# Patient Record
Sex: Female | Born: 1995 | Race: White | Hispanic: No | Marital: Married | Smoking: Never smoker
Health system: Southern US, Community
[De-identification: ages and names within clinical notes are randomized; demographics above are authoritative.]

## PROBLEM LIST (undated history)

## (undated) DIAGNOSIS — F419 Anxiety disorder, unspecified: Secondary | ICD-10-CM

## (undated) DIAGNOSIS — N926 Irregular menstruation, unspecified: Secondary | ICD-10-CM

## (undated) DIAGNOSIS — Z8742 Personal history of other diseases of the female genital tract: Secondary | ICD-10-CM

## (undated) DIAGNOSIS — E079 Disorder of thyroid, unspecified: Secondary | ICD-10-CM

## (undated) DIAGNOSIS — F32A Depression, unspecified: Secondary | ICD-10-CM

## (undated) DIAGNOSIS — T7840XA Allergy, unspecified, initial encounter: Secondary | ICD-10-CM

## (undated) DIAGNOSIS — F329 Major depressive disorder, single episode, unspecified: Secondary | ICD-10-CM

## (undated) HISTORY — DX: Anxiety disorder, unspecified: F41.9

## (undated) HISTORY — DX: Major depressive disorder, single episode, unspecified: F32.9

## (undated) HISTORY — DX: Depression, unspecified: F32.A

## (undated) HISTORY — DX: Disorder of thyroid, unspecified: E07.9

## (undated) HISTORY — DX: Allergy, unspecified, initial encounter: T78.40XA

## (undated) HISTORY — DX: Irregular menstruation, unspecified: N92.6

## (undated) HISTORY — PX: TOTAL THYROIDECTOMY: SHX2547

## (undated) HISTORY — DX: Personal history of other diseases of the female genital tract: Z87.42

---
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
## 2014-12-07 LAB — CBC AND DIFFERENTIAL
HCT: 38 % (ref 36–46)
HEMOGLOBIN: 13.1 g/dL (ref 12.0–16.0)
Neutrophils Absolute: 4 /uL
Platelets: 389 10*3/uL (ref 150–399)
WBC: 7 10^3/mL

## 2014-12-07 LAB — BASIC METABOLIC PANEL
BUN: 13 mg/dL (ref 4–21)
Creatinine: 0.7 mg/dL (ref 0.5–1.1)
Glucose: 85 mg/dL
Potassium: 4.3 mmol/L (ref 3.4–5.3)
Sodium: 140 mmol/L (ref 137–147)

## 2014-12-22 LAB — TSH: TSH: 0 u[IU]/mL — AB (ref 0.41–5.90)

## 2014-12-29 ENCOUNTER — Other Ambulatory Visit: Payer: Self-pay | Admitting: Obstetrics and Gynecology

## 2014-12-29 DIAGNOSIS — E0590 Thyrotoxicosis, unspecified without thyrotoxic crisis or storm: Secondary | ICD-10-CM

## 2014-12-30 ENCOUNTER — Ambulatory Visit: Payer: Self-pay

## 2014-12-30 ENCOUNTER — Ambulatory Visit
Admission: RE | Admit: 2014-12-30 | Discharge: 2014-12-30 | Disposition: A | Discharge status: Home / Self Care | Payer: 59 | Source: Ambulatory Visit | Attending: Obstetrics and Gynecology | Admitting: Obstetrics and Gynecology

## 2014-12-30 DIAGNOSIS — E0590 Thyrotoxicosis, unspecified without thyrotoxic crisis or storm: Secondary | ICD-10-CM | POA: Insufficient documentation

## 2015-01-10 ENCOUNTER — Other Ambulatory Visit: Payer: Self-pay | Admitting: Endocrinology

## 2015-01-10 ENCOUNTER — Encounter: Payer: Self-pay | Admitting: Endocrinology

## 2015-01-10 ENCOUNTER — Ambulatory Visit (INDEPENDENT_AMBULATORY_CARE_PROVIDER_SITE_OTHER): Payer: 59 | Admitting: Endocrinology

## 2015-01-10 VITALS — BP 124/68 | HR 96 | Resp 14 | Ht 68.5 in | Wt 164.8 lb

## 2015-01-10 DIAGNOSIS — N926 Irregular menstruation, unspecified: Secondary | ICD-10-CM | POA: Diagnosis not present

## 2015-01-10 DIAGNOSIS — R946 Abnormal results of thyroid function studies: Secondary | ICD-10-CM | POA: Diagnosis not present

## 2015-01-10 DIAGNOSIS — R7989 Other specified abnormal findings of blood chemistry: Secondary | ICD-10-CM

## 2015-01-10 DIAGNOSIS — Z8639 Personal history of other endocrine, nutritional and metabolic disease: Secondary | ICD-10-CM | POA: Insufficient documentation

## 2015-01-10 LAB — HEPATIC FUNCTION PANEL
ALBUMIN: 4.4 g/dL (ref 3.5–5.2)
ALT: 38 U/L — ABNORMAL HIGH (ref 0–35)
AST: 24 U/L (ref 0–37)
Alkaline Phosphatase: 70 U/L (ref 47–119)
BILIRUBIN TOTAL: 0.6 mg/dL (ref 0.2–1.2)
Bilirubin, Direct: 0.1 mg/dL (ref 0.0–0.3)
TOTAL PROTEIN: 7 g/dL (ref 6.0–8.3)

## 2015-01-10 LAB — T3, FREE: T3 FREE: 8.8 pg/mL — AB (ref 2.3–4.2)

## 2015-01-10 LAB — T4, FREE: Free T4: 2.35 ng/dL — ABNORMAL HIGH (ref 0.60–1.60)

## 2015-01-10 NOTE — Patient Instructions (Signed)
Monitor for symptoms of hyperthyroidism- unintentional weight loss, diarrhea, feeling hot all the time, palpitations. If they occur more frequently, then visit doctor in [place].  Labs today. May consider starting Beta blocker base on labs and do further workup of hyperthyroidism after the trip in July. Please come back for a follow-up appointment in 6 weeks

## 2015-01-10 NOTE — Progress Notes (Signed)
Pre visit review using our clinic review tool, if applicable. No additional management support is needed unless otherwise documented below in the visit note. 

## 2015-01-10 NOTE — Assessment & Plan Note (Signed)
May or may not be related to recent abnormal thyroid levels, given that her periods have always been irregular. Continue BCPs for now.

## 2015-01-10 NOTE — Assessment & Plan Note (Signed)
Discussed with the patient regarding thyroid hormone physiology, symptoms and signs of hyperthyroidism. Discussed possible etiologies for low TSH could indicate early subclinical hyperthyroidism due to Graves disease versus transient changes due to thyroiditis. We also discussed effect of birth control pills on thyroid hormones.  She appears mildly hyperthyroid on exam today.  Update thyroid panel today with thyroid AB levels. Recent thyroid US did not show any nodules.  Patient is visiting [place] for a mission trip for the next 6 weeks. If her thyroid levels are stable as compared to last check, then will consider starting low dose beta blocker given the logistics and lack of time with getting the workup completed with a nuclear diagnostic scan. The workup can then be completed after the trip.   We discussed about possible modalities of treatment for the above conditions including antithyroid medication ( most of the times methimazole), radioactive iodine ablation or surgery.

## 2015-01-10 NOTE — Progress Notes (Signed)
Reason for visit: Low TSH/hyperthyroidism HPI  Debra [name] is a 19 y.o.-year-old female, referred by her PCP,  Ines Bloomer, Melody A, CNM, for evaluation for hyperthyroidism/low TSH. Here with mom.   The patient denies any prior personal history of thyroid problems. She does have family history of thyroid disorder, goiter s/p surgery in pat [place]. she denies any personal XRT exposure to her neck area. Patient denies any recent hospitalization, illness or major change in his medical history. No recent medication changes. Denies tenderness in neck area.  Denies any recent ingestion of seaweed/kelp or recent exposure to iv contrast dye. No recent steroid use or use of herbal supplements.    She started having fatigue, mood swings recently, lately in last 1 year, which were initially attributed to depression but when symptoms persisted this Spring, then labs checked and TSH was found to be suppressed x 2. Initial set of labs were done prior to birth control pills.Labs were repeated in early May, I don't have those results. She has always had irreg periods , and was recently started on BCPs 5 weeks ago to regulate them. LMP March 2016. Other symptoms include palps with anxiety, for which she uses occasional xanax   I reviewed pt's thyroid tests: Lab Results  Component Value Date   TSH 0.00* 12/22/2014    12/08/14: TSH 0.006, TT4 11.5, T3U 33%,FTI 3.8 TgAB<1 Remainder panel was normal for E2, Progesterone, PRL, testosterone  Thyroid US, [facility], 12/30/14 CLINICAL DATA: Hyperthyroidism  EXAM: THYROID ULTRASOUND  TECHNIQUE: Ultrasound examination of the thyroid gland and adjacent soft tissues was performed.  COMPARISON: None.  FINDINGS: Right thyroid lobe  Measurements: 4.0 x 1.3 x 1.3 cm. No nodules visualized. Mildly heterogeneous tissue.  Left thyroid lobe  Measurements: 4.3 x 1.0 x 1.3 cm. No nodules visualized. Mildly heterogeneous tissue.  Isthmus  Thickness: 3 mm.  No nodules visualized.  Lymphadenopathy  None visualized.  IMPRESSION: No evidence of thyroid nodule. Mildly heterogeneous glandular tissue. Normal size.   Other pertinent labs are as follows: No results found for: AST No results found for: ALT  Review of systems:  [ x ] complains of   [  ] denies  [  ] weight loss [  ]  tremors [ x ] palpitations -occ [  ] diarrhea [  ] increased anxiety [  ] muscle weakness  [  ] heat intolerance [ x ] fatigue  [  ] proptosis [  ] problems with eye closure or color vision. [  ] noticing any enlargement in size of thyroid [  ] lumps in neck [  ] dysphagia  [  ] change in voice [  ]  SOB when lying down  FH of paternal great-GM- goiter and then surgery I reviewed her chart and she also has a history of depression. I have reviewed the patient's past medical history, family and social history, surgical history, medications and allergies.  Past Medical History  Diagnosis Date  . Anxiety   . Depression   . Thyroid disease   . History of irregular menstrual cycles    Past Surgical History  Procedure Laterality Date  . Tonsillectomy and adenoidectomy  2012   Family History  Problem Relation Age of Onset  . Thyroid disease Other    History   Social History  . Marital Status: Single    Spouse Name: N/A  . Number of Children: N/A  . Years of Education: N/A   Occupational History  . Not on  file.   Social History Main Topics  . Smoking status: Never Smoker   . Smokeless tobacco: Never Used  . Alcohol Use: Yes  . Drug Use: No  . Sexual Activity: Not Currently   Other Topics Concern  . Not on file   Social History Narrative   No current outpatient prescriptions on file prior to visit.   No current facility-administered medications on file prior to visit.   Allergies  Allergen Reactions  . Sulfa Antibiotics Rash      ROS: Review of Systems:  complains of  [  ] denies General:   [  ] Recent weight change [ x ]  Fatigue  [  ] Loss of appetite Eyes: [  ]  Vision Difficulty [  ]  Eye pain ENT: [  ]  Hearing difficulty [  ]  Difficulty Swallowing CVS: [  ] Chest pain [ x ]  Palpitations/Irregular Heart beat [  ]  Shortness of breath lying flat [  ] Swelling of legs Resp: [  ] Frequent Cough [  x] Shortness of Breath  [  ]  Wheezing GI: [  ] Heartburn  [  ] Nausea or Vomiting  [  ] Diarrhea [  ] Constipation  [  ] Abdominal Pain GU: [  ]  Polyuria  [  ]  nocturia Bones/joints:  [  ]  Muscle aches  [  ] Joint Pain  [  ] Bone pain Skin/Hair/Nails: [  ]  Rash  [  ] New stretch marks [  ]  Itching [  ] Hair loss [  ]  Excessive hair growth Reproduction: [  ] Low sexual desire , [ x ]  Women: Menstrual cycle problems [  ]  Women: Breast Discharge [  ] Men: Difficulty with erections [  ]  Men: Enlarged Breasts CNS: [ x ] Frequent Headaches [  ] Blurry vision [  ] Tremors [  ] Seizures [  ] Loss of consciousness [  ] Localized weakness Endocrine: [  ]  Excess thirst [  ]  Feeling excessively hot [  ]  Feeling excessively cold Heme: [  ]  Easy bruising [  ]  Enlarged glands or lumps in neck Allergy: [  ]  Food allergies [  ] Environmental allergies  PE: BP 124/68 mmHg  Pulse 96  Resp 14  Ht 5' 8.5" (1.74 m)  Wt 164 lb 12 oz (74.73 kg)  BMI 24.68 kg/m2  SpO2 97% Wt Readings from Last 3 Encounters:  01/10/15 164 lb 12 oz (74.73 kg) (90 %*, Z = 1.26)   * Growth percentiles are based on CDC 2-20 Years data.    HEENT: [place]/AT, EOMI, no icterus, no proptosis, no chemosis, no mild lid lag, no retraction, eyes close completely Neck: thyroid gland - smooth, non-tender, no erythema, no tracheal deviation; negative Pemberton's sign; no lymphadenopathy; + bilateral thyroid bruits Lungs: good air entry, clear bilaterally Heart: S1&S2 normal, regular rate & rhythm; no murmurs, rubs or gallops Abd: soft, NT, ND, no HSM, +BS Ext: fine tremor in hands bilaterally, no edema, 2+ DP/PT pulses, good muscle mass Neuro:  normal gait, 2+ reflexes bilaterally, normal 5/5 strength, no proximal myopathy  Derm: no pretibial myxoedema/skin dryness   ASSESSMENT: 1. Low TSH/Hyperthyroidism   Problem List Items Addressed This Visit      Other   Low TSH level - Primary    Discussed with the patient regarding thyroid hormone physiology,  symptoms and signs of hyperthyroidism. Discussed possible etiologies for low TSH could indicate early subclinical hyperthyroidism due to Graves disease versus transient changes due to thyroiditis. We also discussed effect of birth control pills on thyroid hormones.  She appears mildly hyperthyroid on exam today.  Update thyroid panel today with thyroid AB levels. Recent thyroid US did not show any nodules.  Patient is visiting [place] for a mission trip for the next 6 weeks. If her thyroid levels are stable as compared to last check, then will consider starting low dose beta blocker given the logistics and lack of time with getting the workup completed with a nuclear diagnostic scan. The workup can then be completed after the trip.   We discussed about possible modalities of treatment for the above conditions including antithyroid medication ( most of the times methimazole), radioactive iodine ablation or surgery.        Relevant Orders   T3, free   T4, free   Thyroid Profile II   Hepatic function panel   Thyroid stimulating immunoglobulin   Thyroid peroxidase antibody   Irregular menses    May or may not be related to recent abnormal thyroid levels, given that her periods have always been irregular. Continue BCPs for now.       Relevant Orders   T3, free   T4, free   Thyroid Profile II   Hepatic function panel   Thyroid stimulating immunoglobulin   Thyroid peroxidase antibody     -RTC 6 weeks     Kamdin Follett [facility] 01/10/2015  1:58 PM

## 2015-01-11 ENCOUNTER — Telehealth: Payer: Self-pay

## 2015-01-11 ENCOUNTER — Other Ambulatory Visit (INDEPENDENT_AMBULATORY_CARE_PROVIDER_SITE_OTHER): Payer: 59

## 2015-01-11 ENCOUNTER — Telehealth: Payer: Self-pay | Admitting: Obstetrics and Gynecology

## 2015-01-11 DIAGNOSIS — N926 Irregular menstruation, unspecified: Secondary | ICD-10-CM | POA: Diagnosis not present

## 2015-01-11 DIAGNOSIS — R946 Abnormal results of thyroid function studies: Secondary | ICD-10-CM | POA: Diagnosis not present

## 2015-01-11 LAB — THYROID PROFILE II
FREE THYROXINE INDEX: 5.3 — AB (ref 1.2–4.9)
T3 TOTAL: 440 ng/dL — AB (ref 71–180)
T3 UPTAKE RATIO: 30 % (ref 24–39)
T4, Total: 17.6 ug/dL — ABNORMAL HIGH (ref 4.5–12.0)
TSH: 0.005 u[IU]/mL — AB (ref 0.450–4.500)

## 2015-01-11 LAB — THYROID PEROXIDASE ANTIBODY: THYROID PEROXIDASE ANTIBODY: 3 [IU]/mL (ref <9)

## 2015-01-11 LAB — HCG, QUANTITATIVE, PREGNANCY: QUANTITATIVE HCG: 0.21 m[IU]/mL

## 2015-01-11 NOTE — Telephone Encounter (Signed)
Could you add on a serum HCG test ( qualitative) to her labs from 2 days ago. If they cant be added on to the sample that we have here- then please add on [facility]. Thanks  Also the patient hasn't called us back regarding confirmation for this test to be done for tomorrow. Please could you reach her this morning and confirm that she will come.   Ask her to avoid all kinds of salt and salty foods including Congohinese for today and tomorrow.

## 2015-01-11 NOTE — Telephone Encounter (Signed)
Notified patient of appointment time and place.

## 2015-01-11 NOTE — Telephone Encounter (Signed)
Patient's mother called back and is now interested in going through with the diagnostic testing tomorrow morning.  If possible, she would like to speak with you about some concerns around the testing anytime after 3:30 today. [phone].

## 2015-01-11 NOTE — Telephone Encounter (Signed)
Neg preg test

## 2015-01-11 NOTE — Telephone Encounter (Signed)
Verbal consent obtained from [place] regarding permission to talk to her mom regarding this. LMOM for mom to call back.

## 2015-01-11 NOTE — Telephone Encounter (Signed)
[facility] nuclear med called and stated the patient would need a serum pregnancy test done before the order of her thyroid uptake could be completed.   Callback - [phone] (Addie)

## 2015-01-11 NOTE — Telephone Encounter (Signed)
Patient's mother is concerned about her daughter's results from yesterday.  Mother has a few questions regarding, 1. You mentioned a beta blocker yesterday as a possible treatment method, is that only after the nuclear scan or is that something you would start regardless? 2. Is the nuclear scan needed to be done now this week or is this something that can wait until she returns in 6 weeks?  Mom feels like they haven't given her new birth control pill time to assist yet and is nervous about adding more medications to her plans while she is out of the country.  Wants some clarity on if they wait, will it make a big difference or is it more a choice?  She wants your opinion.  Thanks Please advise.

## 2015-01-11 NOTE — Telephone Encounter (Signed)
Called Patient's mother back, see telephone note for details.

## 2015-01-11 NOTE — Telephone Encounter (Signed)
Spoke to mom at length. Verbal consent had been obtained by me earlier from the patient. 1. Pt has hyperthyroidism on labs. There is worsening of thyroid levels as compared to test done April 2016.  2. Usually diagnostic testing is without complications, cant predict allergies, but usually no major side effects 3. Options would be to workup this further now or start treatment and then consider diagnostic scan after trip 4. Wouldn't recommend to wait and watch without any treatment as thyroid levels have worsened, and I cant predict her trajectory over next 6 weeks without treatment. Plus dont know too much about her health insurance and access to care while in [place].  5. Discussed protocol for diagnostic scan. Following the results, will likely start her on methimazole and BB 6. We will inform patient about details of where she needs to go.  Denisa spoke to patient and she is agreeable to proceed with testing 7. Okay for grandparent to be with patient while testing 8. Precertification done by me through peer to peer review. coapy costs and such will be determined by her plan. 9. Usually nuc scan is a one time testing modality.

## 2015-01-11 NOTE — Telephone Encounter (Signed)
Awaiting confirmation from pt/mom regarding testing- see other notes

## 2015-01-11 NOTE — Telephone Encounter (Signed)
Pt mother called to ask questions about pt care for blood work.

## 2015-01-11 NOTE — Telephone Encounter (Signed)
Spoke with [facility] and test can be added on.  We will need to fax add on sheet with Dx code.

## 2015-01-12 ENCOUNTER — Encounter
Admission: RE | Admit: 2015-01-12 | Discharge: 2015-01-12 | Disposition: A | Discharge status: Home / Self Care | Payer: 59 | Source: Ambulatory Visit | Attending: Endocrinology | Admitting: Endocrinology

## 2015-01-12 ENCOUNTER — Inpatient Hospital Stay: Admission: RE | Admit: 2015-01-12 | Payer: 59 | Source: Ambulatory Visit

## 2015-01-12 ENCOUNTER — Encounter: Admission: RE | Admit: 2015-01-12 | Payer: 59 | Source: Ambulatory Visit

## 2015-01-12 DIAGNOSIS — R7989 Other specified abnormal findings of blood chemistry: Secondary | ICD-10-CM

## 2015-01-12 DIAGNOSIS — R946 Abnormal results of thyroid function studies: Secondary | ICD-10-CM | POA: Insufficient documentation

## 2015-01-12 MED ORDER — SODIUM IODIDE I-123 3.7 MBQ PO CAPS
148.3000 | ORAL_CAPSULE | Freq: Once | ORAL | Status: DC
  Filled 2015-01-12: qty 1

## 2015-01-13 ENCOUNTER — Encounter
Admission: RE | Admit: 2015-01-13 | Discharge: 2015-01-13 | Disposition: A | Discharge status: Home / Self Care | Payer: 59 | Source: Ambulatory Visit | Attending: Endocrinology | Admitting: Endocrinology

## 2015-01-13 ENCOUNTER — Other Ambulatory Visit: Payer: 59

## 2015-01-13 ENCOUNTER — Other Ambulatory Visit: Payer: Self-pay | Admitting: Endocrinology

## 2015-01-13 ENCOUNTER — Telehealth: Payer: Self-pay | Admitting: Obstetrics and Gynecology

## 2015-01-13 DIAGNOSIS — R946 Abnormal results of thyroid function studies: Secondary | ICD-10-CM | POA: Diagnosis present

## 2015-01-13 MED ORDER — METHIMAZOLE 10 MG PO TABS: 10.0000 mg | ORAL_TABLET | Freq: Every day | ORAL | Status: DC

## 2015-01-13 NOTE — Telephone Encounter (Signed)
Patient called and would like results if available on her nuclear scan yesterday.

## 2015-01-13 NOTE — Telephone Encounter (Signed)
Called patient to notify her that results were not available yet per Dr. Welford RochePhadke. Patient verbalized understanding. Explained to patient as soon as we have the results she will be contacted by phone even if its after hours. Dr. Welford RochePhadke is aware that patient will be leaving the country next week and need the results as soon as possible.

## 2015-01-13 NOTE — Telephone Encounter (Signed)
No official result yet.will let her know when they are out. thanks

## 2015-01-13 NOTE — Telephone Encounter (Signed)
Pt wanted to get test result. Please advise pt/msn

## 2015-01-15 LAB — THYROID STIMULATING IMMUNOGLOBULIN: TSI: 588 % baseline — ABNORMAL HIGH (ref <140)

## 2015-01-17 ENCOUNTER — Other Ambulatory Visit: Payer: 59

## 2015-02-22 ENCOUNTER — Ambulatory Visit: Payer: 59 | Admitting: Endocrinology

## 2015-02-22 ENCOUNTER — Telehealth: Payer: Self-pay | Admitting: Obstetrics and Gynecology

## 2015-02-22 NOTE — Telephone Encounter (Signed)
Pt called and she went to the endocrinologist and she was DX with graves disease and hypothyroid, she went to the practice were her endocrinologist was and he dr had left so she isn't sure what she needs to do. So she needs to know if we can set her up with another endocrinologist Dr, and she also wanted to talk to you about some other things.

## 2015-02-23 ENCOUNTER — Telehealth: Payer: Self-pay | Admitting: Obstetrics and Gynecology

## 2015-02-23 NOTE — Telephone Encounter (Signed)
I called [facility] and got patient scheduled with Dr Elvera LennoxGherghe 04/11/15 at 1:15. That is there first available. Patient wants to know what she should do until then. While she was on vacation she was having severe panic attacks. Please Advise.

## 2015-02-23 NOTE — Telephone Encounter (Signed)
She can call and ask to see replacing endocrinologist at [facility] since she is established there.  I would recommend staying there.

## 2015-02-23 NOTE — Telephone Encounter (Signed)
Patient called stating she is having problems with her endocrinologist.  She was seen by Dr Welford RochePhadke before she went on vacation and returned after to see the doctor and she had left [place]. Patient stated they did not set her up with another physician there. She would be ok staying with [facility] or going to [facility], just whomever can get her in first.

## 2015-02-27 ENCOUNTER — Telehealth: Payer: Self-pay | Admitting: Internal Medicine

## 2015-02-27 NOTE — Telephone Encounter (Signed)
Notified pt of appt, states she has been having increased anxiety attacks, pls advise

## 2015-02-27 NOTE — Telephone Encounter (Signed)
Called and spoke with pt's mother. Pt saw Dr Welford RochePhadke in May. Pt left for a missions trip to [place] and has just returned. Pt's sx caused her to have to see a physician while in [place]. Pt has racing heart, is jittery, shaky hands and real hot at times. Mother feels that the pt's medication needs to be adjusted. She wanted her to see Dr Elvera LennoxGherghe as soon as possible to establish with her and to have labs done. Scheduled pt to be seen on Wed, 7/13 at 10:00 am. Be advised.

## 2015-02-27 NOTE — Telephone Encounter (Signed)
Pt mother called concerns about pt and some issues pt is having and also no medicatoin please call mother back asap at [phone]

## 2015-02-28 NOTE — Telephone Encounter (Signed)
Please have her make an appt to come in to discuss med for anxiety

## 2015-03-01 ENCOUNTER — Encounter: Payer: Self-pay | Admitting: Internal Medicine

## 2015-03-01 ENCOUNTER — Ambulatory Visit (INDEPENDENT_AMBULATORY_CARE_PROVIDER_SITE_OTHER): Payer: 59 | Admitting: Internal Medicine

## 2015-03-01 VITALS — BP 108/62 | HR 100 | Temp 98.3°F | Resp 12 | Wt 171.6 lb

## 2015-03-01 DIAGNOSIS — R7989 Other specified abnormal findings of blood chemistry: Secondary | ICD-10-CM

## 2015-03-01 DIAGNOSIS — R946 Abnormal results of thyroid function studies: Secondary | ICD-10-CM | POA: Diagnosis not present

## 2015-03-01 LAB — TSH: TSH: 0.19 u[IU]/mL — AB (ref 0.40–5.00)

## 2015-03-01 LAB — T4, FREE: Free T4: 1.75 ng/dL — ABNORMAL HIGH (ref 0.60–1.60)

## 2015-03-01 LAB — T3, FREE: T3, Free: 6.7 pg/mL — ABNORMAL HIGH (ref 2.3–4.2)

## 2015-03-01 MED ORDER — ATENOLOL 25 MG PO TABS: 25.0000 mg | ORAL_TABLET | Freq: Two times a day (BID) | ORAL | Status: DC

## 2015-03-01 NOTE — Progress Notes (Signed)
Patient ID: Debra [name], female   DOB: September 01, 1995, 19 y.o.   MRN: [id number]    HPI  Debra [name] is a 19 y.o.-year-old female, returning to [facility] (previously seen by Dr Welford RochePhadke), for follow up for Graves ds.  She started to have mood swings and anxiety episodes in 2014. More recently having heat intolerance. She has been having tremors for the last 3 years. She started to have heart racing, palpitations, fatigue, feeling hot in the last few months.  She started MMI 10 mg in am on 01/14/2015.   In the last month, she was in [place] >> had 3 panic attacks there.   I reviewed pt's thyroid tests: 01/10/2015: [facility] Lab Results  Component Value Date   TSH 0.005* 01/10/2015   TSH 0.00* 12/22/2014   FREET4 2.35* 01/10/2015    12/08/14: TSH 0.006, TT4 11.5, T3U 33%,FTI 3.8 TgAB<1 Remainder panel was normal for E2, Progesterone, PRL, testosterone  Thyroid US, [facility] (12/30/14) - normal, except heterogeneity: Right thyroid lobe Measurements: 4.0 x 1.3 x 1.3 cm. No nodules visualized. Mildly heterogeneous tissue.  Left thyroid lobe Measurements: 4.3 x 1.0 x 1.3 cm. No nodules visualized. Mildly heterogeneous tissue.  Isthmus Thickness: 3 mm. No nodules visualized.  Lymphadenopathy: None visualized.  IMPRESSION: No evidence of thyroid nodule. Mildly heterogeneous glandular tissue. Normal size.  Thyroid Uptake and scan (01/13/2015): The thyroid scan demonstrates fairly symmetric and homogeneous uptake in the thyroid gland. No hot or cold nodules are demonstrated.  4 hour I 131 uptake = 51.2% (normal 5-20%)  24 hour I 131 uptake = 55.8% (normal 10-30%)  IMPRESSION: Elevated 4 and 24 hr iodine 123 uptake consistent with hyperthyroidism/Graves disease.  Pt denies feeling nodules in neck, hoarseness, dysphagia/odynophagia, SOB with lying down; she c/o: - + fatigue - + excessive sweating/heat intolerance - + tremors - + anxiety - + palpitations - no  hyperdefecation - no weight loss - no hair loss  Pt does have a FH of thyroid ds. - hyper- or hypo-thyroidism in [place]. No FH of thyroid cancer. No h/o radiation tx to head or neck.  No seaweed or kelp, no recent contrast studies. No steroid use. No herbal supplements. No Biotin use.  I reviewed her chart and she also has a history of irregular menses.  ROS: Constitutional: see HPI Eyes: no blurry vision, no xerophthalmia ENT: no sore throat, no nodules palpated in throat, no dysphagia/odynophagia, no hoarseness Cardiovascular: + CP when anxious/+ SOB when anxious/+ palpitations/+ leg swelling Respiratory: no cough/+ SOB Gastrointestinal: no N/V/D/C Musculoskeletal: no muscle/joint aches Skin: no rashes Neurological: + tremors/no numbness/tingling/dizziness Psychiatric: no depression/+++anxiety + irreg menses Past Medical History  Diagnosis Date  . Anxiety   . Depression   . Thyroid disease   . History of irregular menstrual cycles    Past Surgical History  Procedure Laterality Date  . Tonsillectomy and adenoidectomy  2012   History   Social History  . Marital Status: Single    Spouse Name: N/A  . Number of Children: 0   Occupational History  . student   Social History Main Topics  . Smoking status: Never Smoker   . Smokeless tobacco: Never Used  . Alcohol Use: Yes  . Drug Use: No   Current Outpatient Prescriptions on File Prior to Visit  Medication Sig Dispense Refill  . methimazole (TAPAZOLE) 10 MG tablet Take 1 tablet (10 mg total) by mouth daily. 60 tablet 1  . Norethindrone-Ethinyl Estradiol-Fe Biphas (LO LOESTRIN FE) 1  MG-10 MCG / 10 MCG tablet Take 1 tablet by mouth daily.     No current facility-administered medications on file prior to visit.   Allergies  Allergen Reactions  . Sulfa Antibiotics Rash   Family History  Problem Relation Age of Onset  . Thyroid disease Other     PE: BP 108/62 mmHg  Pulse 100  Temp(Src) 98.3 F (36.8 C) (Oral)   Resp 12  Wt 171 lb 9.6 oz (77.837 kg)  SpO2 95% Body mass index is 25.71 kg/(m^2). Wt Readings from Last 3 Encounters:  03/01/15 171 lb 9.6 oz (77.837 kg) (92 %*, Z = 1.42)  01/10/15 164 lb 12 oz (74.73 kg) (90 %*, Z = 1.26)   * Growth percentiles are based on CDC 2-20 Years data.   Constitutional: normal weight, in NAD Eyes: PERRLA, EOMI, no exophthalmos, no lid lag, no stare ENT: moist mucous membranes, no thyromegaly, no thyroid bruits, no cervical lymphadenopathy Cardiovascular: tachycardia, RR, No MRG, + mild periankle edema, nonpitting Respiratory: CTA B Gastrointestinal: abdomen soft, NT, ND, BS+ Musculoskeletal: no deformities, strength intact in all 4 Skin: moist, warm, no rashes Neurological: + tremor with outstretched hands, DTR normal in all 4  ASSESSMENT: 1. Graves ds  PLAN:  1. Patient with a recently dx'ed Graves ds., with thyrotoxic sxs: fatigue, heat intolerance, tremors, palpitations, anxiety.  - I reviewed all her imaging and blood tests with pt and her mother >> explained what Graves ds means (autoimmune ds) - I suggested that we check the TSH, fT3 and fT4 today - we discussed about possible modalities of treatment for the above conditions, to include methimazole use, radioactive iodine ablation or (last resort) surgery. Will continue MMI for now >> switch to 5 mg bid. - we need to add a beta blocker (Atenolol 25 mg bid) today since she is tachycardic, anxious, or tremulous - I advised her to join my chart to communicate easier - RTC in 3 months, but likely sooner for repeat labs  - time spent with the patient: 40 min, of which >50% was spent in obtaining information about her symptoms, reviewing her previous labs, evaluations, and treatments, counseling her about her condition (please see the discussed topics above), and developing a plan to further investigate and treat it; she and her mom had a number of questions which I addressed.  Office Visit on 03/01/2015   Component Date Value Ref Range Status  . TSH 03/01/2015 0.19* 0.40 - 5.00 uIU/mL Final  . Free T4 03/01/2015 1.75* 0.60 - 1.60 ng/dL Final  . T3, Free 16/05/9603 6.7* 2.3 - 4.2 pg/mL Final   Thyroid tests much improved on the current dose of methimazole. We added a beta blocker, and for now, will continue 5 mg methimazole twice a day and recheck her thyroid test in a month. At that point, we may need to increase the dose. I would not recommend radioactive iodine for now since she appears to respond well to methimazole, even on this low dose.

## 2015-03-01 NOTE — Patient Instructions (Signed)
Please stop at the lab.  Please continue Methimazole but change to 5 mg 2x a day, with meals.   Start Atenolol 25 mg 2x a day.  Hyperthyroidism The thyroid is a large gland located in the lower front part of your neck. The thyroid helps control metabolism. Metabolism is how your body uses food. It controls metabolism with the hormone thyroxine. When the thyroid is overactive, it produces too much hormone. When this happens, these following problems may occur:   Nervousness  Heat intolerance  Weight loss (in spite of increase food intake)  Diarrhea  Change in hair or skin texture  Palpitations (heart skipping or having extra beats)  Tachycardia (rapid heart rate)  Loss of menstruation (amenorrhea)  Shaking of the hands CAUSES  Grave's Disease (the immune system attacks the thyroid gland). This is the most common cause.  Inflammation of the thyroid gland.  Tumor (usually benign) in the thyroid gland or elsewhere.  Excessive use of thyroid medications (both prescription and 'natural').  Excessive ingestion of Iodine. DIAGNOSIS  To prove hyperthyroidism, your caregiver may do blood tests and ultrasound tests. Sometimes the signs are hidden. It may be necessary for your caregiver to watch this illness with blood tests, either before or after diagnosis and treatment. TREATMENT Short-term treatment There are several treatments to control symptoms. Drugs called beta blockers may give some relief. Drugs that decrease hormone production will provide temporary relief in many people. These measures will usually not give permanent relief. Definitive therapy There are treatments available which can be discussed between you and your caregiver which will permanently treat the problem. These treatments range from surgery (removal of the thyroid), to the use of radioactive iodine (destroys the thyroid by radiation), to the use of antithyroid drugs (interfere with hormone synthesis). The  first two treatments are permanent and usually successful. They most often require hormone replacement therapy for life. This is because it is impossible to remove or destroy the exact amount of thyroid required to make a person euthyroid (normal). HOME CARE INSTRUCTIONS  See your caregiver if the problems you are being treated for get worse. Examples of this would be the problems listed above. SEEK MEDICAL CARE IF: Your general condition worsens. MAKE SURE YOU:   Understand these instructions.  Will watch your condition.  Will get help right away if you are not doing well or get worse. Document Released: 08/05/2005 Document Revised: 10/28/2011 Document Reviewed: 12/17/2006 [facility] Patient Information 2015 [place]. This information is not intended to replace advice given to you by your health care provider. Make sure you discuss any questions you have with your health care provider.

## 2015-03-02 ENCOUNTER — Other Ambulatory Visit: Payer: Self-pay | Admitting: Internal Medicine

## 2015-03-02 DIAGNOSIS — E0500 Thyrotoxicosis with diffuse goiter without thyrotoxic crisis or storm: Secondary | ICD-10-CM

## 2015-03-02 MED ORDER — METHIMAZOLE 10 MG PO TABS: 5.0000 mg | ORAL_TABLET | Freq: Two times a day (BID) | ORAL | Status: DC

## 2015-03-09 ENCOUNTER — Encounter: Payer: Self-pay | Admitting: *Deleted

## 2015-03-14 ENCOUNTER — Ambulatory Visit: Payer: Self-pay | Admitting: Obstetrics and Gynecology

## 2015-03-24 ENCOUNTER — Ambulatory Visit (INDEPENDENT_AMBULATORY_CARE_PROVIDER_SITE_OTHER): Payer: 59 | Admitting: Obstetrics and Gynecology

## 2015-03-24 ENCOUNTER — Encounter: Payer: Self-pay | Admitting: Obstetrics and Gynecology

## 2015-03-24 VITALS — BP 120/82 | HR 88 | Ht 69.0 in | Wt 168.3 lb

## 2015-03-24 DIAGNOSIS — F418 Other specified anxiety disorders: Secondary | ICD-10-CM | POA: Diagnosis not present

## 2015-03-24 DIAGNOSIS — F064 Anxiety disorder due to known physiological condition: Secondary | ICD-10-CM

## 2015-03-24 DIAGNOSIS — N926 Irregular menstruation, unspecified: Secondary | ICD-10-CM

## 2015-03-24 MED ORDER — NORETHIN-ETH ESTRAD-FE BIPHAS 1 MG-10 MCG / 10 MCG PO TABS: 1.0000 | ORAL_TABLET | Freq: Every day | ORAL | Status: DC

## 2015-03-24 NOTE — Progress Notes (Signed)
Subjective:     Patient ID: Debra [name], female   DOB: 1996/06/15, 19 y.o.   MRN: [id number]  HPI Diagnosed with hyperthyroidism earlier this year, is managed by endocrinologist; has been taking OCP for cycle control x 3 months. Here for follow-up on medication.  Review of Systems Reports no menses since starting Lo Lo Estrin and no unwanted side-effects. Happy with pill.   Does report weight fluctuations of 10-15 lbs with no changes in exercise or eating since thyroid disorder was diagnosed.  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   Also reports worsening anxiety over the last few months, now occuring daily, feels like insides are jittery and like she can't relax. Was started on atenolol 2 weeks ago to help with it but not feeling any different. States she has always been anxious when stressed, but now it is all the time. Sleeping not affected. Is able to preform ADLs and be in social situations as normal.  Is leaving for college in 1 week and concerned as to how she when adjust with the anxiety so present and the thyroiditis undergoing treatment.  Did discuss with endocrinologist.    Objective:   Physical Exam A&O x4 Well groomed female in no distress Able to articulate well with out suicidal ideation. Good recall of past and current events.     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  Debra [name], CNM

## 2015-03-24 NOTE — Patient Instructions (Signed)
Generalized Anxiety Disorder Generalized anxiety disorder (GAD) is a mental disorder. It interferes with life functions, including relationships, work, and school. GAD is different from normal anxiety, which everyone experiences at some point in their lives in response to specific life events and activities. Normal anxiety actually helps us prepare for and get through these life events and activities. Normal anxiety goes away after the event or activity is over.  GAD causes anxiety that is not necessarily related to specific events or activities. It also causes excess anxiety in proportion to specific events or activities. The anxiety associated with GAD is also difficult to control. GAD can vary from mild to severe. People with severe GAD can have intense waves of anxiety with physical symptoms (panic attacks).  SYMPTOMS The anxiety and worry associated with GAD are difficult to control. This anxiety and worry are related to many life events and activities and also occur more days than not for 6 months or longer. People with GAD also have three or more of the following symptoms (one or more in children):  Restlessness.   Fatigue.  Difficulty concentrating.   Irritability.  Muscle tension.  Difficulty sleeping or unsatisfying sleep. DIAGNOSIS GAD is diagnosed through an assessment by your health care provider. Your health care provider will ask you questions aboutyour mood,physical symptoms, and events in your life. Your health care provider may ask you about your medical history and use of alcohol or drugs, including prescription medicines. Your health care provider may also do a physical exam and blood tests. Certain medical conditions and the use of certain substances can cause symptoms similar to those associated with GAD. Your health care provider may refer you to a mental health specialist for further evaluation. TREATMENT The following therapies are usually used to treat GAD:    Medication. Antidepressant medication usually is prescribed for long-term daily control. Antianxiety medicines may be added in severe cases, especially when panic attacks occur.   Talk therapy (psychotherapy). Certain types of talk therapy can be helpful in treating GAD by providing support, education, and guidance. A form of talk therapy called cognitive behavioral therapy can teach you healthy ways to think about and react to daily life events and activities.  Stress managementtechniques. These include yoga, meditation, and exercise and can be very helpful when they are practiced regularly. A mental health specialist can help determine which treatment is best for you. Some people see improvement with one therapy. However, other people require a combination of therapies. Document Released: 11/30/2012 Document Revised: 12/20/2013 Document Reviewed: 11/30/2012 Exit[employer] Patient Information 2015 [employer]. This information is not intended to replace advice given to you by your health care provider. Make sure you discuss any questions you have with your health care provider.  

## 2015-04-11 ENCOUNTER — Ambulatory Visit: Payer: 59 | Admitting: Internal Medicine

## 2015-04-26 ENCOUNTER — Telehealth: Payer: Self-pay | Admitting: Internal Medicine

## 2015-04-26 NOTE — Telephone Encounter (Signed)
Please leave message below.

## 2015-04-26 NOTE — Telephone Encounter (Signed)
Pt called wanting to talk to dr Elvera Lennox, says she having issues with her diagnosis and would like to talk to her [phone]

## 2015-04-27 NOTE — Telephone Encounter (Signed)
She has a diagnosis of Graves' disease, which is an autoimmune disorder which stimulate the thyroid to produce excessive levels of thyroid hormones. Can you please find out what her questions are and I will do my best to answer them.

## 2015-04-27 NOTE — Telephone Encounter (Signed)
Returned pt's call. Pt stated that her sx have been escalating. She has been exhausted and her anxiety gotten worse. Advised pt that she needs to have her thyroid labs done. Pt to go to [place] lab tomorrow and do labs. Be advised.

## 2015-04-28 ENCOUNTER — Other Ambulatory Visit (INDEPENDENT_AMBULATORY_CARE_PROVIDER_SITE_OTHER): Payer: 59

## 2015-04-28 ENCOUNTER — Other Ambulatory Visit: Payer: Self-pay | Admitting: Internal Medicine

## 2015-04-28 DIAGNOSIS — E0500 Thyrotoxicosis with diffuse goiter without thyrotoxic crisis or storm: Secondary | ICD-10-CM

## 2015-04-28 LAB — T3, FREE: T3, Free: 8.3 pg/mL — ABNORMAL HIGH (ref 2.3–4.2)

## 2015-04-28 LAB — TSH: TSH: 0.14 u[IU]/mL — AB (ref 0.40–5.00)

## 2015-04-28 LAB — T4, FREE: Free T4: 2.34 ng/dL — ABNORMAL HIGH (ref 0.60–1.60)

## 2015-04-28 MED ORDER — METHIMAZOLE 10 MG PO TABS: 10.0000 mg | ORAL_TABLET | Freq: Two times a day (BID) | ORAL | Status: DC

## 2015-05-16 ENCOUNTER — Telehealth: Payer: Self-pay | Admitting: Internal Medicine

## 2015-05-16 NOTE — Telephone Encounter (Signed)
Please read message below.  

## 2015-05-16 NOTE — Telephone Encounter (Signed)
Mom is calling and would like some follow up on what might be best for her daughter to help with her anxiety  Anxiety meds were discussed with Aleece before she was even diagnosed with Graves disease, if this could be what is needed what is the mildest form of anxiety meds she could take

## 2015-05-16 NOTE — Telephone Encounter (Signed)
Please check with PCP, I am not usually managing anxiety and I am not completely familiar with the meds.

## 2015-05-16 NOTE — Telephone Encounter (Signed)
Called pt's mother and advised her per Dr Charlean Sanfilippo message below.

## 2015-05-16 NOTE — Telephone Encounter (Signed)
I think from the thyroid point of view, she should feel better, especially after increasing the methimazole at last visit. Please make sure that she is continuing her beta blocker twice a day. She may benefit from anxiety medication, not long-term, but at least until we get her thyroid tests under control, since there are not a lot of other things that we can do for her thyroid at this time. I would check with her PCP for anti-anxiety medicines. Please also advised her to come for another thyroid check around the middle of October, but it is too soon to recheck now.

## 2015-05-16 NOTE — Telephone Encounter (Signed)
Called pt and advised her per Dr Charlean Sanfilippo message below. Pt stated she is feeling better. She stated she can't sleep at night, she gets short of breath and her heart is racing at times. She does have some anti-anxiety meds she can take. Pt will call back and advise what the medication is. Be advised.

## 2015-05-16 NOTE — Telephone Encounter (Signed)
Pt is experiencing bad anxiety, she feels like she cant catch her breath, she feels like her heart is racing. Please advise

## 2015-05-31 ENCOUNTER — Other Ambulatory Visit (INDEPENDENT_AMBULATORY_CARE_PROVIDER_SITE_OTHER): Payer: 59

## 2015-05-31 DIAGNOSIS — E0500 Thyrotoxicosis with diffuse goiter without thyrotoxic crisis or storm: Secondary | ICD-10-CM | POA: Diagnosis not present

## 2015-05-31 LAB — TSH: TSH: 0.2 u[IU]/mL — ABNORMAL LOW (ref 0.35–5.50)

## 2015-05-31 LAB — T4, FREE: Free T4: 2.47 ng/dL — ABNORMAL HIGH (ref 0.60–1.60)

## 2015-05-31 LAB — T3, FREE: T3, Free: 10 pg/mL — ABNORMAL HIGH (ref 2.3–4.2)

## 2015-06-01 ENCOUNTER — Ambulatory Visit: Payer: 59 | Admitting: Internal Medicine

## 2015-06-01 ENCOUNTER — Encounter: Payer: Self-pay | Admitting: Internal Medicine

## 2015-06-01 ENCOUNTER — Ambulatory Visit (INDEPENDENT_AMBULATORY_CARE_PROVIDER_SITE_OTHER): Payer: 59 | Admitting: Internal Medicine

## 2015-06-01 VITALS — BP 118/60 | HR 99 | Temp 98.4°F | Resp 12 | Wt 169.4 lb

## 2015-06-01 DIAGNOSIS — E0500 Thyrotoxicosis with diffuse goiter without thyrotoxic crisis or storm: Secondary | ICD-10-CM

## 2015-06-01 MED ORDER — ATENOLOL 25 MG PO TABS: 25.0000 mg | ORAL_TABLET | Freq: Two times a day (BID) | ORAL | Status: DC

## 2015-06-01 NOTE — Patient Instructions (Signed)
Please increase Methimazole to 20 mg 2x a day. Come back for labs in 4 weeks. Please come back for a follow-up appointment in 3 months.   Radioiodine (I-131) Therapy for Hyperthyroidism Radioiodine (I-131) therapy for hyperthyroidism is a procedure used to treat an overactive thyroid (hyperthyroidism). The patient swallows I-131, which is a radioactive form of iodine. The I-131 destroys thyroid cells. The thyroid is a gland in the neck. It makes thyroid hormones, which control how cells throughout the body use energy. Hyperthyroidism is usually caused by Grave's disease or by growths within the thyroid (nodules). Symptoms may include:  Nervousness.  Irritability.  Problems with sleep.  Tiredness.  Fast heart rate.  Shaky hands.  Sweating.  Heat sensitivity.  Unintended weight loss.  Brittle hair.  Enlarged thyroid gland.  Menstrual changes.  Frequent stools. LET YOUR CAREGIVER KNOW ABOUT:   All allergies.  All medications that you are taking, including over-the-counter and prescription drugs, dietary supplements, vitamins, or herbal preparations.  Any previous complications from this or other procedures.  Smoking history.  Possibility of pregnancy.  History of bleeding problems.  Any other health problems. RISKS AND COMPLICATIONS  Risks of the procedure include:  Slight pain in the area of the thyroid gland. BEFORE THE PROCEDURE  If you are a woman, you may be asked to have pregnancy testing before the procedure.  If you have been taking thyroid medicines, you will usually be asked to stop them three days before your procedure.  You will usually be asked to stop eating and drinking at midnight the day of your procedure.  You will need to plan for someone to drive you home after the procedure. PROCEDURE  You will be asked to swallow the radioactive iodine in either pill or liquid form. It can take 1-3 months for the treatment to work. The treatment is  most effective at about 3-6 months after the dose of iodine has been given. In most people, a single dose of radioactive iodine resolves their hyperthyroidism, but a few people will require a second dose.  AFTER THE PROCEDURE  Because you will be giving off tiny amounts of radiation for several days, there are some special precautions you will be asked to follow for about 2-4 days after the procedure:  Avoid being around babies or pregnant women.  Do not use public bathrooms.  Flush twice after using the toilet.  Take a bath or shower every day.  Practice good hand washing.  Drink fluids normally.  Use disposable utensils, or clean your utensils separately from those of others.  Sleep alone.  Do not engage in intimate contact.  Wash your sheets, towels, and clothes each day, by themselves.  Do not make food for other people. Other precautions include:  Stopping breastfeeding.  Do not attempt to become pregnant for at least a year after you have had the procedure.  When traveling, bring a letter of explanation from your caregiver for three months. Radioactivity may trip detectors in airports or other places. Because the point of the procedure is to destroy your thyroid gland, you will need to take thyroid hormone by mouth for the rest of your life. HOME CARE INSTRUCTIONS   Ask your caregiver when you should resume or start thyroid medications.  Take all medications exactly as directed.  Follow any prescribed diet.  Follow instructions regarding both rest and physical activity. SEEK IMMEDIATE MEDICAL CARE IF:  You have a dry mouth.  You have a sore throat.  You have  neck pain.  You have a tight sensation in the throat.  You have nausea and vomiting.  You are fatigued.  You have flushing.  You have bowel changes (either diarrhea or constipation).   This information is not intended to replace advice given to you by your health care provider. Make sure you  discuss any questions you have with your health care provider.   Document Released: 12/22/2008 Document Revised: 10/28/2011 Document Reviewed: 11/30/2014 Elsevier Interactive Patient Education [employer]

## 2015-06-01 NOTE — Progress Notes (Signed)
Patient ID: Debra [name], female   DOB: 1996-01-12, 19 y.o.   MRN: [id number]    HPI  Debra [name] is a 19 y.o.-year-old female, returning for follow up for Graves ds. Last visit 3 months ago.  I reviewed and addended history: She started to have mood swings and anxiety episodes in 2014. More recently having heat intolerance. She has been having tremors for the last 3 years. She started to have heart racing, palpitations, fatigue, feeling hot in the last few months.  She started MMI 10 mg in am in 12/2014,  And we decreased the dose to 5 mg twice a day, as her TFTs were improving  In 02/2015. However, in 04/2015, her TFTs were still abnormal >> increased MMI to 10 mg 2x a day.  We also started atenolol 25 mg twice a day in 02/2015.  She had anxiety attacks in the past, and continues to have anxiety.  I reviewed pt's thyroid tests - last set from yesterday:  Lab Results  Component Value Date   TSH 0.20* 05/31/2015   TSH 0.14* 04/28/2015   TSH 0.19* 03/01/2015   TSH 0.005* 01/10/2015   TSH 0.00* 12/22/2014   FREET4 2.47* 05/31/2015   FREET4 2.34* 04/28/2015   FREET4 1.75* 03/01/2015   FREET4 2.35* 01/10/2015  01/10/2015: TSI 588 12/08/14: TSH 0.006, TT4 11.5, T3U 33%,FTI 3.8 TgAB<1 Remainder panel was normal for E2, Progesterone, PRL, testosterone  Thyroid US, [facility] (12/30/14) - normal, except heterogeneity: Right thyroid lobe Measurements: 4.0 x 1.3 x 1.3 cm. No nodules visualized. Mildly heterogeneous tissue.  Left thyroid lobe Measurements: 4.3 x 1.0 x 1.3 cm. No nodules visualized. Mildly heterogeneous tissue.  Isthmus Thickness: 3 mm. No nodules visualized.  Lymphadenopathy: None visualized.  IMPRESSION: No evidence of thyroid nodule. Mildly heterogeneous glandular tissue. Normal size.  Thyroid Uptake and scan (01/13/2015): The thyroid scan demonstrates fairly symmetric and homogeneous uptake in the thyroid gland. No hot or cold nodules  are demonstrated.  4 hour I 131 uptake = 51.2% (normal 5-20%)  24 hour I 131 uptake = 55.8% (normal 10-30%)  IMPRESSION: Elevated 4 and 24 hr iodine 123 uptake consistent with hyperthyroidism/Graves disease.  Pt denies feeling nodules in neck, hoarseness, dysphagia/odynophagia, SOB with lying down; she c/o: - + fatigue - + excessive sweating/heat intolerance - + tremors - + anxiety - + palpitations - no hyperdefecation - no weight loss - no hair loss - + irregular menses  Pt does have a FH of thyroid ds. - hyper- or hypo-thyroidism in GGM. No FH of thyroid cancer. No h/o radiation tx to head or neck.  No seaweed or kelp, no recent contrast studies. No steroid use. No herbal supplements. No Biotin use.  I reviewed her chart and she also has a history of irregular menses.  ROS: Constitutional: see HPI Eyes: no blurry vision, no xerophthalmia ENT: no sore throat, no nodules palpated in throat, no dysphagia/odynophagia, no hoarseness Cardiovascular: no CP/+ SOB when anxious/+ palpitations/+ leg swelling Respiratory: no cough/+ SOB/ +  wheezing Gastrointestinal: no N/V/D/C Musculoskeletal: no muscle/joint aches Skin: no rashes, +  itching Neurological: + tremors/no numbness/tingling/dizziness, +  headaches Psychiatric: no depression/+++anxiety + irreg menses  Past Medical History  Diagnosis Date  . Anxiety   . Depression   . Thyroid disease   . History of irregular menstrual cycles   . Abnormal TSH   . Irregular menses    Past Surgical History  Procedure Laterality Date  . Tonsillectomy and adenoidectomy  2012  History   Social History  . Marital Status: Single    Spouse Name: N/A  . Number of Children: 0   Occupational History  . student   Social History Main Topics  . Smoking status: Never Smoker   . Smokeless tobacco: Never Used  . Alcohol Use: Yes  . Drug Use: No   Current Outpatient Prescriptions on File Prior to Visit  Medication Sig Dispense  Refill  . atenolol (TENORMIN) 25 MG tablet Take 1 tablet (25 mg total) by mouth 2 (two) times daily. 60 tablet 2  . methimazole (TAPAZOLE) 10 MG tablet Take 1 tablet (10 mg total) by mouth 2 (two) times daily. 60 tablet 1  . Norethindrone-Ethinyl Estradiol-Fe Biphas (LO LOESTRIN FE) 1 MG-10 MCG / 10 MCG tablet Take 1 tablet by mouth daily. 1 Package 11   No current facility-administered medications on file prior to visit.   Allergies  Allergen Reactions  . Sulfa Antibiotics Rash   Family History  Problem Relation Age of Onset  . Thyroid disease Other     PE: BP 118/60 mmHg  Pulse 99  Temp(Src) 98.4 F (36.9 C) (Oral)  Resp 12  Wt 169 lb 6.4 oz (76.839 kg)  SpO2 97% Body mass index is 25 kg/(m^2). Wt Readings from Last 3 Encounters:  06/01/15 169 lb 6.4 oz (76.839 kg)  03/24/15 168 lb 4.8 oz (76.34 kg) (91 %*, Z = 1.34)  03/01/15 171 lb 9.6 oz (77.837 kg) (92 %*, Z = 1.42)   * Growth percentiles are based on CDC 2-20 Years data.   Constitutional: normal weight, in NAD Eyes: PERRLA, EOMI, no exophthalmos, no lid lag, no stare ENT: moist mucous membranes, no thyromegaly, no thyroid bruits, no cervical lymphadenopathy Cardiovascular: tachycardia, RR, No MRG, + mild periankle edema, nonpitting Respiratory: CTA B Gastrointestinal: abdomen soft, NT, ND, BS+ Musculoskeletal: no deformities, strength intact in all 4 Skin: moist, warm, no rashes Neurological: + tremor with outstretched hands, DTR normal in all 4  ASSESSMENT: 1. Graves ds  PLAN:  1. Patient with a recent dx of Graves ds., with thyrotoxic sxs: fatigue, heat intolerance, tremors, palpitations, anxiety.  She is on methimazole  10 mg twice a day for the last month, however, the thyroid tests obtained yesterday are worse. We discussed about different possibilities of treatment, to include methimazole use, radioactive iodine ablation or (last resort) surgery.  We discussed about  Each of them , with risks and benefits,  and she would like to avoid radioactive iodine treatment or surgery, if possible. Since she is on a pretty good dose of methimazole right now, I suggested that we increase further to 20 mg twice a day, but  If this is not lowering her thyroid tests significantly at next check, I strongly suggest radioactive iodine treatment. I gave patient information to read about this. - I suggested that we check the TSH, fT3 and fT4  In a month. -  I refilled her beta blocker (Atenolol 25 mg bid).  She is tachycardic today, but ran out of atenolol and did not take it today. - RTC in 3 months, but likely sooner for repeat labs

## 2015-07-05 ENCOUNTER — Encounter: Payer: Self-pay | Admitting: Internal Medicine

## 2015-07-08 ENCOUNTER — Other Ambulatory Visit: Payer: Self-pay | Admitting: Endocrinology

## 2015-07-18 ENCOUNTER — Other Ambulatory Visit (INDEPENDENT_AMBULATORY_CARE_PROVIDER_SITE_OTHER): Payer: 59

## 2015-07-18 DIAGNOSIS — E0500 Thyrotoxicosis with diffuse goiter without thyrotoxic crisis or storm: Secondary | ICD-10-CM

## 2015-07-18 LAB — T3, FREE: T3 FREE: 8.7 pg/mL — AB (ref 2.3–4.2)

## 2015-07-18 LAB — TSH: TSH: 0.08 u[IU]/mL — ABNORMAL LOW (ref 0.35–5.50)

## 2015-07-18 LAB — T4, FREE: FREE T4: 2.8 ng/dL — AB (ref 0.60–1.60)

## 2015-07-19 ENCOUNTER — Other Ambulatory Visit: Payer: Self-pay | Admitting: Internal Medicine

## 2015-07-19 DIAGNOSIS — E0500 Thyrotoxicosis with diffuse goiter without thyrotoxic crisis or storm: Secondary | ICD-10-CM

## 2015-07-20 ENCOUNTER — Other Ambulatory Visit: Payer: Self-pay | Admitting: *Deleted

## 2015-07-20 ENCOUNTER — Encounter: Payer: Self-pay | Admitting: Internal Medicine

## 2015-07-20 MED ORDER — METHIMAZOLE 10 MG PO TABS: ORAL_TABLET | ORAL | Status: DC

## 2015-07-20 NOTE — Telephone Encounter (Signed)
Pt requested refill via MyChart. Dosage per Dr Elvera LennoxGherghe from MyChart message to pt.

## 2015-08-22 ENCOUNTER — Telehealth: Payer: Self-pay | Admitting: Internal Medicine

## 2015-08-22 NOTE — Telephone Encounter (Signed)
Dr Elvera LennoxGherghe have called Debra [name] several times to try and schedule. Spoke with Debra [name] on 08/03/15 and Debra [name] said that her mom would call because she hasn't  decided if she wanted her to have it done. Please advise.

## 2015-08-23 NOTE — Telephone Encounter (Signed)
OK, will give them time to think, then. Thank you!

## 2015-08-28 ENCOUNTER — Telehealth: Payer: Self-pay | Admitting: Internal Medicine

## 2015-08-28 NOTE — Telephone Encounter (Signed)
Pt and mom calling to see if the appt on Friday is necessary if she has not had the RAI and still is not sure she wants to

## 2015-08-29 NOTE — Telephone Encounter (Signed)
Called pt and lvm advising her per Dr Gherghe's message below.  

## 2015-08-29 NOTE — Telephone Encounter (Signed)
Yes, I would like to see her and check her TFTs. She is on a high dose of MMI >> will need to check on her.

## 2015-08-29 NOTE — Telephone Encounter (Signed)
Please read message below and advise.  

## 2015-09-01 ENCOUNTER — Encounter: Payer: Self-pay | Admitting: Internal Medicine

## 2015-09-01 ENCOUNTER — Ambulatory Visit (INDEPENDENT_AMBULATORY_CARE_PROVIDER_SITE_OTHER): Payer: 59 | Admitting: Internal Medicine

## 2015-09-01 VITALS — BP 106/58 | HR 78 | Temp 98.2°F | Resp 12 | Wt 165.2 lb

## 2015-09-01 DIAGNOSIS — E0500 Thyrotoxicosis with diffuse goiter without thyrotoxic crisis or storm: Secondary | ICD-10-CM | POA: Diagnosis not present

## 2015-09-01 LAB — T3, FREE: T3, Free: 6.6 pg/mL — ABNORMAL HIGH (ref 2.3–4.2)

## 2015-09-01 LAB — T4, FREE: Free T4: 1.6 ng/dL (ref 0.60–1.60)

## 2015-09-01 LAB — TSH: TSH: 0.25 u[IU]/mL — AB (ref 0.35–5.50)

## 2015-09-01 NOTE — Progress Notes (Signed)
Patient ID: Debra [name], female   DOB: May 05, 1996, 20 y.o.   MRN: [id number]    HPI  Debra [name] is a 20 y.o.-year-old female, returning for follow up for Graves ds. Last visit 3 months ago.  I reviewed and addended history: She started to have mood swings and anxiety episodes in 2014.She then developed heat intolerance. She has been having tremors for the last 3 years. She also started to have heart racing, palpitations, fatigue, feeling hot.  She started MMI 10 mg in am in 12/2014>> we decreased the dose to 5 mg 2x a day, as her TFTs were improving in 02/2015, however, in 04/2015, her TFTs were still abnormal >> increased MMI to 10 mg 2x a day and then to 20-10-20 mg >> feels better on this dose.   We also started atenolol 25 mg 2x a day in 02/2015 >> she continues this today.  She had anxiety attacks in the past, and continues to have anxiety, but much better.   I reviewed pt's thyroid tests: Lab Results  Component Value Date   TSH 0.08* 07/18/2015   TSH 0.20* 05/31/2015   TSH 0.14* 04/28/2015   TSH 0.19* 03/01/2015   TSH 0.005* 01/10/2015   TSH 0.00* 12/22/2014   FREET4 2.80* 07/18/2015   FREET4 2.47* 05/31/2015   FREET4 2.34* 04/28/2015   FREET4 1.75* 03/01/2015   FREET4 2.35* 01/10/2015  01/10/2015: TSI 588 12/08/14: TSH 0.006, TT4 11.5, T3U 33%,FTI 3.8 TgAB<1 Remainder panel was normal for E2, Progesterone, PRL, testosterone  Thyroid US, [facility] (12/30/14) - normal, except heterogeneity: Right thyroid lobe Measurements: 4.0 x 1.3 x 1.3 cm. No nodules visualized. Mildly heterogeneous tissue.  Left thyroid lobe Measurements: 4.3 x 1.0 x 1.3 cm. No nodules visualized. Mildly heterogeneous tissue.  Isthmus Thickness: 3 mm. No nodules visualized.  Lymphadenopathy: None visualized.  IMPRESSION: No evidence of thyroid nodule. Mildly heterogeneous glandular tissue. Normal size.  Thyroid Uptake and scan (01/13/2015): The thyroid scan demonstrates fairly  symmetric and homogeneous uptake in the thyroid gland. No hot or cold nodules are demonstrated.  4 hour I 131 uptake = 51.2% (normal 5-20%)  24 hour I 131 uptake = 55.8% (normal 10-30%)  IMPRESSION: Elevated 4 and 24 hr iodine 123 uptake consistent with hyperthyroidism/Graves disease.  Pt denies feeling nodules in neck, hoarseness, dysphagia/odynophagia, SOB with lying down; she c/o: - improved fatigue - + excessive sweating/heat intolerance - + tremors - improved anxiety - + palpitations - + SOB - no hyperdefecation - no weight loss - no hair loss - + more regular menses  Pt does have a FH of thyroid ds. - hyper- or hypo-thyroidism in GGM. No FH of thyroid cancer. No h/o radiation tx to head or neck.  No seaweed or kelp, no recent contrast studies. No steroid use. No herbal supplements. No Biotin use.  ROS: Constitutional: see HPI Eyes: no blurry vision, no xerophthalmia ENT: no sore throat, no nodules palpated in throat, no dysphagia/odynophagia, no hoarseness Cardiovascular: no CP/+ SOB when anxious/+ palpitations/no leg swelling Respiratory: no cough/+ SOB/ no  wheezing Gastrointestinal: no N/V/D/C Musculoskeletal: no muscle/joint aches Skin: no rashes, Neurological: + tremors/no numbness/tingling/dizziness  I reviewed pt's medications, allergies, PMH, social hx, family hx, and changes were documented in the history of present illness. Otherwise, unchanged from my initial visit note.  Past Medical History  Diagnosis Date  . Anxiety   . Depression   . Thyroid disease   . History of irregular menstrual cycles   . Abnormal  TSH   . Irregular menses    Past Surgical History  Procedure Laterality Date  . Tonsillectomy and adenoidectomy  2012   History   Social History  . Marital Status: Single    Spouse Name: N/A  . Number of Children: 0   Occupational History  . student   Social History Main Topics  . Smoking status: Never Smoker   . Smokeless  tobacco: Never Used  . Alcohol Use: Yes  . Drug Use: No   Current Outpatient Prescriptions on File Prior to Visit  Medication Sig Dispense Refill  . atenolol (TENORMIN) 25 MG tablet Take 1 tablet (25 mg total) by mouth 2 (two) times daily. 60 tablet 2  . methimazole (TAPAZOLE) 10 MG tablet Take 20 mg in the am, 10 mg with lunch and 20 mg with dinner. 150 tablet 0  . Norethindrone-Ethinyl Estradiol-Fe Biphas (LO LOESTRIN FE) 1 MG-10 MCG / 10 MCG tablet Take 1 tablet by mouth daily. (Patient not taking: Reported on 09/01/2015) 1 Package 11   No current facility-administered medications on file prior to visit.   Allergies  Allergen Reactions  . Sulfa Antibiotics Rash   Family History  Problem Relation Age of Onset  . Thyroid disease Other     PE: BP 106/58 mmHg  Pulse 78  Temp(Src) 98.2 F (36.8 C) (Oral)  Resp 12  Wt 165 lb 3.2 oz (74.934 kg)  SpO2 98% Body mass index is 24.38 kg/(m^2). Wt Readings from Last 3 Encounters:  09/01/15 165 lb 3.2 oz (74.934 kg)  06/01/15 169 lb 6.4 oz (76.839 kg)  03/24/15 168 lb 4.8 oz (76.34 kg) (91 %*, Z = 1.34)   * Growth percentiles are based on CDC 2-20 Years data.   Constitutional: normal weight, in NAD Eyes: PERRLA, EOMI, no exophthalmos, no lid lag, no stare ENT: moist mucous membranes, no thyromegaly, no cervical lymphadenopathy Cardiovascular: RRR, No MRG, no leg swelling Respiratory: CTA B Gastrointestinal: abdomen soft, NT, ND, BS+ Musculoskeletal: no deformities, strength intact in all 4 Skin: moist, warm, no rashes Neurological: + tremor with outstretched hands, DTR normal in all 4  ASSESSMENT: 1. Graves ds  PLAN:  1. Patient with Graves ds., with thyrotoxic sxs: fatigue, heat intolerance, tremors, palpitations, anxiety, not responding very well on methimazole, but now feeling better on a high dose of the medication (20-10-20 mg with each meal). At last visit, she was on  10 mg 2x a day for the previous month, however, the  thyroid tests were worse. We increased the dose then and discussed about different possibilities of treatment, to include methimazole use, radioactive iodine ablation or (last resort) surgery. I suggested radioactive iodine treatment, which she refused. We again discussed about all the options today with her mother in her sister, who accompanied her to the appointment today. I explained about the advantages and possible side effects of radioactive iodine treatment. Since she is feeling better on the higher dose of methimazole, I agreed to check the thyroid tests first and then decide whether we can do without RAI treatment. She did miss several doses in the last week.  - will check the TSH, fT3 and fT4 today -  Continue beta blocker (Atenolol 25 mg bid).  She is not tachycardic today - RTC in 3 months, but likely sooner for repeat labs  Component     Latest Ref Rng 09/01/2015  TSH     0.35 - 5.50 uIU/mL 0.25 (L)  T3, Free     2.3 -  4.2 pg/mL 6.6 (H)  Free T4     0.60 - 1.60 ng/dL 9.60  TFTs MUCH better! Will continue current MMI dose and recheck TFTs in 4 weeks.

## 2015-09-01 NOTE — Patient Instructions (Signed)
Please stop at the lab.  Please come back for a follow-up appointment in 3 months  

## 2015-09-11 ENCOUNTER — Other Ambulatory Visit: Payer: Self-pay | Admitting: Internal Medicine

## 2015-10-25 ENCOUNTER — Other Ambulatory Visit (INDEPENDENT_AMBULATORY_CARE_PROVIDER_SITE_OTHER): Payer: 59

## 2015-10-25 DIAGNOSIS — E0500 Thyrotoxicosis with diffuse goiter without thyrotoxic crisis or storm: Secondary | ICD-10-CM | POA: Diagnosis not present

## 2015-10-25 LAB — TSH: TSH: 0.12 u[IU]/mL — ABNORMAL LOW (ref 0.35–5.50)

## 2015-10-25 LAB — T4, FREE: Free T4: 1.39 ng/dL (ref 0.60–1.60)

## 2015-10-25 LAB — T3, FREE: T3 FREE: 4.8 pg/mL — AB (ref 2.3–4.2)

## 2015-10-26 ENCOUNTER — Telehealth: Payer: Self-pay | Admitting: *Deleted

## 2015-10-26 NOTE — Telephone Encounter (Signed)
Pt called for lab results. Please advise at your convenience.

## 2015-11-02 ENCOUNTER — Telehealth: Payer: Self-pay | Admitting: Obstetrics and Gynecology

## 2015-11-02 NOTE — Telephone Encounter (Signed)
PT CALLED AND SHE HASN'T BEEN SEEN IN A WHILE AND WANTED A CALL BACK FROM YOU SHE DIDN'T LEAVE THE QUESTIONS WITH ME, BUT SHE IS ON SPRING BREAK THIS WEEK AND WANTED A CALL BACK TODAY OR TOMORROW SHE SAID.

## 2015-11-03 NOTE — Telephone Encounter (Signed)
Spoke with pt she desires appt to discuss weight loss medication

## 2015-11-20 ENCOUNTER — Other Ambulatory Visit: Payer: Self-pay

## 2015-11-20 ENCOUNTER — Telehealth: Payer: Self-pay | Admitting: Internal Medicine

## 2015-11-20 MED ORDER — METHIMAZOLE 10 MG PO TABS: ORAL_TABLET | ORAL | Status: DC

## 2015-11-20 NOTE — Telephone Encounter (Signed)
Pt advised of instructions and voiced understanding.  

## 2015-11-20 NOTE — Telephone Encounter (Signed)
i reviewed chart: TFT are not abnormal enough to explain these sxs. You should have this checked at PCP or urgent care.

## 2015-11-20 NOTE — Telephone Encounter (Signed)
Pt called again to speak to someone, is very concerned with symptoms she is having.

## 2015-11-20 NOTE — Telephone Encounter (Signed)
I contacted the pt. Pt stated yesterday morning she began having chest pains and tightness. Pt stated the pain started on the right and then went to the left and now the pain is equal on both sides. Pt also stated her resting heart beat was really fast (did not have an exact number). Pt wanted to know if this could be related to her thyroid and if so what should be done to help. Pt stated she was uncomfortable last night and the pain was still present today. Pt was advise Dr. Elvera LennoxGherghe is currently out of the office. Please advise during her absence, Thanks!

## 2015-11-20 NOTE — Telephone Encounter (Signed)
Pt has questions about her condition and about meds

## 2015-12-05 ENCOUNTER — Ambulatory Visit (INDEPENDENT_AMBULATORY_CARE_PROVIDER_SITE_OTHER): Payer: 59 | Admitting: Obstetrics and Gynecology

## 2015-12-05 ENCOUNTER — Encounter: Payer: Self-pay | Admitting: Obstetrics and Gynecology

## 2015-12-05 VITALS — BP 117/82 | HR 84 | Ht 68.5 in | Wt 178.3 lb

## 2015-12-05 DIAGNOSIS — E663 Overweight: Secondary | ICD-10-CM

## 2015-12-05 MED ORDER — CYANOCOBALAMIN 1000 MCG/ML IJ SOLN: 1000.0000 ug | Freq: Once | INTRAMUSCULAR | Status: DC

## 2015-12-05 MED ORDER — PHENTERMINE HCL 37.5 MG PO TABS: 37.5000 mg | ORAL_TABLET | Freq: Every day | ORAL | Status: DC

## 2015-12-05 NOTE — Progress Notes (Signed)
Subjective:  Debra [name] is a 20 y.o. No obstetric history on file. at Unknown being seen today for weight loss management- initial visit.  Patient reports General ROS: negative and reports previous weight loss attempts:  Onset was gradual over last few months ago.  Onset followed:  starting medication for Graves Disease.     The following portions of the patient's history were reviewed and updated as appropriate: allergies, current medications, past family history, past medical history, past social history, past surgical history and problem list.   Objective:   Filed Vitals:   12/05/15 1408  BP: 117/82  Pulse: 84  Height: 5' 8.5" (1.74 m)  Weight: 178 lb 4.8 oz (80.876 kg)    General:  Alert, oriented and cooperative. Patient is in no acute distress.  :   :   :   :   :   :   PE: Well groomed female in no current distress,   Mental Status: Normal mood and affect. Normal behavior. Normal judgment and thought content.   Current BMI: Body mass index is 26.71 kg/(m^2).   Assessment and Plan:  Obesity  1. Overweight (BMI 25.0-29.9)   Plan: low carb, High protein diet RX for adipex 37.5 mg daily and B12 1000mcg.ml monthly, to start now with first injection given at today's visit. Reviewed side-effects common to both medications and expected outcomes. Increase daily water intake to at least 8 bottle a day, every day.  Goal is to reduse weight by 10% by end of three months, and will re-evaluate then.  RTC in 4 weeks for Nurse visit to check weight & BP, and get next B12 injections.    Please refer to After Visit Summary for other counseling recommendations.    Debra [name], CNM   Debra [name], CNM      Consider the Low Glycemic Index Diet and 6 smaller meals daily .  This boosts your metabolism and regulates your sugars:   Use the protein bar by [employer]kins because they have lots of fiber in them  Find the low carb flatbreads, tortillas and pita  breads for sandwiches:  Joseph's makes a pita bread and a flat bread , available at [facility] [employer] and [employer]; Toufayah makes a low carb flatbread available at [employer] and HT that is 9 net carbs and 100 cal Mission makes a low carb whole wheat tortilla available at [employer],and most grocery stores with 6 net carbs and 210 cal  [place] yogurt can still have a lot of carbs .  Dannon Light N fit has 80 cal and 8 carbs

## 2015-12-07 ENCOUNTER — Other Ambulatory Visit: Payer: Self-pay | Admitting: *Deleted

## 2015-12-07 MED ORDER — ATENOLOL 25 MG PO TABS: 25.0000 mg | ORAL_TABLET | Freq: Two times a day (BID) | ORAL | Status: DC

## 2015-12-08 ENCOUNTER — Ambulatory Visit: Payer: 59 | Admitting: Internal Medicine

## 2015-12-15 ENCOUNTER — Encounter: Payer: Self-pay | Admitting: Obstetrics and Gynecology

## 2015-12-19 ENCOUNTER — Ambulatory Visit (INDEPENDENT_AMBULATORY_CARE_PROVIDER_SITE_OTHER): Payer: 59 | Admitting: Internal Medicine

## 2015-12-19 ENCOUNTER — Other Ambulatory Visit (INDEPENDENT_AMBULATORY_CARE_PROVIDER_SITE_OTHER): Payer: 59

## 2015-12-19 ENCOUNTER — Encounter: Payer: Self-pay | Admitting: Internal Medicine

## 2015-12-19 VITALS — BP 104/68 | HR 71 | Temp 97.9°F | Resp 12 | Wt 172.2 lb

## 2015-12-19 DIAGNOSIS — E0500 Thyrotoxicosis with diffuse goiter without thyrotoxic crisis or storm: Secondary | ICD-10-CM

## 2015-12-19 LAB — TSH: TSH: 0.05 u[IU]/mL — ABNORMAL LOW (ref 0.35–5.50)

## 2015-12-19 LAB — T4, FREE: Free T4: 2.03 ng/dL — ABNORMAL HIGH (ref 0.60–1.60)

## 2015-12-19 LAB — T3, FREE: T3 FREE: 6.4 pg/mL — AB (ref 2.3–4.2)

## 2015-12-19 MED ORDER — METHIMAZOLE 10 MG PO TABS: ORAL_TABLET | ORAL | Status: DC

## 2015-12-19 NOTE — Progress Notes (Signed)
Patient ID: Debra [name], female   DOB: 07-17-1996, 20 y.o.   MRN: [id number]    HPI  Debra [name] is a 20 y.o.-year-old female, returning for follow up for Graves ds. Last visit 3 months ago.  I reviewed and addended history: She started to have mood swings and anxiety episodes in 2014.She then developed heat intolerance. She has been having tremors for the last 3 years. She also started to have heart racing, palpitations, fatigue, feeling hot.  She started MMI 10 mg in am in 12/2014 >> we decreased the dose to 5 mg 2x a day, as her TFTs were improving in 02/2015, however, in 04/2015, her TFTs were still abnormal >> increased MMI to 10 mg 2x a day and then to 20-10-20 mg >> continues this dose today.  We also started atenolol 25 mg 2x a day in 02/2015 >> she continues on this today.  She had anxiety attacks in the past, and continues to have anxiety, but much better.   I reviewed pt's thyroid tests: Lab Results  Component Value Date   TSH 0.12* 10/25/2015   TSH 0.25* 09/01/2015   TSH 0.08* 07/18/2015   TSH 0.20* 05/31/2015   TSH 0.14* 04/28/2015   TSH 0.19* 03/01/2015   TSH 0.005* 01/10/2015   TSH 0.00* 12/22/2014   FREET4 1.39 10/25/2015   FREET4 1.60 09/01/2015   FREET4 2.80* 07/18/2015   FREET4 2.47* 05/31/2015   FREET4 2.34* 04/28/2015   FREET4 1.75* 03/01/2015   FREET4 2.35* 01/10/2015  01/10/2015: TSI 588 12/08/14: TSH 0.006, TT4 11.5, T3U 33%,FTI 3.8 TgAB<1 Remainder panel was normal for E2, Progesterone, PRL, testosterone  Thyroid US, [facility] (12/30/14) - normal, except heterogeneity: Right thyroid lobe Measurements: 4.0 x 1.3 x 1.3 cm. No nodules visualized. Mildly heterogeneous tissue.  Left thyroid lobe Measurements: 4.3 x 1.0 x 1.3 cm. No nodules visualized. Mildly heterogeneous tissue.  Isthmus Thickness: 3 mm. No nodules visualized.  Lymphadenopathy: None visualized.  IMPRESSION: No evidence of thyroid nodule. Mildly heterogeneous glandular  tissue. Normal size.  Thyroid Uptake and scan (01/13/2015): The thyroid scan demonstrates fairly symmetric and homogeneous uptake in the thyroid gland. No hot or cold nodules are demonstrated.  4 hour I 131 uptake = 51.2% (normal 5-20%)  24 hour I 131 uptake = 55.8% (normal 10-30%)  IMPRESSION: Elevated 4 and 24 hr iodine 123 uptake consistent with hyperthyroidism/Graves disease.  Pt denies feeling nodules in neck, hoarseness, dysphagia/odynophagia, SOB with lying down; she feels much better, but still c/o: - + excessive sweating/heat intolerance - + tremors - improved fatigue - improved anxiety - nopalpitations - no SOB - no hyperdefecation - no weight loss - no hair loss - + regular menses  She started more intensive exercise.   Pt does have a FH of thyroid ds. - hyper- or hypo-thyroidism in GGM. No FH of thyroid cancer. No h/o radiation tx to head or neck.  No seaweed or kelp, no recent contrast studies. No steroid use. No herbal supplements. No Biotin use.  ROS: Constitutional: see HPI Eyes: no blurry vision, no xerophthalmia ENT: no sore throat, no nodules palpated in throat, no dysphagia/odynophagia, no hoarseness Cardiovascular: no CP/SOB/palpitations/no leg swelling Respiratory: no cough/SOB/ no  wheezing Gastrointestinal: no N/V/D/C Musculoskeletal: no muscle/joint aches Skin: no rashes, Neurological: + tremors/no numbness/tingling/dizziness  I reviewed pt's medications, allergies, PMH, social hx, family hx, and changes were documented in the history of present illness. Otherwise, unchanged from my initial visit note. Started Phentermine. Now also on B12  monthly. Stopped LoLoEstrin.  Past Medical History  Diagnosis Date  . Anxiety   . Depression   . Thyroid disease   . History of irregular menstrual cycles   . Abnormal TSH   . Irregular menses    Past Surgical History  Procedure Laterality Date  . Tonsillectomy and adenoidectomy  2012   History    Social History  . Marital Status: Single    Spouse Name: N/A  . Number of Children: 0   Occupational History  . student   Social History Main Topics  . Smoking status: Never Smoker   . Smokeless tobacco: Never Used  . Alcohol Use: Yes  . Drug Use: No   Current Outpatient Prescriptions on File Prior to Visit  Medication Sig Dispense Refill  . atenolol (TENORMIN) 25 MG tablet Take 1 tablet (25 mg total) by mouth 2 (two) times daily. 180 tablet 0  . cyanocobalamin (,VITAMIN B-12,) 1000 MCG/ML injection Inject 1 mL (1,000 mcg total) into the muscle once. 10 mL 1  . methimazole (TAPAZOLE) 10 MG tablet TAKE 20 MG IN THE AM, 10 MG WITH LUNCH AND 20 MG WITH DINNER. 150 tablet 1  . Norethindrone-Ethinyl Estradiol-Fe Biphas (LO LOESTRIN FE) 1 MG-10 MCG / 10 MCG tablet Take 1 tablet by mouth daily. (Patient not taking: Reported on 09/01/2015) 1 Package 11  . phentermine (ADIPEX-P) 37.5 MG tablet Take 1 tablet (37.5 mg total) by mouth daily before breakfast. 30 tablet 2   No current facility-administered medications on file prior to visit.   Allergies  Allergen Reactions  . Sulfa Antibiotics Rash   Family History  Problem Relation Age of Onset  . Thyroid disease Other     PE: BP 104/68 mmHg  Pulse 71  Temp(Src) 97.9 F (36.6 C) (Oral)  Resp 12  Wt 172 lb 3.2 oz (78.109 kg)  SpO2 96%  LMP 11/18/2015 Body mass index is 25.8 kg/(m^2). Wt Readings from Last 3 Encounters:  12/19/15 172 lb 3.2 oz (78.109 kg)  12/05/15 178 lb 4.8 oz (80.876 kg)  09/01/15 165 lb 3.2 oz (74.934 kg)   Constitutional: normal weight, in NAD Eyes: PERRLA, EOMI, no exophthalmos, no lid lag, no stare ENT: moist mucous membranes, no thyromegaly, no cervical lymphadenopathy Cardiovascular: RRR, No MRG, no leg swelling Respiratory: CTA B Gastrointestinal: abdomen soft, NT, ND, BS+ Musculoskeletal: no deformities, strength intact in all 4 Skin: moist, warm, no rashes Neurological: + tremor with  outstretched hands, DTR normal in all 4  ASSESSMENT: 1. Graves ds  PLAN:  1. Patient with Graves ds., with thyrotoxic sxs: fatigue, heat intolerance, tremors, palpitations, anxiety, not responding very well on methimazole, but improving on a high dose of the medication (20-10-20 mg with each meal).   - We did discusse about different possibilities of treatment, to include methimazole use, radioactive iodine ablation or (last resort) surgery. I suggested radioactive iodine treatment, which she refused. We again discussed about all the options today with her mother in her sister, who accompanied her to the appointment today. I explained about the advantages and possible side effects of radioactive iodine treatment. Since she is feeling better on the higher dose of methimazole, I agreed to check the thyroid tests first and then decide whether we can do without RAI treatment.  - will check the TSH, fT3 and fT4 today -  Continue beta blocker (Atenolol 25 mg bid).  She is not tachycardic today. She started phentermine since last visit, but did not notice increased palpitations with  it. - RTC in 6 months, but likely sooner for repeat labs  Component     Latest Ref Rng 12/19/2015  TSH     0.35 - 5.50 uIU/mL 0.05 (L)  Triiodothyronine,Free,Serum     2.3 - 4.2 pg/mL 6.4 (H)  T4,Free(Direct)     0.60 - 1.60 ng/dL 7.822.03 (H)  TFTs are worse... We'll increase methimazole to 20 mg 3 times a day and recheck in 5 weeks.

## 2015-12-19 NOTE — Patient Instructions (Signed)
Please continue Methimazole 20 mg in am, 10 mg at lunch, 20 mg with dinner.  [facility].  Please come back for a follow-up appointment in 6 months.

## 2016-01-02 ENCOUNTER — Ambulatory Visit (INDEPENDENT_AMBULATORY_CARE_PROVIDER_SITE_OTHER): Payer: 59 | Admitting: Obstetrics and Gynecology

## 2016-01-02 ENCOUNTER — Ambulatory Visit: Payer: 59

## 2016-01-02 VITALS — BP 107/66 | HR 74 | Wt 165.4 lb

## 2016-01-02 DIAGNOSIS — E663 Overweight: Secondary | ICD-10-CM

## 2016-01-02 MED ORDER — CYANOCOBALAMIN 1000 MCG/ML IJ SOLN
1000.0000 ug | Freq: Once | INTRAMUSCULAR | Status: AC
  Administered 2016-01-02: 1000 ug via INTRAMUSCULAR

## 2016-01-02 NOTE — Progress Notes (Signed)
Patient ID: Margaree MackintoshLindsay R Gilliam, female   DOB: 1996-07-25, 20 y.o.   MRN: [id number] Pt presents for weight, B/P, B-12 injection. No side effects of medication-Phentermine, or B-12.  Weight loss of  13  lbs. Encouraged eating healthy and exercise.

## 2016-01-03 ENCOUNTER — Ambulatory Visit: Payer: 59

## 2016-01-30 ENCOUNTER — Ambulatory Visit (INDEPENDENT_AMBULATORY_CARE_PROVIDER_SITE_OTHER): Payer: 59 | Admitting: Obstetrics and Gynecology

## 2016-01-30 VITALS — BP 96/71 | HR 73 | Wt 162.0 lb

## 2016-01-30 DIAGNOSIS — E663 Overweight: Secondary | ICD-10-CM

## 2016-01-30 MED ORDER — CYANOCOBALAMIN 1000 MCG/ML IJ SOLN
1000.0000 ug | Freq: Once | INTRAMUSCULAR | Status: AC
  Administered 2016-01-30: 1000 ug via INTRAMUSCULAR

## 2016-01-30 NOTE — Progress Notes (Signed)
Patient ID: Debra [name], female   DOB: Mar 18, 1996, 20 y.o.   MRN: [id number] 01/30/2016  Pt presents for weight, B/P, B-12 injection. No side effects of medication-Phentermine, or B-12.  Weight loss of  3 lbs. Encouraged eating healthy and exercise.

## 2016-03-06 ENCOUNTER — Ambulatory Visit (INDEPENDENT_AMBULATORY_CARE_PROVIDER_SITE_OTHER): Payer: 59 | Admitting: Obstetrics and Gynecology

## 2016-03-06 ENCOUNTER — Encounter: Payer: Self-pay | Admitting: Obstetrics and Gynecology

## 2016-03-06 VITALS — BP 98/48 | HR 62 | Wt 166.3 lb

## 2016-03-06 DIAGNOSIS — Z79899 Other long term (current) drug therapy: Secondary | ICD-10-CM

## 2016-03-06 MED ORDER — PHENTERMINE HCL 37.5 MG PO TABS: 37.5000 mg | ORAL_TABLET | Freq: Every day | ORAL | Status: DC

## 2016-03-06 MED ORDER — CYANOCOBALAMIN 1000 MCG/ML IJ SOLN: 1000.0000 ug | Freq: Once | INTRAMUSCULAR | Status: DC

## 2016-03-06 NOTE — Progress Notes (Signed)
SUBJECTIVE:  20 y.o. here for follow-up weight loss visit, previously seen 4 weeks ago. Denies any concerns and feels like medication has worked well until last month. Is exercising in spin class daily, but only taking in around 800-900 calories a day. Desires to continue meds to lose about 10 more #s.  OBJECTIVE:  BP 98/48 mmHg  Pulse 62  Wt 166 lb 4.8 oz (75.433 kg)  Body mass index is 24.92 kg/(m^2). Patient appears well. ASSESSMENT:  Weight gain- responding well to weight loss plan PLAN:  To continue with current medications. B12 103200mcg/ml injection given. To increase protein intake before exercise and increase frequent small snacks. RTC in 4 weeks as planned  Melody EarltonShambley, CNM

## 2016-04-01 ENCOUNTER — Other Ambulatory Visit: Payer: Self-pay | Admitting: Internal Medicine

## 2016-04-16 ENCOUNTER — Ambulatory Visit (INDEPENDENT_AMBULATORY_CARE_PROVIDER_SITE_OTHER): Payer: 59 | Admitting: Obstetrics and Gynecology

## 2016-04-16 VITALS — BP 128/77 | HR 93 | Wt 169.2 lb

## 2016-04-16 DIAGNOSIS — E663 Overweight: Secondary | ICD-10-CM | POA: Diagnosis not present

## 2016-04-16 MED ORDER — CYANOCOBALAMIN 1000 MCG/ML IJ SOLN
1000.0000 ug | Freq: Once | INTRAMUSCULAR | Status: AC
  Administered 2016-04-16: 1000 ug via INTRAMUSCULAR

## 2016-04-16 NOTE — Progress Notes (Signed)
Patient ID: Debra [name], female   DOB: 12-02-1995, 20 y.o.   MRN: [id number] Pt presents for weight, B/P, B-12 injection. No side effects of medication-Phentermine, or B-12.  Weight gain of _3_ lbs. Encouraged eating healthy and exercise. Pt states that the right side of throat sore, achy and she wondered if this could be her thyroid and may check with her endocrinologist. Also has numbness/sharp pain below front right knee to top of foot. No change in exercise. Is having difficulty making time for the gym and meal/snack planning. She also c/o bruising more noticeable than before. Pt would like to know your thought about the numbness.

## 2016-05-14 ENCOUNTER — Ambulatory Visit (INDEPENDENT_AMBULATORY_CARE_PROVIDER_SITE_OTHER): Payer: 59 | Admitting: Obstetrics and Gynecology

## 2016-05-14 VITALS — BP 117/78 | HR 94 | Ht 68.5 in | Wt 166.9 lb

## 2016-05-14 DIAGNOSIS — E663 Overweight: Secondary | ICD-10-CM | POA: Diagnosis not present

## 2016-05-14 MED ORDER — CYANOCOBALAMIN 1000 MCG/ML IJ SOLN
1000.0000 ug | Freq: Once | INTRAMUSCULAR | Status: AC
  Administered 2016-05-14: 1000 ug via INTRAMUSCULAR

## 2016-05-27 ENCOUNTER — Telehealth: Payer: Self-pay | Admitting: Internal Medicine

## 2016-05-27 ENCOUNTER — Telehealth: Payer: Self-pay

## 2016-05-27 NOTE — Telephone Encounter (Signed)
It could be from the thyroid Mild: See Dr Everitt AmberGherghe Mod or severe: go to ER

## 2016-05-27 NOTE — Telephone Encounter (Signed)
Please advise in Dr.Gherghe's absence; patient called states that she is having irregular breathing. States she it feels like she can't catch her breath, has some slight chest pains when that is going on, and her pulse has been in the high 100's. Patient was not sure if this was a thyroid issue, but I advised her to notify her PCP office as well. 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 Thank you!

## 2016-05-27 NOTE — Telephone Encounter (Signed)
Pt called in very concerned, she said her breathing is off and she feels as if she cannot catch her breath.  She said she doesn't want to do anything that could make it worse, she would like to speak with someone as soon as possible.

## 2016-05-28 ENCOUNTER — Telehealth: Payer: Self-pay

## 2016-05-28 NOTE — Telephone Encounter (Signed)
I hope she is feeling better. She did not come back for labs before the summer as instructed... She absolutely needs to do this. Let's check her thyroid labs first. Labs are in.

## 2016-05-28 NOTE — Telephone Encounter (Signed)
Called patient and she will get her lab draw in [place] as that is where she lives. Orders are in chart. Patient advised if it was to become severe to go to the ER, as advised from Dr.Ellison yesterday.

## 2016-05-30 ENCOUNTER — Other Ambulatory Visit: Payer: 59

## 2016-05-30 ENCOUNTER — Other Ambulatory Visit: Payer: Self-pay | Admitting: *Deleted

## 2016-05-30 DIAGNOSIS — E0500 Thyrotoxicosis with diffuse goiter without thyrotoxic crisis or storm: Secondary | ICD-10-CM

## 2016-05-31 ENCOUNTER — Telehealth: Payer: Self-pay | Admitting: Internal Medicine

## 2016-05-31 LAB — THYROID PANEL WITH TSH
Free Thyroxine Index: 3.9 (ref 1.2–4.9)
T3 UPTAKE RATIO: 33 % (ref 24–39)
T4 TOTAL: 11.8 ug/dL (ref 4.5–12.0)

## 2016-05-31 NOTE — Telephone Encounter (Signed)
Debra FanningJulie, I reviewed the tests and the TSH is worse. In May, we increase the methimazole to 20 mg 3 times a day. If she taking this dose now? If so, the treatment is not working so she will absolutely need radioactive iodine treatment. We have discussed about this in the past, and I think it is now time to do it. Please let me know what she thinks about this.

## 2016-05-31 NOTE — Telephone Encounter (Signed)
Pt called and said that her other doctor's office informed her that her test results were in and to make sure Dr. Elvera LennoxGherghe is aware of the lab results.  Pt stated that they wanted Dr. Elvera LennoxGherghe to review the results as soon as she can.

## 2016-05-31 NOTE — Telephone Encounter (Signed)
Patient stated that her blood work results were in. From Dr Yolanda BonineMelody Burr. Please advise

## 2016-05-31 NOTE — Telephone Encounter (Signed)
See message. I contacted the patient and she stated the lab work was ordered by Harlow MaresMelody Shambley with [facility]. Patient stated it was ok for a MyChart message to be sent regarding results.   Thanks!

## 2016-06-03 NOTE — Telephone Encounter (Signed)
Sent patient a Clinical cytogeneticistmychart message regarding what Dr.Gherghe stated about her lab results. Advised patient to message back or call our office if questions, gave call back number.

## 2016-06-13 ENCOUNTER — Ambulatory Visit (INDEPENDENT_AMBULATORY_CARE_PROVIDER_SITE_OTHER): Payer: 59 | Admitting: Obstetrics and Gynecology

## 2016-06-13 VITALS — BP 120/77 | HR 96 | Wt 170.4 lb

## 2016-06-13 DIAGNOSIS — E663 Overweight: Secondary | ICD-10-CM | POA: Diagnosis not present

## 2016-06-13 MED ORDER — CYANOCOBALAMIN 1000 MCG/ML IJ SOLN
1000.0000 ug | Freq: Once | INTRAMUSCULAR | Status: AC
  Administered 2016-06-13: 1000 ug via INTRAMUSCULAR

## 2016-06-13 NOTE — Progress Notes (Signed)
Pt presents for weight, B/P, B-12 injection. No side effects of medication-Phentermine, or B-12.  Weight gain of _4_ lbs. Encouraged eating healthy and exercise.

## 2016-06-25 ENCOUNTER — Ambulatory Visit (INDEPENDENT_AMBULATORY_CARE_PROVIDER_SITE_OTHER): Payer: 59 | Admitting: Internal Medicine

## 2016-06-25 ENCOUNTER — Ambulatory Visit: Payer: 59 | Admitting: Endocrinology

## 2016-06-25 ENCOUNTER — Encounter: Payer: Self-pay | Admitting: Internal Medicine

## 2016-06-25 VITALS — BP 122/82 | HR 84 | Ht 68.5 in | Wt 174.0 lb

## 2016-06-25 DIAGNOSIS — E0500 Thyrotoxicosis with diffuse goiter without thyrotoxic crisis or storm: Secondary | ICD-10-CM | POA: Diagnosis not present

## 2016-06-25 NOTE — Progress Notes (Signed)
Patient ID: Debra [name], female   DOB: [birth year], 20 y.o.   MRN: [id number]    HPI  Debra [name] is a 20 y.o.-year-old female, returning for follow up for uncontrolled Graves ds. Last visit 6 months ago.  I reviewed and addended history: She started to have mood swings and anxiety episodes in 2014.She then developed heat intolerance. She has been having tremors for the last 3 years. She also started to have heart racing, palpitations, fatigue, feeling hot. Sxs better in last few mo.  She started MMI 10 mg in am in 12/2014 >> we decreased the dose to 5 mg 2x a day, as her TFTs were improving in 02/2015, however, in 04/2015, her TFTs were still abnormal >> increased MMI to 10 mg 2x a day >>  20-10-20 mg >> 20 mg 3x a day in 12/2015.  She is also on atenolol 25 mg 2x a day in 02/2015 >> she continues on this today.  She had anxiety attacks in the past, and continues to have anxiety and tremors.  I reviewed pt's thyroid tests: Lab Results  Component Value Date   TSH <0.006 (L) 05/30/2016   TSH 0.05 (L) 12/19/2015   TSH 0.12 (L) 10/25/2015   TSH 0.25 (L) 09/01/2015   TSH 0.08 (L) 07/18/2015   TSH 0.20 (L) 05/31/2015   TSH 0.14 (L) 04/28/2015   TSH 0.19 (L) 03/01/2015   TSH 0.005 (L) 01/10/2015   TSH 0.00 (A) 12/22/2014   FREET4 2.03 (H) 12/19/2015   FREET4 1.39 10/25/2015   FREET4 1.60 09/01/2015   FREET4 2.80 (H) 07/18/2015   FREET4 2.47 (H) 05/31/2015   FREET4 2.34 (H) 04/28/2015   FREET4 1.75 (H) 03/01/2015   FREET4 2.35 (H) 01/10/2015  01/10/2015: TSI 588 12/08/14: TSH 0.006, TT4 11.5, T3U 33%,FTI 3.8 TgAB<1 Remainder panel was normal for E2, Progesterone, PRL, testosterone  Thyroid US, [facility] (12/30/14) - normal, except heterogeneity: Right thyroid lobe Measurements: 4.0 x 1.3 x 1.3 cm. No nodules visualized. Mildly heterogeneous tissue.  Left thyroid lobe Measurements: 4.3 x 1.0 x 1.3 cm. No nodules visualized. Mildly heterogeneous tissue.  Isthmus Thickness:  3 mm. No nodules visualized.  Lymphadenopathy: None visualized.  IMPRESSION: No evidence of thyroid nodule. Mildly heterogeneous glandular tissue. Normal size.  Thyroid Uptake and scan (01/13/2015): The thyroid scan demonstrates fairly symmetric and homogeneous uptake in the thyroid gland. No hot or cold nodules are demonstrated.  4 hour I 131 uptake = 51.2% (normal 5-20%)  24 hour I 131 uptake = 55.8% (normal 10-30%)  IMPRESSION: Elevated 4 and 24 hr iodine 123 uptake consistent with hyperthyroidism/Graves disease.  Pt denies feeling nodules in neck, hoarseness, dysphagia/odynophagia, SOB with lying down; she feels much better, but still c/o: - + excessive sweating/heat intolerance - + tremors - improved fatigue - improved anxiety - nopalpitations - no SOB - no hyperdefecation - no weight loss - no hair loss - More regular menses  Pt does have a FH of thyroid ds. - hyper- or hypo-thyroidism in GGM. No FH of thyroid cancer. No h/o radiation tx to head or neck.  No seaweed or kelp, no recent contrast studies. No steroid use. No herbal supplements. No Biotin use.  ROS: Constitutional: see HPI Eyes: no blurry vision, no xerophthalmia ENT: no sore throat, no nodules palpated in throat, no dysphagia/odynophagia, no hoarseness Cardiovascular: no CP/SOB/palpitations/no leg swelling Respiratory: no cough/SOB/ no  wheezing Gastrointestinal: no N/V/D/C Musculoskeletal: no muscle/joint aches Skin: no rashes, Neurological: + tremors/no numbness/tingling/dizziness  I  reviewed pt's medications, allergies, PMH, social hx, family hx, and changes were documented in the history of present illness. Otherwise, unchanged from my initial visit note.  Past Medical History:  Diagnosis Date  . Abnormal TSH   . Anxiety   . Depression   . History of irregular menstrual cycles   . Irregular menses   . Thyroid disease    Past Surgical History:  Procedure Laterality Date  .  TONSILLECTOMY AND ADENOIDECTOMY  2012   History   Social History  . Marital Status: Single    Spouse Name: N/A  . Number of Children: 0   Occupational History  . student   Social History Main Topics  . Smoking status: Never Smoker   . Smokeless tobacco: Never Used  . Alcohol Use: Yes  . Drug Use: No   Current Outpatient Prescriptions on File Prior to Visit  Medication Sig Dispense Refill  . atenolol (TENORMIN) 25 MG tablet Take 1 tablet (25 mg total) by mouth 2 (two) times daily. 180 tablet 0  . cyanocobalamin (,VITAMIN B-12,) 1000 MCG/ML injection Inject 1 mL (1,000 mcg total) into the muscle once. 10 mL 1  . methimazole (TAPAZOLE) 10 MG tablet TAKE 20 MG IN THE AM, 20 MG WITH LUNCH AND 20 MG WITH DINNER. 180 tablet 1  . methimazole (TAPAZOLE) 10 MG tablet TAKE 20 MG BY MOUTH IN THE AM, 10 MG WITH LUNCH AND 20 MG WITH DINNER. 150 tablet 1  . phentermine (ADIPEX-P) 37.5 MG tablet Take 1 tablet (37.5 mg total) by mouth daily before breakfast. 30 tablet 2   No current facility-administered medications on file prior to visit.    Allergies  Allergen Reactions  . Sulfa Antibiotics Rash   Family History  Problem Relation Age of Onset  . Thyroid disease Other     PE: BP 122/82 (BP Location: Right Arm, Patient Position: Sitting)   Pulse 84   Ht 5' 8.5" (1.74 m)   Wt 174 lb (78.9 kg)   SpO2 98%   BMI 26.07 kg/m  Body mass index is 26.07 kg/m. Wt Readings from Last 3 Encounters:  06/25/16 174 lb (78.9 kg)  06/13/16 170 lb 6.4 oz (77.3 kg)  05/14/16 166 lb 14.4 oz (75.7 kg)   Constitutional: normal weight, in NAD Eyes: PERRLA, EOMI, no exophthalmos, no lid lag, no stare ENT: moist mucous membranes, no thyromegaly, no cervical lymphadenopathy Cardiovascular: RRR, No MRG, no leg swelling Respiratory: CTA B Gastrointestinal: abdomen soft, NT, ND, BS+ Musculoskeletal: no deformities, strength intact in all 4 Skin: moist, warm, no rashes Neurological: + tremor with  outstretched hands, DTR normal in all 4  ASSESSMENT: 1. Graves ds  PLAN:  1. Patient with Graves ds., with thyrotoxic sxs: fatigue, heat intolerance, tremors, palpitations, anxiety, not responding very well to methimazole >> last TSH was undetectable on methimazole 20 mg 3 times a day. - She is here with her parents. We have discussed extensively in the past (with her and other family members) about the risk of arrhythmia, CHF, and sudden death from uncontrolled Graves' disease. Despite multiple discussions about RAI therapy or surgery, she kept refusing definitive treatment for her condition. She wanted to stay with methimazole, however we have her on a high dose of methimazole for now and her TSH is still undetectable. She mentions that she may have missed a few doses, but not consistently. At today's visit, she mentions that she knows many people that have had either RAI treatment or surgery and they  had severe side effects from the treatments. When asked about the side effects, patient mentions weight gain. I explained that weight gain is a consequence of improving the original weight loss usually seen with Graves' disease. I explained at length again about risk and benefits of both RAI treatment and surgery; the fact that she will most likely need to be on levothyroxine afterwards; the risk of Graves' disease recurrence after RAI treatment - and possible intervention for this scenario; we discussed about the possibility of having trans-axillar surgery performed at [facility] (Dr. Jeanella AntonMichael Stang) since she is concerned about the possible scar. I also explained that if she does not agree with any of the definitive treatments above, I will need to step out of her care, since she is putting herself in a risky situation. Her parents assure me that they would go home and discuss about their options and let me know about what they decide by the end of the week. - They were also provided with written materials to  read about RAI treatment and thyroidectomy - If she decides for thyroidectomy, we will need to pretreat her with potassium iodide to normalize her tests so that she is safe for anesthesia -  Continue beta blocker (Atenolol 25 mg bid).  She is not tachycardic today. She stopped phentermine since last visit. - I would not repeat her TFTs today - RTC in 3 months  - time spent with the patient and her parents: 40 minutes, of which >50% was spent in educating them about patient's Graves' disease, the risk of cardiac arrhythmia, congestive heart failure and sudden death if this continues to remain uncontrolled, and discussing different modalities of permanent treatment and their consequences including on her fertility; they had a number of questions which I addressed.   Carlus Pavlovristina Jowell Bossi, MD PhD [facility]

## 2016-06-25 NOTE — Patient Instructions (Signed)
Please continue Methimazole 20 mg 3x a day.  Please make a decision about RAI treatment vs. Thyroid surgery.  Please come back for labs in 1 month after RAI treatment or the surgery.  Please come back for a follow-up appointment in 3 months.   Radioiodine (I-131) Therapy for Hyperthyroidism Radioiodine (I-131) therapy for hyperthyroidism is a procedure used to treat an overactive thyroid (hyperthyroidism). The patient swallows I-131, which is a radioactive form of iodine. The I-131 destroys thyroid cells. The thyroid is a gland in the neck. It makes thyroid hormones, which control how cells throughout the body use energy. Hyperthyroidism is usually caused by Grave's disease or by growths within the thyroid (nodules). Symptoms may include:  Nervousness.  Irritability.  Problems with sleep.  Tiredness.  Fast heart rate.  Shaky hands.  Sweating.  Heat sensitivity.  Unintended weight loss.  Brittle hair.  Enlarged thyroid gland.  Menstrual changes.  Frequent stools. LET YOUR CAREGIVER KNOW ABOUT:   All allergies.  All medications that you are taking, including over-the-counter and prescription drugs, dietary supplements, vitamins, or herbal preparations.  Any previous complications from this or other procedures.  Smoking history.  Possibility of pregnancy.  History of bleeding problems.  Any other health problems. RISKS AND COMPLICATIONS  Risks of the procedure include:  Slight pain in the area of the thyroid gland. BEFORE THE PROCEDURE  If you are a woman, you may be asked to have pregnancy testing before the procedure.  If you have been taking thyroid medicines, you will usually be asked to stop them three days before your procedure.  You will usually be asked to stop eating and drinking at midnight the day of your procedure.  You will need to plan for someone to drive you home after the procedure. PROCEDURE  You will be asked to swallow the  radioactive iodine in either pill or liquid form. It can take 1-3 months for the treatment to work. The treatment is most effective at about 3-6 months after the dose of iodine has been given. In most people, a single dose of radioactive iodine resolves their hyperthyroidism, but a few people will require a second dose.  AFTER THE PROCEDURE  Because you will be giving off tiny amounts of radiation for several days, there are some special precautions you will be asked to follow for about 2-4 days after the procedure:  Avoid being around babies or pregnant women.  Do not use public bathrooms.  Flush twice after using the toilet.  Take a bath or shower every day.  Practice good hand washing.  Drink fluids normally.  Use disposable utensils, or clean your utensils separately from those of others.  Sleep alone.  Do not engage in intimate contact.  Wash your sheets, towels, and clothes each day, by themselves.  Do not make food for other people. Other precautions include:  Stopping breastfeeding.  Do not attempt to become pregnant for at least a year after you have had the procedure.  When traveling, bring a letter of explanation from your caregiver for three months. Radioactivity may trip detectors in airports or other places. Because the point of the procedure is to destroy your thyroid gland, you will need to take thyroid hormone by mouth for the rest of your life. HOME CARE INSTRUCTIONS   Ask your caregiver when you should resume or start thyroid medications.  Take all medications exactly as directed.  Follow any prescribed diet.  Follow instructions regarding both rest and physical activity. SEEK  IMMEDIATE MEDICAL CARE IF:  You have a dry mouth.  You have a sore throat.  You have neck pain.  You have a tight sensation in the throat.  You have nausea and vomiting.  You are fatigued.  You have flushing.  You have bowel changes (either diarrhea or  constipation).   This information is not intended to replace advice given to you by your health care provider. Make sure you discuss any questions you have with your health care provider.   Document Released: 12/22/2008 Document Revised: 10/28/2011 Document Reviewed: 11/30/2014 Elsevier Interactive Patient Education [employer]   Refer to this sheet in the next few weeks. These instructions provide you with information about caring for yourself after your procedure. Your health care provider may also give you more specific instructions. Your treatment has been planned according to current medical practices, but problems sometimes occur. Call your health care provider if you have any problems or questions after your procedure.  WHAT TO EXPECT AFTER THE PROCEDURE  After your procedure, it is common to have a sore throat or mild neck pain for several months.  HOME CARE INSTRUCTIONS For the First 48 Hours After the Procedure:  Do not use public bathrooms.  Flush twice after using the toilet.  Take a bath or shower every day.  Do not make food for other people.  Wash your sheets, towels, and clothes each day, by themselves. Pregnancy and Breastfeeding  Do not attempt to have children for as long as you are told by your health care provider. This may be for up to 1 year after your procedure. Use a method of birth control (contraception) to prevent pregnancy. Talk to your health care provider about what form of contraception is right for you.  Do not breastfeed for as long as you are told by your health care provider, if this applies. General Instructions  Avoid close contact with other people. It is most important to avoid contact with pregnant women and children. Do this for 1 week after your procedure.  Try to keep a distance of about 3 feet from others.  Sleep alone. Do not have intimate contact.  If you have children, arrange for child care.  Do not ride in vehicles with  other people.  Do not stay in a hotel.  Take over-the-counter and prescription medicines only as told by your health care provider. This includes any thyroid medicines.  When traveling, carry a note from your health care provider to explain that you have had radioiodine therapy. This is because radioactivity may set off detectors in airports or other places.  Do not share utensils, such as silverware, plates, or cups. Use disposable utensils, or clean your utensils separately from those of others.  Wash your hands often. If soap and water are not available, use hand sanitizer.  Drink enough fluid to keep your urine clear or pale yellow.  Keep all follow-up visits as told by your health care provider. This is important. Follow-up visits may be required every 1-2 months after treatment. SEEK MEDICAL CARE IF:   You have pain that gets worse or does not get better with medicine.  You have a dry mouth.  You lose your sense of taste.  You become pregnant within 1 year of your procedure, if this applies.  You feel unusually tired (fatigued).  You have very dry skin.  You start to lose your hair.  You have bowel movements that are less frequent or more difficult than  usual (constipation).  You have unexplained weight gain.  You always feel cold.   This information is not intended to replace advice given to you by your health care provider. Make sure you discuss any questions you have with your health care provider.   Document Released: 04/26/2015 Document Reviewed: 11/30/2014 Elsevier Interactive Patient Education [employer]   Thyroidectomy A thyroidectomy is a surgery that is done to remove all (total thyroidectomy) or part (subtotal thyroidectomy) of your thyroid gland. The thyroid is a butterfly-shaped gland that is located at the lower front of your neck. It produces a substance that helps to control certain body processes (thyroid hormone). The amount of thyroid  gland tissue that is removed during your thyroidectomy depends on the reason you need the procedure. You may have a thyroidectomy to treat conditions including:  Thyroid nodules.  Thyroid cancer.  Benign thyroid tumors.  Goiter.  Overactive thyroid gland (hyperthyroidism). There are different ways to do a thyroidectomy:   Conventional thyroidectomy (open thyroidectomy). This procedure is the most common. In this procedure, the thyroid gland is removed through one surgical cut (incision) in the neck.  Endoscopic thyroidectomy. This procedure is less invasive. In this procedure, there may be several smaller incisions in the neck, chest, or armpit. The surgeon uses a tiny camera and other assistive tools to remove the thyroid gland. LET [facility] CARE PROVIDER KNOW ABOUT:   Any allergies you have.  All medicines you are taking, including vitamins, herbs, eye drops, creams, and over-the-counter medicines.  Previous problems you or members of your family have had with the use of anesthetics.  Any blood disorders you have.  Previous surgeries you have had.  Medical conditions you have. RISKS AND COMPLICATIONS Generally, this is a safe procedure. However, problems can occur and include:  A decrease in parathyroid hormone levels (hypoparathyroidism). Your parathyroid glands are located behind your thyroid gland, and they maintain the calcium level in your body. If these glands are damaged during surgery, your calcium level will drop. This will make your nerves irritable and cause muscle spasms.  An increase in thyroid hormone.  Damage to the nerves of your voice box (larynx).  Bleeding.  Infection at the site of the incision or incisions.  Temporary breathing difficulties. This is a very rare complication. It usually goes away within weeks. BEFORE THE PROCEDURE  Your health care provider will perform a physical exam and assess your voice for vocal changes.  Ask your health  care provider about:  Changing or stopping your regular medicines. This is especially important if you are taking diabetes medicines or blood thinners.  Taking medicines such as aspirin and ibuprofen. These medicines can thin your blood. Do not take these medicines before your procedure if your health care provider instructs you not to.  Follow instructions from your health care provider about eating or drinking restrictions. PROCEDURE You will be given a medicine that makes you go to sleep (general anesthetic). Depending on which type of thyroidectomy you have, this is what may happen during the procedure: Conventional Thyroidectomy  The surgeon will make an incision in the center of your lower neck.  The muscles in your neck will be separated to reveal your thyroid gland.  Part or all of your thyroid gland will be removed.  You may need a tube (catheter) at the incision site to drain blood and fluids that accumulate under the skin after the procedure.The catheter may have to stay in place for a day or  two after the procedure.  The incision will be closed with stitches (sutures). Endoscopic Thyroidectomy  The surgeon will make several small incisions in your neck, chest, or armpit.  The surgeon will use a narrow tube with a light and camera at the end (endoscope). The surgeon will insert the endoscope into an incision.  Part or all of your thyroid gland will be removed.  You may need a catheter at the incision site to drain blood and fluids that accumulate under the skin after the procedure.The catheter may have to stay in place for a day or two after the procedure.  The incision will be closed with sutures. AFTER THE PROCEDURE  Your blood pressure, heart rate, breathing rate, and blood oxygen level will be monitored often until the medicines you were given have worn off.  Depending on the type of thyroidectomy you had, you may have:  A swollen neck.  Some mild neck  pain.  A slightly sore throat.  A weak voice.  You will not be able to eat or drink until your health care provider says it is okay.  You may have a blood test to check the level of calcium in your body.  If you had a catheter put in during the procedure, it will usually be removed the next day.   This information is not intended to replace advice given to you by your health care provider. Make sure you discuss any questions you have with your health care provider.   Document Released: 01/29/2001 Document Revised: 12/20/2014 Document Reviewed: 01/05/2014 Elsevier Interactive Patient Education 2016 Else[employer] Inc[employer]  Thyroidectomy, Care After Refer to this sheet in the next few weeks. These instructions provide you with information about caring for yourself after your procedure. Your health care provider may also give you more specific instructions. Your treatment has been planned according to current medical practices, but problems sometimes occur. Call your health care provider if you have any problems or questions after your procedure. WHAT TO EXPECT AFTER THE PROCEDURE After your procedure, it is typical to have:  Mild pain in the neck or upper body, especially when swallowing.  A sore throat.  A weak voice. HOME CARE INSTRUCTIONS   Take medicines only as directed by your health care provider.  If your entire thyroid gland was removed, you may need to take thyroid hormone medicine from now on.  Do not take medicines that contain aspirin and ibuprofen until your health care provider says that you can. These medicines can increase your risk of bleeding.  Some pain medicines cause constipation. Drink enough fluid to keep your urine clear or pale yellow. This can help to prevent constipation.  Start slowly with eating. You may need to have only liquids and soft foods for a few days or as directed by your health care provider.  Do not take baths, swim, or use a hot tub until your  health care provider approves.  There are many different ways to close and cover an incision, including stitches (sutures), skin glue, and adhesive strips. Follow your health care provider's instructions for:  Incision care.  Bandage (dressing) changes and removal.  Incision closure removal.  Resume your usual activities as directed by your health care provider.  For the first 10 days after the procedure or as instructed by your health care provider:  Do not lift anything heavier than 20 lb (9.1 kg).  Do not jog, swim, or do other strenuous exercises.  Do not play contact sports.  Keep all follow-up visits as directed by your health care provider. This is important. SEEK MEDICAL CARE IF:  The soreness in your throat gets worse.  You have increased pain at your incision or incisions.  You have increased bleeding from an incision.  Your incision becomes infected. Watch for:  Swelling.  Redness.  Warmth.  Pus.  You notice a bad smell coming from an incision or dressing.  You have a fever.  You feel lightheaded or faint.  You have numbness, tingling, or muscle spasms in your:  Arms.  Hands.  Feet.  Face.  You have trouble swallowing. SEEK IMMEDIATE MEDICAL CARE IF:   You develop a rash.  You have difficulty breathing.  You hear whistling noises coming from your chest.  You develop a cough that gets worse.  Your speech changes, or you have hoarseness that gets worse.   This information is not intended to replace advice given to you by your health care provider. Make sure you discuss any questions you have with your health care provider.   Document Released: 02/22/2005 Document Revised: 08/26/2014 Document Reviewed: 01/05/2014 Elsevier Interactive Patient Education [employer]

## 2016-06-26 ENCOUNTER — Other Ambulatory Visit: Payer: Self-pay | Admitting: Internal Medicine

## 2016-06-26 DIAGNOSIS — E0500 Thyrotoxicosis with diffuse goiter without thyrotoxic crisis or storm: Secondary | ICD-10-CM

## 2016-07-01 ENCOUNTER — Encounter: Payer: Self-pay | Admitting: Internal Medicine

## 2016-07-03 NOTE — Telephone Encounter (Signed)
Dr. Vilma PraderSting is requesting a CD of the thyroid ultrasound   Given number and info for our medical records dept

## 2016-07-08 ENCOUNTER — Other Ambulatory Visit: Payer: Self-pay | Admitting: Internal Medicine

## 2016-07-08 MED ORDER — METHIMAZOLE 10 MG PO TABS
ORAL_TABLET | ORAL | 1 refills | Status: DC
Start: 2016-07-08 — End: 2017-01-23

## 2016-07-08 NOTE — Telephone Encounter (Signed)
Pt called and said that she is about to go out of town today at last minute and needs her Methimazole refilled and sent to the [facility] [address] in [place] as soon as possible.

## 2016-07-08 NOTE — Telephone Encounter (Signed)
Patient called in for refill on methimazole. Please see attached rx.

## 2016-07-10 NOTE — Telephone Encounter (Signed)
Done

## 2016-07-13 ENCOUNTER — Encounter: Payer: Self-pay | Admitting: Internal Medicine

## 2016-07-24 ENCOUNTER — Encounter: Payer: 59 | Admitting: Obstetrics and Gynecology

## 2016-07-30 ENCOUNTER — Telehealth: Payer: Self-pay | Admitting: Internal Medicine

## 2016-07-30 NOTE — Telephone Encounter (Signed)
Pt's mother called in and said that Pt had her thyroid taken out today and she said that she was told to follow up with Dr. Elvera LennoxGherghe regarding continuing taking the Atenolol.  Pt's blood pressure has been in check lately and that this morning it was actually low to where she did not take the medication. She was just wondering that now her thyroid is removed, will she need to continue this medication or will she stop it abruptly or how to proceed?  Please advise.  Can call Pt's mother back on her cell.

## 2016-07-30 NOTE — Telephone Encounter (Signed)
Please have her continue to check her pulse at rest and if it is more than 90 bpm repeatedly, then she needs to take the atenolol daily. If is less than 90, she can stop.

## 2016-07-31 NOTE — Telephone Encounter (Signed)
Spoke with Pt's mother, advised her of what Dr. Elvera LennoxGherghe had said about checking her pulse rate.  Pt's mother voiced understanding.  She was advised to call back if any other questions came up.

## 2016-07-31 NOTE — Telephone Encounter (Signed)
LVM with [facility] for her to call back to discuss.

## 2016-08-02 ENCOUNTER — Encounter: Payer: Self-pay | Admitting: Internal Medicine

## 2016-08-09 ENCOUNTER — Encounter: Payer: Self-pay | Admitting: Internal Medicine

## 2016-08-09 DIAGNOSIS — E890 Postprocedural hypothyroidism: Secondary | ICD-10-CM | POA: Insufficient documentation

## 2016-08-23 ENCOUNTER — Telehealth: Payer: Self-pay

## 2016-08-23 NOTE — Telephone Encounter (Signed)
Yes, that is fine, I just want to make sure that she has follow-up labs after her surgery and I can see her in the following months then.

## 2016-08-23 NOTE — Telephone Encounter (Signed)
Called patient after she did not read mychart message from Dr.Gherghe. Advised her that she would need labs done, and an appointment to follow up. She stated that the surgeon wants to have labs drawn too Jan. 20th, is it okay to just do those labs. Also stated she would call back after speaking with her mom to make an appointment. No other questions or concerns at this time.

## 2016-09-18 DIAGNOSIS — E890 Postprocedural hypothyroidism: Secondary | ICD-10-CM | POA: Diagnosis not present

## 2016-10-15 ENCOUNTER — Telehealth: Payer: Self-pay | Admitting: Obstetrics and Gynecology

## 2016-10-15 NOTE — Telephone Encounter (Signed)
Spoke with pt she is wanting to start weight loss medication had her thyroid removed in Dec

## 2016-10-15 NOTE — Telephone Encounter (Signed)
PT CALLED AND IS REQ A CALL BACK

## 2016-11-06 DIAGNOSIS — E0500 Thyrotoxicosis with diffuse goiter without thyrotoxic crisis or storm: Secondary | ICD-10-CM | POA: Diagnosis not present

## 2016-11-06 DIAGNOSIS — E0590 Thyrotoxicosis, unspecified without thyrotoxic crisis or storm: Secondary | ICD-10-CM | POA: Diagnosis not present

## 2016-11-06 DIAGNOSIS — E890 Postprocedural hypothyroidism: Secondary | ICD-10-CM | POA: Diagnosis not present

## 2016-11-25 ENCOUNTER — Encounter: Payer: Self-pay | Admitting: *Deleted

## 2016-11-25 NOTE — Progress Notes (Signed)
This encounter was created in error - please disregard.

## 2016-12-31 ENCOUNTER — Other Ambulatory Visit: Payer: 59

## 2016-12-31 DIAGNOSIS — R946 Abnormal results of thyroid function studies: Secondary | ICD-10-CM | POA: Diagnosis not present

## 2016-12-31 DIAGNOSIS — R7989 Other specified abnormal findings of blood chemistry: Secondary | ICD-10-CM

## 2017-01-01 LAB — THYROID PANEL WITH TSH
FREE THYROXINE INDEX: 2.5 (ref 1.2–4.9)
T3 UPTAKE RATIO: 28 % (ref 24–39)
T4 TOTAL: 8.9 ug/dL (ref 4.5–12.0)
TSH: 14.98 u[IU]/mL — AB (ref 0.450–4.500)

## 2017-01-02 ENCOUNTER — Telehealth: Payer: Self-pay

## 2017-01-02 ENCOUNTER — Telehealth: Payer: Self-pay | Admitting: Internal Medicine

## 2017-01-02 MED ORDER — LEVOTHYROXINE SODIUM 112 MCG PO TABS: 112.0000 ug | ORAL_TABLET | Freq: Every day | ORAL | 1 refills | Status: DC

## 2017-01-02 NOTE — Telephone Encounter (Signed)
Called patient and explained the lab results. Patient is taking the 100 mcg, so I increased the dose to 112mcg and submitted to pharmacy. Patient is going out of town, and will call when she gets back to let us know how she feels and to make lab appointment. Patient states has irritability, is extremely tired, and shaky, she wanted you aware of her symptoms.   

## 2017-01-02 NOTE — Telephone Encounter (Signed)
Please advise. Thank you

## 2017-01-02 NOTE — Telephone Encounter (Signed)
She never returned to see me after surgery, despite multiple promptings. It is very difficult for me to adjust her medication without seeing her and knowing exactly how she is taking the levothyroxine. For now, if she is taking levothyroxine 100 g daily (I am not even sure if this is the correct dose), we can increase to 112 g and have her back in 2 months for labs: TSH and free T4. Alternatively, she can have this checked at her other doctor's office. However, I need to see her for an appointment soon for further adjustments.

## 2017-01-02 NOTE — Telephone Encounter (Signed)
Called patient and explained the lab results. Patient is taking the 100 mcg, so I increased the dose to 112mcg and submitted to pharmacy. Patient is going out of town, and will call when she gets back to let us know how she feels and to make lab appointment. Patient states has irritability, is extremely tired, and shaky, she wanted you aware of her symptoms.

## 2017-01-02 NOTE — Telephone Encounter (Signed)
Pt called in and said that she just had her labs done since having her surgery, the doctor that she did them through wanted Dr. Elvera LennoxGherghe to look at the labs and review them because they didn't come back as great as they would have hoped.  Pt is leaving to go to [place] this weekend and is concerned that something might be wrong, she would like to know what Dr. Elvera LennoxGherghe thinks and would like a call back as soon as possible.

## 2017-01-23 ENCOUNTER — Encounter: Payer: Self-pay | Admitting: Certified Nurse Midwife

## 2017-01-23 ENCOUNTER — Ambulatory Visit (INDEPENDENT_AMBULATORY_CARE_PROVIDER_SITE_OTHER): Payer: 59 | Admitting: Certified Nurse Midwife

## 2017-01-23 VITALS — BP 116/82 | HR 87 | Wt 181.1 lb

## 2017-01-23 DIAGNOSIS — N898 Other specified noninflammatory disorders of vagina: Secondary | ICD-10-CM | POA: Diagnosis not present

## 2017-01-23 DIAGNOSIS — R300 Dysuria: Secondary | ICD-10-CM

## 2017-01-23 DIAGNOSIS — N921 Excessive and frequent menstruation with irregular cycle: Secondary | ICD-10-CM

## 2017-01-23 LAB — POCT URINALYSIS DIPSTICK
BILIRUBIN UA: NEGATIVE
Blood, UA: NEGATIVE
Glucose, UA: NEGATIVE
KETONES UA: NEGATIVE
Leukocytes, UA: NEGATIVE
Nitrite, UA: NEGATIVE
PH UA: 6.5 (ref 5.0–8.0)
Spec Grav, UA: 1.015 (ref 1.010–1.025)
Urobilinogen, UA: 0.2 E.U./dL

## 2017-01-23 LAB — POCT URINE PREGNANCY: Preg Test, Ur: NEGATIVE

## 2017-01-23 MED ORDER — TERCONAZOLE 0.4 % VA CREA: 1.0000 | TOPICAL_CREAM | Freq: Every day | VAGINAL | 0 refills | Status: DC

## 2017-01-23 MED ORDER — NITROFURANTOIN MONOHYD MACRO 100 MG PO CAPS: 100.0000 mg | ORAL_CAPSULE | Freq: Two times a day (BID) | ORAL | 1 refills | Status: DC

## 2017-01-23 MED ORDER — NORETHIN-ETH ESTRAD-FE BIPHAS 1 MG-10 MCG / 10 MCG PO TABS: 1.0000 | ORAL_TABLET | Freq: Every day | ORAL | 6 refills | Status: DC

## 2017-01-23 NOTE — Patient Instructions (Signed)
Vaginitis Vaginitis is a condition in which the vaginal tissue swells and becomes red (inflamed). This condition is most often caused by a change in the normal balance of bacteria and yeast that live in the vagina. This change causes an overgrowth of certain bacteria or yeast, which causes the inflammation. There are different types of vaginitis, but the most common types are:  Bacterial vaginosis.  Yeast infection (candidiasis).  Trichomoniasis vaginitis. This is a sexually transmitted disease (STD).  Viral vaginitis.  Atrophic vaginitis.  Allergic vaginitis.  What are the causes? The cause of this condition depends on the type of vaginitis. It can be caused by:  Bacteria (bacterial vaginosis).  Yeast, which is a fungus (yeast infection).  A parasite (trichomoniasis vaginitis).  A virus (viral vaginitis).  Low hormone levels (atrophic vaginitis). Low hormone levels can occur during pregnancy, breastfeeding, or after menopause.  Irritants, such as bubble baths, scented tampons, and feminine sprays (allergic vaginitis).  Other factors can change the normal balance of the yeast and bacteria that live in the vagina. These include:  Antibiotic medicines.  Poor hygiene.  Diaphragms, vaginal sponges, spermicides, birth control pills, and intrauterine devices (IUD).  Sex.  Infection.  Uncontrolled diabetes.  A weakened defense (immune) system.  What increases the risk? This condition is more likely to develop in women who:  Smoke.  Use vaginal douches, scented tampons, or scented sanitary pads.  Wear tight-fitting pants.  Wear thong underwear.  Use oral birth control pills or an IUD.  Have sex without a condom.  Have multiple sex partners.  Have an STD.  Frequently use the spermicide nonoxynol-9.  Eat lots of foods high in sugar.  Have uncontrolled diabetes.  Have low estrogen levels.  Have a weakened immune system from an immune disorder or medical  treatment.  Are pregnant or breastfeeding.  What are the signs or symptoms? Symptoms vary depending on the cause of the vaginitis. Common symptoms include:  Abnormal vaginal discharge. ? The discharge is white, gray, or yellow with bacterial vaginosis. ? The discharge is thick, white, and cheesy with a yeast infection. ? The discharge is frothy and yellow or greenish with trichomoniasis.  A bad vaginal smell. The smell is fishy with bacterial vaginosis.  Vaginal itching, pain, or swelling.  Sex that is painful.  Pain or burning when urinating.  Sometimes there are no symptoms. How is this diagnosed? This condition is diagnosed based on your symptoms and medical history. A physical exam, including a pelvic exam, will also be done. You may also have other tests, including:  Tests to determine the pH level (acidity or alkalinity) of your vagina.  A whiff test, to assess the odor that results when a sample of your vaginal discharge is mixed with a potassium hydroxide solution.  Tests of vaginal fluid. A sample will be examined under a microscope.  How is this treated? Treatment varies depending on the type of vaginitis you have. Your treatment may include:  Antibiotic creams or pills to treat bacterial vaginosis and trichomoniasis.  Antifungal medicines, such as vaginal creams or suppositories, to treat a yeast infection.  Medicine to ease discomfort if you have viral vaginitis. Your sexual partner should also be treated.  Estrogen delivered in a cream, pill, suppository, or vaginal ring to treat atrophic vaginitis. If vaginal dryness occurs, lubricants and moisturizing creams may help. You may need to avoid scented soaps, sprays, or douches.  Stopping use of a product that is causing allergic vaginitis. Then using a vaginal  cream to treat the symptoms.  Follow these instructions at home: Lifestyle  Keep your genital area clean and dry. Avoid soap, and only rinse the area  with water.  Do not douche or use tampons until your health care provider says it is okay to do so. Use sanitary pads, if needed.  Do not have sex until your health care provider approves. When you can return to sex, practice safe sex and use condoms.  Wipe from front to back. This avoids the spread of bacteria from the rectum to the vagina. General instructions  Take over-the-counter and prescription medicines only as told by your health care provider.  If you were prescribed an antibiotic medicine, take or use it as told by your health care provider. Do not stop taking or using the antibiotic even if you start to feel better.  Keep all follow-up visits as told by your health care provider. This is important. How is this prevented?  Use mild, non-scented products. Do not use things that can irritate the vagina, such as fabric softeners. Avoid the following products if they are scented: ? Feminine sprays. ? Detergents. ? Tampons. ? Feminine hygiene products. ? Soaps or bubble baths.  Let air reach your genital area. ? Wear cotton underwear to reduce moisture buildup. ? Avoid wearing underwear while you sleep. ? Avoid wearing tight pants and underwear or nylons without a cotton panel. ? Avoid wearing thong underwear.  Take off any wet clothing, such as bathing suits, as soon as possible.  Practice safe sex and use condoms. Contact a health care provider if:  You have abdominal pain.  You have a fever.  You have symptoms that last for more than 2-3 days. Get help right away if:  You have a fever and your symptoms suddenly get worse. Summary  Vaginitis is a condition in which the vaginal tissue becomes inflamed.This condition is most often caused by a change in the normal balance of bacteria and yeast that live in the vagina.  Treatment varies depending on the type of vaginitis you have.  Do not douche, use tampons , or have sex until your health care provider approves.  When you can return to sex, practice safe sex and use condoms. This information is not intended to replace advice given to you by your health care provider. Make sure you discuss any questions you have with your health care provider. Document Released: 06/02/2007 Document Revised: 09/10/2016 Document Reviewed: 09/10/2016 Elsevier Interactive Patient Education  2018 [employer] How to Take a [employer] Bath A sitz bath is a warm water bath that is taken while you are sitting down. The water should only come up to your hips and should cover your buttocks. Your health care provider may recommend a sitz bath to help you:  Clean the lower part of your body, including your genital area.  With itching.  With pain.  With sore muscles or muscles that tighten or spasm.  How to take a sitz bath Take 3-4 sitz baths per day or as told by your health care provider. 1. Partially fill a bathtub with warm water. You will only need the water to be deep enough to cover your hips and buttocks when you are sitting in it. 2. If your health care provider told you to put medicine in the water, follow the directions exactly. 3. Sit in the water and open the tub drain a little. 4. Turn on the warm water again to keep the tub at the  correct level. Keep the water running constantly. 5. Soak in the water for 15-20 minutes or as told by your health care provider. 6. After the sitz bath, pat the affected area dry first. Do not rub it. 7. Be careful when you stand up after the sitz bath because you may feel dizzy.  Contact a health care provider if:  Your symptoms get worse. Do not continue with sitz baths if your symptoms get worse.  You have new symptoms. Do not continue with sitz baths until you talk with your health care provider. This information is not intended to replace advice given to you by your health care provider. Make sure you discuss any questions you have with your health care provider. Document  Released: 04/27/2004 Document Revised: 01/03/2016 Document Reviewed: 08/03/2014 Elsevier Interactive Patient Education  2018 [employer]. Dysuria Dysuria is pain or discomfort while urinating. The pain or discomfort may be felt in the tube that carries urine out of the bladder (urethra) or in the surrounding tissue of the genitals. The pain may also be felt in the groin area, lower abdomen, and lower back. You may have to urinate frequently or have the sudden feeling that you have to urinate (urgency). Dysuria can affect both men and women, but is more common in women. Dysuria can be caused by many different things, including:  Urinary tract infection in women.  Infection of the kidney or bladder.  Kidney stones or bladder stones.  Certain sexually transmitted infections (STIs), such as chlamydia.  Dehydration.  Inflammation of the vagina.  Use of certain medicines.  Use of certain soaps or scented products that cause irritation.  Follow these instructions at home: Watch your dysuria for any changes. The following actions may help to reduce any discomfort you are feeling:  Drink enough fluid to keep your urine clear or pale yellow.  Empty your bladder often. Avoid holding urine for long periods of time.  After a bowel movement or urination, women should cleanse from front to back, using each tissue only once.  Empty your bladder after sexual intercourse.  Take medicines only as directed by your health care provider.  If you were prescribed an antibiotic medicine, finish it all even if you start to feel better.  Avoid caffeine, tea, and alcohol. They can irritate the bladder and make dysuria worse. In men, alcohol may irritate the prostate.  Keep all follow-up visits as directed by your health care provider. This is important.  If you had any tests done to find the cause of dysuria, it is your responsibility to obtain your test results. Ask the lab or department performing  the test when and how you will get your results. Talk with your health care provider if you have any questions about your results.  Contact a health care provider if:  You develop pain in your back or sides.  You have a fever.  You have nausea or vomiting.  You have blood in your urine.  You are not urinating as often as you usually do. Get help right away if:  You pain is severe and not relieved with medicines.  You are unable to hold down any fluids.  You or someone else notices a change in your mental function.  You have a rapid heartbeat at rest.  You have shaking or chills.  You feel extremely weak. This information is not intended to replace advice given to you by your health care provider. Make sure you discuss any questions you have  with your health care provider. Document Released: 05/03/2004 Document Revised: 01/11/2016 Document Reviewed: 03/31/2014 Elsevier Interactive Patient Education  [employer].

## 2017-01-25 LAB — URINE CULTURE

## 2017-01-26 NOTE — Progress Notes (Signed)
SUBJECTIVE:  Debra [name] 21 y.o. female who reports external vaginal pain and irritation with urination for the last week.   History significant for hyperthyroidism and hypothyroidism.   She restarted her birth control pills approximately one (1) month ago after six (6) months of normal cycles. She reports spotting between cycles.   Her cycles returned to normal after her thyroid was regulated.   She denies flank pain, abdominal pain, fever, chills, or abnormal vaginal discharge or bleeding.   Review of systems  ROS negative except as noted above. Information obtained from patient.   OBJECTIVE:   BP 116/82   Pulse 87   Wt 181 lb 1.6 oz (82.1 kg)   BMI 27.14 kg/m   Alert and oriented x 4, no apparent distress.   Abdomen soft, round, and non-tender. No CVA tenderness.   Urine dipstick shows positive for protein.   Negative urine pregnancy test.   Pelvic exam: VULVA: vulvar erythema present, VAGINA: vaginal discharge - clear and white, CERVIX: NuSwab collected.  ASSESSMENT:   Breakthrough bleeding on birth control pills  Burning with urination  Vaginal irritation  PLAN:  NuSwab ans urine culture sent, will contact patient with results.   Rx Macrobid and Terazol, see orders.  Encourage home treatment measures.  Reviewed red flag symptoms and when to call.  RTC x 2-3 days if symptoms worsen or fail to improve.   Debra [name] Debra [name], CNM

## 2017-01-27 LAB — NUSWAB VAGINITIS (VG)
Candida albicans, NAA: NEGATIVE
Candida glabrata, NAA: NEGATIVE
Trich vag by NAA: NEGATIVE

## 2017-03-17 ENCOUNTER — Telehealth: Payer: Self-pay | Admitting: Internal Medicine

## 2017-03-17 ENCOUNTER — Telehealth: Payer: Self-pay

## 2017-03-17 ENCOUNTER — Ambulatory Visit (INDEPENDENT_AMBULATORY_CARE_PROVIDER_SITE_OTHER): Payer: 59 | Admitting: Internal Medicine

## 2017-03-17 ENCOUNTER — Encounter: Payer: Self-pay | Admitting: Internal Medicine

## 2017-03-17 VITALS — BP 116/76 | HR 68 | Ht 68.5 in | Wt 183.0 lb

## 2017-03-17 DIAGNOSIS — R079 Chest pain, unspecified: Secondary | ICD-10-CM | POA: Diagnosis not present

## 2017-03-17 DIAGNOSIS — Z8639 Personal history of other endocrine, nutritional and metabolic disease: Secondary | ICD-10-CM

## 2017-03-17 DIAGNOSIS — E890 Postprocedural hypothyroidism: Secondary | ICD-10-CM | POA: Diagnosis not present

## 2017-03-17 LAB — T4, FREE: FREE T4: 1.18 ng/dL (ref 0.60–1.60)

## 2017-03-17 LAB — TSH: TSH: 3.37 u[IU]/mL (ref 0.35–4.50)

## 2017-03-17 MED ORDER — LEVOTHYROXINE SODIUM 112 MCG PO TABS: 112.0000 ug | ORAL_TABLET | Freq: Every day | ORAL | 1 refills | Status: DC

## 2017-03-17 NOTE — Patient Instructions (Signed)
Please stop at the lab.  Please continue Levothyroxine 112 mcg daily.  Take the thyroid hormone every day, with water, at least 30 minutes before breakfast, separated by at least 4 hours from: - acid reflux medications - calcium - iron - multivitamins  Please return in 6 months.   

## 2017-03-17 NOTE — Telephone Encounter (Signed)
Patient would like a call back from Dr. Elvera LennoxGherghe.  Thank you,  -LL

## 2017-03-17 NOTE — Progress Notes (Signed)
Patient ID: ALGIE CALES, female   DOB: [birth year], 21 y.o.   MRN: [id number]    HPI  ARES TEGTMEYER is a 21 y.o.-year-old female, returning for follow up for h/o uncontrolled Graves ds, now with . Last visit 8 months ago.  Reviewed and addended Sx: She started to have mood swings and anxiety episodes in 2014. She then developed heat intolerance. She has been having tremors for the last 3 years. She also started to have heart racing, palpitations, fatigue, feeling hot.  Thyroid US, [facility] (12/30/14) - normal, except heterogeneity: Right thyroid lobe Measurements: 4.0 x 1.3 x 1.3 cm. No nodules visualized. Mildly heterogeneous tissue.  Left thyroid lobe Measurements: 4.3 x 1.0 x 1.3 cm. No nodules visualized. Mildly heterogeneous tissue.  Isthmus Thickness: 3 mm. No nodules visualized.  Lymphadenopathy: None visualized.  IMPRESSION: No evidence of thyroid nodule. Mildly heterogeneous glandular tissue. Normal size.  Thyroid Uptake and scan (01/13/2015): The thyroid scan demonstrates fairly symmetric and homogeneous uptake in the thyroid gland. No hot or cold nodules are demonstrated.  4 hour I 131 uptake = 51.2% (normal 5-20%)  24 hour I 131 uptake = 55.8% (normal 10-30%)  IMPRESSION: Elevated 4 and 24 hr iodine 123 uptake consistent with hyperthyroidism/Graves disease.  She started MMI 10 mg in am in 12/2014 >> we decreased the dose to 5 mg 2x a day, as her TFTs were improving in 02/2015, however, in 04/2015, her TFTs were still abnormal >> increased MMI to 10 mg 2x a day >>  20-10-20 mg >> 20 mg 3x a day in 12/2015.  Despite this, her TFTs remained abnormal.   She repeatedly refused RAI tx and conventional thyroidectomy.  I then suggested transaxillary thyroidectomy, which she had at [facility] 07/29/2016 (Dr. Lovie Macadamia) >> now developed postop hypothyroidism  Pt is on levothyroxine 112 mcg daily, taken: - in am - fasting - at least 30 min from b'fast - no Ca, Fe, MVI,  PPIs - not on Biotin  I reviewed pt's thyroid tests:  Lab Results  Component Value Date   TSH 14.980 (H) 12/31/2016   TSH <0.006 (L) 05/30/2016   TSH 0.05 (L) 12/19/2015   TSH 0.12 (L) 10/25/2015   TSH 0.25 (L) 09/01/2015   TSH 0.08 (L) 07/18/2015   TSH 0.20 (L) 05/31/2015   TSH 0.14 (L) 04/28/2015   TSH 0.19 (L) 03/01/2015   TSH 0.005 (L) 01/10/2015   FREET4 2.03 (H) 12/19/2015   FREET4 1.39 10/25/2015   FREET4 1.60 09/01/2015   FREET4 2.80 (H) 07/18/2015   FREET4 2.47 (H) 05/31/2015   FREET4 2.34 (H) 04/28/2015   FREET4 1.75 (H) 03/01/2015   FREET4 2.35 (H) 01/10/2015  11/06/2016: TRAb 9.35 (0-1.75), TSH 20.15 01/10/2015: TSI 588 12/08/14: TSH 0.006, TT4 11.5, T3U 33%,FTI 3.8 TgAB<1 Remainder panel was normal for E2, Progesterone, PRL, testosterone  She c/o CP when draws a deep breath in. This happens randomly, 3x a week.  Pt does have a FH of thyroid ds. - hyper- or hypo-thyroidism in GGM. No FH of thyroid cancer. No h/o radiation tx to head or neck.  No seaweed or kelp. No recent contrast studies. No herbal supplements. No Biotin use. No recent steroids use.   ROS: Constitutional: no weight gain/no weight loss, + fatigue, no subjective hyperthermia, no subjective hypothermia Eyes: no blurry vision, no xerophthalmia ENT: no sore throat, no nodules palpated in throat, no dysphagia, no odynophagia, no hoarseness Cardiovascular: + CP/+ SOB/+ palpitations/no leg swelling Respiratory: no cough/+ SOB/no wheezing  Gastrointestinal: no N/no V/no D/no C/no acid reflux Musculoskeletal: no muscle aches/no joint aches Skin: no rashes, no hair loss Neurological: + tremors >> improved/no numbness/no tingling/no dizziness Menses now regular!  I reviewed pt's medications, allergies, PMH, social hx, family hx, and changes were documented in the history of present illness. Otherwise, unchanged from my initial visit note. She is off Atenolol, MMI.  Past Medical History:  Diagnosis  Date  . Anxiety   . Depression   . History of irregular menstrual cycles   . Irregular menses    Past Surgical History:  Procedure Laterality Date  . TONSILLECTOMY AND ADENOIDECTOMY  2012   History   Social History  . Marital Status: Single    Spouse Name: N/A  . Number of Children: 0   Occupational History  . student   Social History Main Topics  . Smoking status: Never Smoker   . Smokeless tobacco: Never Used  . Alcohol Use: Yes  . Drug Use: No   Current Outpatient Prescriptions on File Prior to Visit  Medication Sig Dispense Refill  . levothyroxine (SYNTHROID, LEVOTHROID) 112 MCG tablet Take 1 tablet (112 mcg total) by mouth daily before breakfast. 90 tablet 1  . Norethindrone-Ethinyl Estradiol-Fe Biphas (LO LOESTRIN FE) 1 MG-10 MCG / 10 MCG tablet Take 1 tablet by mouth daily. 1 Package 6  . nitrofurantoin, macrocrystal-monohydrate, (MACROBID) 100 MG capsule Take 1 capsule (100 mg total) by mouth 2 (two) times daily. (Patient not taking: Reported on 03/17/2017) 14 capsule 1  . terconazole (TERAZOL 7) 0.4 % vaginal cream Place 1 applicator vaginally at bedtime. (Patient not taking: Reported on 03/17/2017) 45 g 0   No current facility-administered medications on file prior to visit.    Allergies  Allergen Reactions  . Sulfa Antibiotics Rash   Family History  Problem Relation Age of Onset  . Thyroid disease Other    PE: BP 116/76   Pulse 68   Ht 5' 8.5" (1.74 m)   Wt 183 lb (83 kg)   SpO2 97%   BMI 27.42 kg/m  Body mass index is 27.42 kg/m. Wt Readings from Last 3 Encounters:  03/17/17 183 lb (83 kg)  01/23/17 181 lb 1.6 oz (82.1 kg)  06/25/16 174 lb (78.9 kg)   Constitutional: overweight, in NAD Eyes: PERRLA, EOMI, no exophthalmos ENT: moist mucous membranes, no neck masses palpated, no cervical lymphadenopathy. Axillary scar healed. Cardiovascular: RRR, No MRG Respiratory: CTA B Gastrointestinal: abdomen soft, NT, ND, BS+ Musculoskeletal: no  deformities, strength intact in all 4 Skin: moist, warm, no rashes Neurological: + mild tremor with outstretched hands, DTR normal in all 4  ASSESSMENT: 1. H/o Graves ds  2. Postop hypothyroidism  3. CP  PLAN:  1. Patient with h/o Graves ds., severe, not responding well to MMI, now s/o [facility] our [facility] - axillary scar healing  2. Postop hypothyroidism - latest thyroid labs reviewed with pt >> TSH was high >> LT4 dose increased - she continues on LT4 112 mcg daily - pt feels good on this dose. - we discussed about taking the thyroid hormone every day, with water, >30 minutes before breakfast, separated by >4 hours from acid reflux medications, calcium, iron, multivitamins. Pt. is taking it correctly - will check thyroid tests today: TSH and fT4 - If labs are abnormal, she will need to return for repeat TFTs in 1.5 months - OTW, RTC in 6 mo  3. CP - new, episodic, assoc  with SOB, but she thinks this is 2/2 pain - no tachycardia - she is on OCPs  - discussed with pt to go to UC or a walking clinic >> will need further investigation   Needs refills.  Office Visit on 03/17/2017  Component Date Value Ref Range Status  . TSH 03/17/2017 3.37  0.35 - 4.50 uIU/mL Final  . Free T4 03/17/2017 1.18  0.60 - 1.60 ng/dL Final   Comment: Specimens from patients who are undergoing biotin therapy and /or ingesting biotin supplements may contain high levels of biotin.  The higher biotin concentration in these specimens interferes with this Free T4 assay.  Specimens that contain high levels  of biotin may cause false high results for this Free T4 assay.  Please interpret results in light of the total clinical presentation of the patient.     TFTs now normal >> continue current LT4 dose.   Carlus Pavlovristina Terecia Plaut, MD PhD [facility]

## 2017-03-17 NOTE — Telephone Encounter (Signed)
Called patient back, and gave Dr.Gherghe the phone. She then spoke with Dr.Gherghe. No other questions at this time. 

## 2017-03-17 NOTE — Telephone Encounter (Signed)
Called patient back, and gave Dr.Gherghe the phone. She then spoke with Dr.Gherghe. No other questions at this time.

## 2017-03-19 DIAGNOSIS — R0789 Other chest pain: Secondary | ICD-10-CM | POA: Diagnosis not present

## 2017-03-19 DIAGNOSIS — E890 Postprocedural hypothyroidism: Secondary | ICD-10-CM | POA: Diagnosis not present

## 2017-03-21 LAB — THYROID STIMULATING IMMUNOGLOBULIN

## 2017-04-04 ENCOUNTER — Telehealth: Payer: Self-pay | Admitting: Internal Medicine

## 2017-04-04 NOTE — Telephone Encounter (Signed)
Called patient and let her know that I do not have information at this time that the Levothyroxine has been recalled.  Please advise. Thanks!

## 2017-04-04 NOTE — Telephone Encounter (Signed)
Patient called in reference to levothyroxine (SYNTHROID, LEVOTHROID) 112 MCG tablet being recalled. Please call patient and advise. OK to leave message.

## 2017-04-04 NOTE — Telephone Encounter (Signed)
Routing to you °

## 2017-04-07 ENCOUNTER — Telehealth: Payer: Self-pay

## 2017-04-07 NOTE — Telephone Encounter (Signed)
Called and notified patient of the information regarding the recall, and that her medication is okay to take. Left call back number if any questions 

## 2017-04-07 NOTE — Telephone Encounter (Signed)
Called and notified patient of the information regarding the recall, and that her medication is okay to take. Left call back number if any questions

## 2017-07-25 ENCOUNTER — Ambulatory Visit (INDEPENDENT_AMBULATORY_CARE_PROVIDER_SITE_OTHER): Payer: 59 | Admitting: Obstetrics and Gynecology

## 2017-07-25 ENCOUNTER — Other Ambulatory Visit: Payer: Self-pay | Admitting: Obstetrics and Gynecology

## 2017-07-25 ENCOUNTER — Encounter: Payer: Self-pay | Admitting: Obstetrics and Gynecology

## 2017-07-25 VITALS — BP 119/80 | HR 73 | Ht 69.0 in | Wt 182.1 lb

## 2017-07-25 DIAGNOSIS — Z01419 Encounter for gynecological examination (general) (routine) without abnormal findings: Secondary | ICD-10-CM | POA: Diagnosis not present

## 2017-07-25 MED ORDER — PHENTERMINE HCL 37.5 MG PO TABS
37.5000 mg | ORAL_TABLET | Freq: Every day | ORAL | 2 refills | Status: DC
Start: 2017-07-25 — End: 2018-12-15

## 2017-07-25 MED ORDER — CYANOCOBALAMIN 1000 MCG/ML IJ SOLN: 1000.0000 ug | INTRAMUSCULAR | 1 refills | Status: DC

## 2017-07-25 MED ORDER — NORETHIN-ETH ESTRAD-FE BIPHAS 1 MG-10 MCG / 10 MCG PO TABS: 1.0000 | ORAL_TABLET | Freq: Every day | ORAL | 6 refills | Status: DC

## 2017-07-25 NOTE — Progress Notes (Signed)
ANNUAL PREVENTATIVE CARE GYN  ENCOUNTER NOTE  Subjective:       Debra [name] is aengaged white  21 y.o. G0P0000 female here for a routine annual gynecologic exam.  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 Current complaints: 1.  Hypothyroidism.    Gynecologic History Patient's last menstrual period was 06/25/2017. Contraception: OCP (estrogen/progesterone) Last Pap: NA. Results were: never had one   Obstetric History OB History  Gravida Para Term Preterm AB Living  0 0 0 0 0 0  SAB TAB Ectopic Multiple Live Births  0 0 0 0          Past Medical History:  Diagnosis Date  . Anxiety   . Depression   . History of irregular menstrual cycles   . Irregular menses     Past Surgical History:  Procedure Laterality Date  . TONSILLECTOMY AND ADENOIDECTOMY  2012  . TOTAL THYROIDECTOMY      Current Outpatient Medications on File Prior to Visit  Medication Sig Dispense Refill  . levothyroxine (SYNTHROID, LEVOTHROID) 112 MCG tablet Take 1 tablet (112 mcg total) by mouth daily before breakfast. 90 tablet 1  . nitrofurantoin, macrocrystal-monohydrate, (MACROBID) 100 MG capsule Take 1 capsule (100 mg total) by mouth 2 (two) times daily. (Patient not taking: Reported on 03/17/2017) 14 capsule 1  . Norethindrone-Ethinyl Estradiol-Fe Biphas (LO LOESTRIN FE) 1 MG-10 MCG / 10 MCG tablet Take 1 tablet by mouth daily. (Patient not taking: Reported on 07/25/2017) 1 Package 6  . terconazole (TERAZOL 7) 0.4 % vaginal cream Place 1 applicator vaginally at bedtime. (Patient not taking: Reported on 03/17/2017) 45 g 0   No current facility-administered medications on file prior to visit.     Allergies  Allergen Reactions  . Sulfa Antibiotics Rash    Social History   Socioeconomic History  . Marital status: Single    Spouse name: Not on file  . Number of children: Not on file  . Years of education: Not on file  . Highest education level: Not on file  Social Needs   . Financial resource strain: Not on file  . Food insecurity - worry: Not on file  . Food insecurity - inability: Not on file  . Transportation needs - medical: Not on file  . Transportation needs - non-medical: Not on file  Occupational History  . Not on file  Tobacco Use  . Smoking status: Never Smoker  . Smokeless tobacco: Never Used  Substance and Sexual Activity  . Alcohol use: Yes  . Drug use: No  . Sexual activity: Not Currently  Other Topics Concern  . Not on file  Social History Narrative  . Not on file    Family History  Problem Relation Age of Onset  . Thyroid disease Other     The following portions of the patient's history were reviewed and updated as appropriate: allergies, current medications, past family history, past medical history, past social history, past surgical history and problem list.  Review of Systems ROS Review of Systems - General ROS: negative for - chills, fatigue, fever, hot flashes, night sweats, weight gain or weight loss Psychological ROS: negative for - anxiety, decreased libido, depression, mood swings, physical abuse or sexual abuse Ophthalmic ROS: negative for - blurry vision, eye pain or loss of vision ENT ROS: negative for - headaches, hearing change, visual changes or vocal changes Allergy and Immunology ROS: negative for - hives, itchy/watery eyes or seasonal allergies Hematological and Lymphatic ROS:  negative for - bleeding problems, bruising, swollen lymph nodes or weight loss Endocrine ROS: negative for - galactorrhea, hair pattern changes, hot flashes, malaise/lethargy, mood swings, palpitations, polydipsia/polyuria, skin changes, temperature intolerance or unexpected weight changes Breast ROS: negative for - new or changing breast lumps or nipple discharge Respiratory ROS: negative for - cough or shortness of breath Cardiovascular ROS: negative for - chest pain, irregular heartbeat, palpitations or shortness of  breath Gastrointestinal ROS: no abdominal pain, change in bowel habits, or black or bloody stools Genito-Urinary ROS: no dysuria, trouble voiding, or hematuria Musculoskeletal ROS: negative for - joint pain or joint stiffness Neurological ROS: negative for - bowel and bladder control changes Dermatological ROS: negative for rash and skin lesion changes   Objective:   BP 119/80   Pulse 73   Ht 5\' 9"  (1.753 m)   Wt 182 lb 1.6 oz (82.6 kg)   LMP 06/25/2017   BMI 26.89 kg/m  CONSTITUTIONAL: Well-developed, well-nourished female in no acute distress.  PSYCHIATRIC: Normal mood and affect. Normal behavior. Normal judgment and thought content. NEUROLGIC: Alert and oriented to person, place, and time. Normal muscle tone coordination. No cranial nerve deficit noted. HENT:  Normocephalic, atraumatic, External right and left ear normal. Oropharynx is clear and moist EYES: Conjunctivae and EOM are normal. No scleral icterus.  NECK: Normal range of motion, supple, no masses.  Normal thyroid.  SKIN: Skin is warm and dry. No rash noted. Not diaphoretic. No erythema. No pallor. CARDIOVASCULAR: Normal heart rate noted, regular rhythm, no murmur. RESPIRATORY: Clear to auscultation bilaterally. Effort and breath sounds normal, no problems with respiration noted. BREASTS: Symmetric in size. No masses, skin changes, nipple drainage, or lymphadenopathy. ABDOMEN: Soft, normal bowel sounds, no distention noted.  No tenderness, rebound or guarding.  BLADDER: Normal PELVIC:  External Genitalia: Normal  BUS: Normal  Vagina: Normal  Cervix: Normal  Uterus: Normal  Adnexa: Normal  RV: External Exam NormaI  MUSCULOSKELETAL: Normal range of motion. No tenderness.  No cyanosis, clubbing, or edema.  2+ distal pulses. LYMPHATIC: No Axillary, Supraclavicular, or Inguinal Adenopathy.    Assessment:   Annual gynecologic examination 21 y.o. Contraception: OCP (estrogen/progesterone) Normal BMI, Obesity 1 and  Obesity 2 Problem List Items Addressed This Visit    None    Visit Diagnoses    Encounter for gynecological examination without abnormal finding    -  Primary      Plan:  Pap: Pap, Reflex if ASCUS Mammogram: NA, Ordered and Not Ordered Stool Guaiac Testing:  NA, Ordered and Not Ordered Labs: pap Routine preventative health maintenance measures emphasized: Exercise/Diet/Weight control, Stress Management and Safe Sex  Return to Clinic - 1 Year   Brigido Mera NIKE Karne Ozga, [place]

## 2017-07-25 NOTE — Patient Instructions (Signed)
Preventive Care 18-39 Years, Female Preventive care refers to lifestyle choices and visits with your health care provider that can promote health and wellness. What does preventive care include?  A yearly physical exam. This is also called an annual well check.  Dental exams once or twice a year.  Routine eye exams. Ask your health care provider how often you should have your eyes checked.  Personal lifestyle choices, including: ? Daily care of your teeth and gums. ? Regular physical activity. ? Eating a healthy diet. ? Avoiding tobacco and drug use. ? Limiting alcohol use. ? Practicing safe sex. ? Taking vitamin and mineral supplements as recommended by your health care provider. What happens during an annual well check? The services and screenings done by your health care provider during your annual well check will depend on your age, overall health, lifestyle risk factors, and family history of disease. Counseling Your health care provider may ask you questions about your:  Alcohol use.  Tobacco use.  Drug use.  Emotional well-being.  Home and relationship well-being.  Sexual activity.  Eating habits.  Work and work Statistician.  Method of birth control.  Menstrual cycle.  Pregnancy history.  Screening You may have the following tests or measurements:  Height, weight, and BMI.  Diabetes screening. This is done by checking your blood sugar (glucose) after you have not eaten for a while (fasting).  Blood pressure.  Lipid and cholesterol levels. These may be checked every 5 years starting at age 38.  Skin check.  Hepatitis C blood test.  Hepatitis B blood test.  Sexually transmitted disease (STD) testing.  BRCA-related cancer screening. This may be done if you have a family history of breast, ovarian, tubal, or peritoneal cancers.  Pelvic exam and Pap test. This may be done every 3 years starting at age 38. Starting at age 30, this may be done  every 5 years if you have a Pap test in combination with an HPV test.  Discuss your test results, treatment options, and if necessary, the need for more tests with your health care provider. Vaccines Your health care provider may recommend certain vaccines, such as:  Influenza vaccine. This is recommended every year.  Tetanus, diphtheria, and acellular pertussis (Tdap, Td) vaccine. You may need a Td booster every 10 years.  Varicella vaccine. You may need this if you have not been vaccinated.  HPV vaccine. If you are 39 or younger, you may need three doses over 6 months.  Measles, mumps, and rubella (MMR) vaccine. You may need at least one dose of MMR. You may also need a second dose.  Pneumococcal 13-valent conjugate (PCV13) vaccine. You may need this if you have certain conditions and were not previously vaccinated.  Pneumococcal polysaccharide (PPSV23) vaccine. You may need one or two doses if you smoke cigarettes or if you have certain conditions.  Meningococcal vaccine. One dose is recommended if you are age 68-21 years and a first-year college student living in a residence hall, or if you have one of several medical conditions. You may also need additional booster doses.  Hepatitis A vaccine. You may need this if you have certain conditions or if you travel or work in places where you may be exposed to hepatitis A.  Hepatitis B vaccine. You may need this if you have certain conditions or if you travel or work in places where you may be exposed to hepatitis B.  Haemophilus influenzae type b (Hib) vaccine. You may need this  if you have certain risk factors.  Talk to your health care provider about which screenings and vaccines you need and how often you need them. This information is not intended to replace advice given to you by your health care provider. Make sure you discuss any questions you have with your health care provider. Document Released: 10/01/2001 Document Revised:  04/24/2016 Document Reviewed: 06/06/2015 Elsevier Interactive Patient Education  2017 Elsevier Inc.  

## 2017-07-29 LAB — CYTOLOGY - PAP

## 2017-08-14 ENCOUNTER — Other Ambulatory Visit: Payer: Self-pay | Admitting: *Deleted

## 2017-08-14 MED ORDER — NORETHIN-ETH ESTRAD-FE BIPHAS 1 MG-10 MCG / 10 MCG PO TABS: 1.0000 | ORAL_TABLET | Freq: Every day | ORAL | 6 refills | Status: DC

## 2017-08-25 ENCOUNTER — Ambulatory Visit: Payer: 59 | Admitting: Obstetrics and Gynecology

## 2017-08-25 ENCOUNTER — Encounter: Payer: Self-pay | Admitting: Obstetrics and Gynecology

## 2017-08-25 ENCOUNTER — Ambulatory Visit (INDEPENDENT_AMBULATORY_CARE_PROVIDER_SITE_OTHER): Payer: 59 | Admitting: Obstetrics and Gynecology

## 2017-08-25 VITALS — BP 118/80 | HR 75 | Wt 175.0 lb

## 2017-08-25 DIAGNOSIS — E663 Overweight: Secondary | ICD-10-CM | POA: Diagnosis not present

## 2017-08-25 MED ORDER — CYANOCOBALAMIN 1000 MCG/ML IJ SOLN
1000.0000 ug | Freq: Once | INTRAMUSCULAR | Status: AC
  Administered 2017-08-25: 1000 ug via INTRAMUSCULAR

## 2017-08-25 NOTE — Progress Notes (Signed)
Pt is here for wt, bp check, b-12 inj, She is doing well, denies any s/e  08/25/17 wt- 175.0lb 07/25/17- wt- 182lb

## 2017-08-25 NOTE — Addendum Note (Signed)
Addended by: Rosine BeatLONTZ, AMY L on: 08/25/2017 02:54 PM   Modules accepted: Level of Service

## 2017-09-16 ENCOUNTER — Telehealth: Payer: Self-pay | Admitting: Internal Medicine

## 2017-09-16 NOTE — Telephone Encounter (Signed)
Patient stated that she is calling regarding an appoint on the 31st

## 2017-09-18 ENCOUNTER — Ambulatory Visit (INDEPENDENT_AMBULATORY_CARE_PROVIDER_SITE_OTHER): Payer: 59 | Admitting: Internal Medicine

## 2017-09-18 ENCOUNTER — Encounter: Payer: Self-pay | Admitting: Internal Medicine

## 2017-09-18 VITALS — BP 120/80 | HR 79 | Ht 69.0 in | Wt 176.8 lb

## 2017-09-18 DIAGNOSIS — R079 Chest pain, unspecified: Secondary | ICD-10-CM

## 2017-09-18 DIAGNOSIS — Z8639 Personal history of other endocrine, nutritional and metabolic disease: Secondary | ICD-10-CM

## 2017-09-18 DIAGNOSIS — M542 Cervicalgia: Secondary | ICD-10-CM

## 2017-09-18 DIAGNOSIS — E890 Postprocedural hypothyroidism: Secondary | ICD-10-CM

## 2017-09-18 LAB — T4, FREE: FREE T4: 1.13 ng/dL (ref 0.60–1.60)

## 2017-09-18 LAB — T3, FREE: T3, Free: 3.4 pg/mL (ref 2.3–4.2)

## 2017-09-18 LAB — TSH: TSH: 1.83 u[IU]/mL (ref 0.35–4.50)

## 2017-09-18 MED ORDER — LEVOTHYROXINE SODIUM 112 MCG PO TABS: 112.0000 ug | ORAL_TABLET | Freq: Every day | ORAL | 3 refills | Status: DC

## 2017-09-18 MED ORDER — LEVOTHYROXINE SODIUM 112 MCG PO TABS: 112.0000 ug | ORAL_TABLET | Freq: Every day | ORAL | 1 refills | Status: DC

## 2017-09-18 NOTE — Patient Instructions (Addendum)
Please stop at the lab.  Please continue Levothyroxine 112 mcg daily.  Take the thyroid hormone every day, with water, at least 30 minutes before breakfast, separated by at least 4 hours from: - acid reflux medications - calcium - iron - multivitamins  Please return in 1 year.  

## 2017-09-18 NOTE — Progress Notes (Signed)
Patient ID: Debra [name], female   DOB: [birth year], 22 y.o.   MRN: [id number]    HPI  NOTNAMED [name] is a 22 y.o.-year-old female, returning for follow up for h/o uncontrolled Graves ds, now with postsurgical hypothyroidism. Last visit 6 months ago.  Reviewed and addended history: She started to have mood swings and anxiety episodes in 2014. She then developed heat intolerance, tremors, palpitations, fatigue.  Thyroid US, [facility] (12/30/14) - normal, except heterogeneity: Right thyroid lobe Measurements: 4.0 x 1.3 x 1.3 cm. No nodules visualized. Mildly heterogeneous tissue.  Left thyroid lobe Measurements: 4.3 x 1.0 x 1.3 cm. No nodules visualized. Mildly heterogeneous tissue.  Isthmus Thickness: 3 mm. No nodules visualized.  Lymphadenopathy: None visualized.  IMPRESSION: No evidence of thyroid nodule. Mildly heterogeneous glandular tissue. Normal size.  Thyroid Uptake and scan (01/13/2015): The thyroid scan demonstrates fairly symmetric and homogeneous uptake in the thyroid gland. No hot or cold nodules are demonstrated.  4 hour I 131 uptake = 51.2% (normal 5-20%)  24 hour I 131 uptake = 55.8% (normal 10-30%)  IMPRESSION: Elevated 4 and 24 hr iodine 123 uptake consistent with hyperthyroidism/Graves disease.  She started MMI 10 mg in am in 12/2014 >> we decreased the dose to 5 mg 2x a day, as her TFTs were improving in 02/2015, however, in 04/2015, her TFTs were still abnormal >> increased MMI to 10 mg 2x a day >>  20-10-20 mg >> 20 mg 3x a day in 12/2015.  Despite this, her TFTs remained abnormal.   She repeatedly refused RAI treatment and conventional thyroidectomy.  I then suggested transaxillary thyroidectomy, which she had at [facility] 07/29/2016 (Dr. Lovie Macadamia) >> now postop hypothyroidism  Pt is on levothyroxine 112 mcg daily, taken: - in am - fasting - at least 30 min from b'fast - no Ca, Fe, MVI, PPIs - not on Biotin  I reviewed patient's thyroid  tests: Lab Results  Component Value Date   TSH 3.37 03/17/2017   TSH 14.980 (H) 12/31/2016   TSH <0.006 (L) 05/30/2016   TSH 0.05 (L) 12/19/2015   TSH 0.12 (L) 10/25/2015   TSH 0.25 (L) 09/01/2015   TSH 0.08 (L) 07/18/2015   TSH 0.20 (L) 05/31/2015   TSH 0.14 (L) 04/28/2015   TSH 0.19 (L) 03/01/2015   FREET4 1.18 03/17/2017   FREET4 2.03 (H) 12/19/2015   FREET4 1.39 10/25/2015   FREET4 1.60 09/01/2015   FREET4 2.80 (H) 07/18/2015   FREET4 2.47 (H) 05/31/2015   FREET4 2.34 (H) 04/28/2015   FREET4 1.75 (H) 03/01/2015   FREET4 2.35 (H) 01/10/2015  11/06/2016: TRAb 9.35 (0-1.75), TSH 20.15 01/10/2015: TSI 588 12/08/14: TSH 0.006, TT4 11.5, T3U 33%,FTI 3.8 TgAB<1 Remainder panel was normal for E2, Progesterone, PRL, testosterone  Pt denies: - hoarseness - dysphagia - choking - SOB with lying down  However, she tells me that she occasionally feels a "lump" on the right side of her neck (the transaxillary intervention study in the right axilla) but this is reducible and only happens occasionally.  Not bothering her when she swallows.  Her chest pain has  resolved since last visit, on anti-inflammatory medication.  Pt does have a FH of thyroid ds. - hyper- or hypo-thyroidism in GGM. No FH of thyroid cancer. No h/o radiation tx to head or neck.  No seaweed or kelp. No recent contrast studies. No herbal supplements. No Biotin use. No recent steroids use.   ROS: Constitutional: no weight gain/no weight loss, no fatigue, no subjective  hyperthermia, no subjective hypothermia Eyes: no blurry vision, no xerophthalmia ENT: no sore throat, + see hPI Cardiovascular: no CP/no SOB/no palpitations/no leg swelling Respiratory: no cough/no SOB/no wheezing Gastrointestinal: no N/no V/no D/no C/no acid reflux Musculoskeletal: no muscle aches/no joint aches Skin: no rashes, no hair loss Neurological: no tremors/no numbness/no tingling/no dizziness  I reviewed pt's medications, allergies,  PMH, social hx, family hx, and changes were documented in the history of present illness. Otherwise, unchanged from my initial visit note.  I reviewed pt's medications, allergies, PMH, social hx, family hx, and changes were documented in the history of present illness. Otherwise, unchanged from my initial visit note.   Past Medical History:  Diagnosis Date  . Anxiety   . Depression   . History of irregular menstrual cycles   . Irregular menses    Past Surgical History:  Procedure Laterality Date  . TONSILLECTOMY AND ADENOIDECTOMY  2012  . TOTAL THYROIDECTOMY     History   Social History  . Marital Status: Single    Spouse Name: N/A  . Number of Children: 0   Occupational History  . student   Social History Main Topics  . Smoking status: Never Smoker   . Smokeless tobacco: Never Used  . Alcohol Use: Yes  . Drug Use: No   Current Outpatient Medications on File Prior to Visit  Medication Sig Dispense Refill  . cyanocobalamin (,VITAMIN B-12,) 1000 MCG/ML injection Inject 1 mL (1,000 mcg total) into the muscle every 30 (thirty) days. 10 mL 1  . levothyroxine (SYNTHROID, LEVOTHROID) 112 MCG tablet Take 1 tablet (112 mcg total) by mouth daily before breakfast. 90 tablet 1  . Norethindrone-Ethinyl Estradiol-Fe Biphas (LO LOESTRIN FE) 1 MG-10 MCG / 10 MCG tablet Take 1 tablet by mouth daily. (Patient not taking: Reported on 09/18/2017) 3 Package 6  . phentermine (ADIPEX-P) 37.5 MG tablet Take 1 tablet (37.5 mg total) by mouth daily before breakfast. (Patient not taking: Reported on 09/18/2017) 30 tablet 2   No current facility-administered medications on file prior to visit.    Allergies  Allergen Reactions  . Sulfa Antibiotics Rash   Family History  Problem Relation Age of Onset  . Thyroid disease Other    PE: BP 120/80   Pulse 79   Ht 5\' 9"  (1.753 m)   Wt 176 lb 12.8 oz (80.2 kg)   LMP 09/04/2017   SpO2 99%   BMI 26.11 kg/m  Body mass index is 26.11 kg/m. Wt Readings  from Last 3 Encounters:  09/18/17 176 lb 12.8 oz (80.2 kg)  08/25/17 175 lb (79.4 kg)  07/25/17 182 lb 1.6 oz (82.6 kg)   Constitutional: overweight, in NAD Eyes: PERRLA, EOMI, no exophthalmos ENT: moist mucous membranes, no neck masses palpated on the right side of her neck or anywhere else, no cervical lymphadenopathy Cardiovascular: RRR, No MRG Respiratory: CTA B Gastrointestinal: abdomen soft, NT, ND, BS+ Musculoskeletal: no deformities, strength intact in all 4 Skin: moist, warm, no rashes Neurological: no tremor with outstretched hands, DTR normal in all 4  ASSESSMENT: 1. H/o Graves ds  2. Postop hypothyroidism  3.  Neck discomfort  4. CP  PLAN:  1. Patient with h/o Graves ds.,  Severe, not responding well to methimazole, now status post transaxillary thyroidectomy by Dr. Lovie Macadamia at [facility]  - no Graves' ophthalmopathy evident  2. Postop hypothyroidism - latest thyroid labs reviewed with pt >> normal  - she continues on LT4 112 mcg daily -she recently changed the tablets  from [employer] to another manufacturer >> she has some nausea after she takes the new pill.  This has improved, however.  We discussed that she may try to get the [employer] levothyroxine from another pharmacy (printed her prescription), or, we can send Synthroid to her pharmacy.  I will wait for the results of her labs to see which dose we need.  If we need to decrease the dose to 100 mcg daily, she can start taking 2 x 50 mcg levothyroxine tablets, which are white, and without excipients. - Otherwise, pt feels good on this dose. - we discussed about taking the thyroid hormone every day, with water, >30 minutes before breakfast, separated by >4 hours from acid reflux medications, calcium, iron, multivitamins. Pt. is taking it correctly - will check thyroid tests today: TSH and fT4 - If labs are abnormal, she will need to return for repeat TFTs in 1.5 months - OTW, RTC in 1 year  3.  Neck discomfort - I do not feel any  masses at palpation of her right side of the neck  - we discussed that this could be some scar tissue left behind after the surgery.  - If her sensation of neck mass persists, we may need to check an ultrasound, but I do not feel we need this for now  4. CP - She was having episodic chest pain at last visit, associated with shortness of breath - We discussed about seeing PCP at that time and she was investigated for this >> costochondritis >> treated with anti-inflammatory medications.  Office Visit on 09/18/2017  Component Date Value Ref Range Status  . TSH 09/18/2017 1.83  0.35 - 4.50 uIU/mL Final  . Free T4 09/18/2017 1.13  0.60 - 1.60 ng/dL Final   Comment: Specimens from patients who are undergoing biotin therapy and /or ingesting biotin supplements may contain high levels of biotin.  The higher biotin concentration in these specimens interferes with this Free T4 assay.  Specimens that contain high levels  of biotin may cause false high results for this Free T4 assay.  Please interpret results in light of the total clinical presentation of the patient.    . T3, Free 09/18/2017 3.4  2.3 - 4.2 pg/mL Final   Msg sent: Dear Lillia AbedLindsay, The thyroid tests are perfect! Please try to refill the prescription that I gave you with levothyroxine from [employer]. If you cannot, I would be happy to send brand name Synthroid to your pharmacy.  Sincerely, Carlus Pavlovristina Massiah Longanecker MD   Carlus Pavlovristina Janene Yousuf, MD PhD [facility]

## 2017-09-22 ENCOUNTER — Ambulatory Visit (INDEPENDENT_AMBULATORY_CARE_PROVIDER_SITE_OTHER): Payer: 59 | Admitting: Obstetrics and Gynecology

## 2017-09-22 ENCOUNTER — Encounter: Payer: Self-pay | Admitting: Obstetrics and Gynecology

## 2017-09-22 VITALS — BP 108/66 | HR 83 | Wt 171.9 lb

## 2017-09-22 DIAGNOSIS — E663 Overweight: Secondary | ICD-10-CM | POA: Diagnosis not present

## 2017-09-22 MED ORDER — CYANOCOBALAMIN 1000 MCG/ML IJ SOLN
1000.0000 ug | Freq: Once | INTRAMUSCULAR | Status: AC
Start: 2017-09-22 — End: 2017-09-22
  Administered 2017-09-22: 1000 ug via INTRAMUSCULAR

## 2017-09-22 NOTE — Progress Notes (Signed)
Pt is here for wt, bp check, b-12 inj She is doing really well, happy with her weight loss  09/22/17 wt-171.9lb 08/25/17 wt- 175lb

## 2017-09-23 ENCOUNTER — Telehealth: Payer: Self-pay | Admitting: Obstetrics and Gynecology

## 2017-09-24 ENCOUNTER — Encounter: Payer: Self-pay | Admitting: Obstetrics and Gynecology

## 2017-10-20 ENCOUNTER — Encounter: Payer: Self-pay | Admitting: Obstetrics and Gynecology

## 2017-10-20 ENCOUNTER — Ambulatory Visit (INDEPENDENT_AMBULATORY_CARE_PROVIDER_SITE_OTHER): Payer: 59 | Admitting: Certified Nurse Midwife

## 2017-10-20 VITALS — BP 112/80 | HR 88 | Wt 172.0 lb

## 2017-10-20 DIAGNOSIS — Z7689 Persons encountering health services in other specified circumstances: Secondary | ICD-10-CM

## 2017-10-20 MED ORDER — CYANOCOBALAMIN 1000 MCG/ML IJ SOLN
1000.0000 ug | Freq: Once | INTRAMUSCULAR | Status: AC
  Administered 2017-10-20: 1000 ug via INTRAMUSCULAR

## 2017-10-20 NOTE — Progress Notes (Signed)
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

## 2017-10-20 NOTE — Progress Notes (Signed)
I have reviewed the record and concur with patient management and plan.    Rozetta Stumpp Michelle Harun Brumley, CNM [facility] 

## 2017-11-06 NOTE — Telephone Encounter (Signed)
Error

## 2017-11-19 ENCOUNTER — Encounter: Payer: Self-pay | Admitting: Obstetrics and Gynecology

## 2017-11-19 ENCOUNTER — Ambulatory Visit (INDEPENDENT_AMBULATORY_CARE_PROVIDER_SITE_OTHER): Payer: 59 | Admitting: Obstetrics and Gynecology

## 2017-11-19 VITALS — BP 120/78 | HR 88 | Ht 69.0 in | Wt 169.8 lb

## 2017-11-19 DIAGNOSIS — N921 Excessive and frequent menstruation with irregular cycle: Secondary | ICD-10-CM | POA: Diagnosis not present

## 2017-11-19 DIAGNOSIS — Z79899 Other long term (current) drug therapy: Secondary | ICD-10-CM

## 2017-11-19 MED ORDER — NORETHIN-ETH ESTRAD-FE BIPHAS 1 MG-10 MCG / 10 MCG PO TABS: 1.0000 | ORAL_TABLET | Freq: Every day | ORAL | 6 refills | Status: DC

## 2017-11-19 NOTE — Progress Notes (Signed)
SUBJECTIVE:  22 y.o. here for follow-up weight loss visit, previously seen 4 weeks ago. Denies any concerns and feels like medication is working well and has lost 13#s in 3 months.  Also started OCPs and is spotting in second week on second pack of pills.   OBJECTIVE:  BP 120/78   Pulse 88   Ht 5\' 9"  (1.753 m)   Wt 169 lb 12.8 oz (77 kg)   LMP 10/31/2017 Comment: pt has been spotting  BMI 25.08 kg/m   Body mass index is 25.08 kg/m. Patient appears well.  ASSESSMENT:  Overweight- responding well to weight loss plan BTB on OCPs PLAN:  To continue with current medications. B12 101600mcg/ml injection given Recommended doubling up on current pills for 3 days to stop spotting and skip placebos in next 2 packs to prevent menses on honeymoon. RTC in 4 weeks as planned  Melody LelandShambley, CNM

## 2017-12-08 ENCOUNTER — Ambulatory Visit (INDEPENDENT_AMBULATORY_CARE_PROVIDER_SITE_OTHER): Payer: 59 | Admitting: Obstetrics and Gynecology

## 2017-12-08 ENCOUNTER — Encounter: Payer: Self-pay | Admitting: Obstetrics and Gynecology

## 2017-12-08 VITALS — BP 96/66 | HR 85 | Ht 69.0 in | Wt 166.2 lb

## 2017-12-08 DIAGNOSIS — E0500 Thyrotoxicosis with diffuse goiter without thyrotoxic crisis or storm: Secondary | ICD-10-CM

## 2017-12-08 DIAGNOSIS — Z20828 Contact with and (suspected) exposure to other viral communicable diseases: Secondary | ICD-10-CM

## 2017-12-08 NOTE — Patient Instructions (Signed)
Cold Sore A cold sore, also called a fever blister, is a skin infection that causes small, fluid-filled sores to form inside of the mouth or on the lips, gums, nose, chin, or cheeks. Cold sores can spread to other parts of the body, such as the eyes or fingers. In some people with other medical conditions, cold sores can spread to multiple other body sites, including the genitals. Cold sores can be spread or passed from person to person (contagious) until the sores crust over completely. What are the causes? Cold sores are caused by the herpes simplex virus (HSV-1). HSV-1 is closely related to the virus that causes genital herpes (HSV-2), but these viruses are not the same. Once a person is infected with HSV-1, the virus remains permanently in the body. HSV-1 is spread from person to person through close contact, such as through kissing, touching the affected area, or sharing personal items such as lip balm, razors, or eating utensils. What increases the risk? A cold sore outbreak is more likely to develop in people who:  Are tired, stressed, or sick.  Are menstruating.  Are pregnant.  Take certain medicines.  Are exposed to cold weather or too much sun.  What are the signs or symptoms? Symptoms of a cold sore outbreak often go through different stages. Here is how a cold sore develops:  Tingling, itching, or burning is felt 1-2 days before the outbreak.  Fluid-filled blisters appear on the lips, inside the mouth, on the nose, or on the cheeks.  The blisters start to ooze clear fluid.  The blisters dry up and a yellow crust appears in its place.  The crust falls off.  Other symptoms include:  Fever.  Sore throat.  Headache.  Muscle aches.  Swollen neck glands.  You also may not have any symptoms. How is this diagnosed? This condition is often diagnosed based on your medical history and a physical exam. Your health care provider may swab your sore and then examine it in  the lab. Rarely, blood tests may be done to check for HSV-1. How is this treated? There is no cure for cold sores or HSV-1. There also is no vaccine for HSV-1. Most cold sores go away on their own without treatment within two weeks. Medicines cannot make the infection go away, but medicines can:  Help relieve some of the pain associated with the sores.  Work to stop the virus from multiplying.  Shorten healing time.  Medicines may be in the form of creams, gels, pills, or a shot. Follow these instructions at home: Medicines  Take or apply over-the-counter and prescription medicines only as told by your health care provider.  Use a cotton-tip swab to apply creams or gels to your sores. Sore Care  Do not touch the sores or pick the scabs.  Wash your hands often. Do not touch your eyes without washing your hands first.  Keep the sores clean and dry.  If directed, apply ice to the sores: ? Put ice in a plastic bag. ? Place a towel between your skin and the bag. ? Leave the ice on for 20 minutes, 2-3 times per day. Lifestyle  Do not kiss, have oral sex, or share personal items until your sores heal.  Eat a soft, bland diet. Avoid eating hot, cold, or salty foods. These can hurt your mouth.  Use a straw if it hurts to drink out of a glass.  Avoid the sun and limit your stress if these things   trigger outbreaks. If sun causes cold sores, apply sunscreen on your lips before being out in the sun. Contact a health care provider if:  You have symptoms for more than two weeks.  You have pus coming from the sores.  You have redness that is spreading.  You have pain or irritation in your eye.  You get sores on your genitals.  Your sores do not heal within two weeks.  You have frequent cold sore outbreaks. Get help right away if:  You have a fever and your symptoms suddenly get worse.  You have a headache and confusion. This information is not intended to replace advice  given to you by your health care provider. Make sure you discuss any questions you have with your health care provider. Document Released: 08/02/2000 Document Revised: 03/29/2016 Document Reviewed: 05/26/2015 Elsevier Interactive Patient Education  2018 Elsevier Inc.  

## 2017-12-08 NOTE — Progress Notes (Signed)
Subjective:     Patient ID: Debra [name], female   DOB: [birth year], 22 y.o.   MRN: [id number]  HPI Has been on weight loss medication.  Fiance has cold sores and concerned about transmission. They already avoid kissing when he has an outbreak.    Review of Systems Negative.    Objective:   Physical Exam A&Ox4 Well groomed female in no distress Blood pressure 96/66, pulse 85, height 5\' 9"  (1.753 m), weight 166 lb 3.2 oz (75.4 kg), last menstrual period 11/19/2017. Body mass index is 24.54 kg/m.   oral exam clear, pelvic declined. Assessment:     Medication management Exposure to hsv    Plan:     Will draw HSV labs and repeat TSH. Counseled at length regarding transmission of HSV type 1 and prevention.  Desires continuing on weight loss medications until after wedding next month.  RTC 1 month or as needed.  Debra [name],CNM

## 2018-02-21 ENCOUNTER — Other Ambulatory Visit: Payer: Self-pay | Admitting: Internal Medicine

## 2018-07-15 ENCOUNTER — Encounter: Payer: Self-pay | Admitting: Emergency Medicine

## 2018-07-15 ENCOUNTER — Other Ambulatory Visit: Payer: Self-pay

## 2018-07-15 ENCOUNTER — Emergency Department
Admission: EM | Admit: 2018-07-15 | Discharge: 2018-07-16 | Disposition: A | Discharge status: Home / Self Care | Payer: 59 | Attending: Emergency Medicine | Admitting: Emergency Medicine

## 2018-07-15 ENCOUNTER — Other Ambulatory Visit: Payer: Self-pay | Admitting: Certified Nurse Midwife

## 2018-07-15 DIAGNOSIS — A09 Infectious gastroenteritis and colitis, unspecified: Secondary | ICD-10-CM | POA: Diagnosis not present

## 2018-07-15 DIAGNOSIS — Z79899 Other long term (current) drug therapy: Secondary | ICD-10-CM | POA: Insufficient documentation

## 2018-07-15 DIAGNOSIS — R112 Nausea with vomiting, unspecified: Secondary | ICD-10-CM | POA: Diagnosis not present

## 2018-07-15 DIAGNOSIS — R197 Diarrhea, unspecified: Secondary | ICD-10-CM

## 2018-07-15 DIAGNOSIS — R1110 Vomiting, unspecified: Secondary | ICD-10-CM | POA: Diagnosis not present

## 2018-07-15 LAB — URINALYSIS, COMPLETE (UACMP) WITH MICROSCOPIC
Bilirubin Urine: NEGATIVE
GLUCOSE, UA: NEGATIVE mg/dL
Hgb urine dipstick: NEGATIVE
Ketones, ur: 5 mg/dL — AB
LEUKOCYTES UA: NEGATIVE
Nitrite: NEGATIVE
PH: 6 (ref 5.0–8.0)
Protein, ur: NEGATIVE mg/dL
SPECIFIC GRAVITY, URINE: 1.02 (ref 1.005–1.030)

## 2018-07-15 LAB — COMPREHENSIVE METABOLIC PANEL
ALT: 16 U/L (ref 0–44)
AST: 20 U/L (ref 15–41)
Albumin: 4.7 g/dL (ref 3.5–5.0)
Alkaline Phosphatase: 46 U/L (ref 38–126)
Anion gap: 10 (ref 5–15)
BUN: 9 mg/dL (ref 6–20)
CHLORIDE: 101 mmol/L (ref 98–111)
CO2: 27 mmol/L (ref 22–32)
CREATININE: 0.66 mg/dL (ref 0.44–1.00)
Calcium: 8.7 mg/dL — ABNORMAL LOW (ref 8.9–10.3)
GFR calc Af Amer: >60 mL/min (ref >60)
GFR calc non Af Amer: >60 mL/min (ref >60)
Glucose, Bld: 98 mg/dL (ref 70–99)
Potassium: 3.2 mmol/L — ABNORMAL LOW (ref 3.5–5.1)
SODIUM: 138 mmol/L (ref 135–145)
TOTAL PROTEIN: 7.6 g/dL (ref 6.5–8.1)
Total Bilirubin: 0.9 mg/dL (ref 0.3–1.2)

## 2018-07-15 LAB — CBC
HCT: 42.3 % (ref 36.0–46.0)
Hemoglobin: 14.9 g/dL (ref 12.0–15.0)
MCH: 30.7 pg (ref 26.0–34.0)
MCHC: 35.2 g/dL (ref 30.0–36.0)
MCV: 87.2 fL (ref 80.0–100.0)
PLATELETS: 366 10*3/uL (ref 150–400)
RBC: 4.85 MIL/uL (ref 3.87–5.11)
RDW: 12.1 % (ref 11.5–15.5)
WBC: 7.6 10*3/uL (ref 4.0–10.5)
nRBC: 0 % (ref 0.0–0.2)

## 2018-07-15 LAB — PREGNANCY, URINE: Preg Test, Ur: NEGATIVE

## 2018-07-15 LAB — LIPASE, BLOOD: LIPASE: 48 U/L (ref 11–51)

## 2018-07-15 MED ORDER — ONDANSETRON HCL 4 MG/2ML IJ SOLN
4.0000 mg | Freq: Once | INTRAMUSCULAR | Status: AC
  Administered 2018-07-16: 4 mg via INTRAVENOUS
  Filled 2018-07-15: qty 2

## 2018-07-15 MED ORDER — SODIUM CHLORIDE 0.9 % IV BOLUS
1000.0000 mL | Freq: Once | INTRAVENOUS | Status: AC
  Administered 2018-07-16: 1000 mL via INTRAVENOUS

## 2018-07-15 NOTE — ED Provider Notes (Signed)
[facility] Emergency Department Provider Note    First MD Initiated Contact with Patient 07/15/18 2305     (approximate)  I have reviewed the triage vital signs and the nursing notes.   HISTORY  Chief Complaint Emesis  HPI Debra [name] is a 22 y.o. female with below list of chronic medical conditions presents to the emergency department with nonbloody emesis and diarrhea x6 days.  Patient admits to multiple episodes of both daily.  Patient states watery diarrhea.  Patient admits to "gurgling" abdominal discomfort.  Patient denies any fever however does admit to subjective fevers.   Past Medical History:  Diagnosis Date  . Anxiety   . Depression   . History of irregular menstrual cycles   . Irregular menses   . Thyroid disease     Patient Active Problem List   Diagnosis Date Noted  . 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 Chest pain 03/17/2017  . H/O Graves' disease 01/10/2015    Past Surgical History:  Procedure Laterality Date  . TONSILLECTOMY AND ADENOIDECTOMY  2012  . TOTAL THYROIDECTOMY      Prior to Admission medications   Medication Sig Start Date End Date Taking? Authorizing Provider  cyanocobalamin (,VITAMIN B-12,) 1000 MCG/ML injection Inject 1 mL (1,000 mcg total) into the muscle every 30 (thirty) days. 07/25/17   Shambley, Melody N, CNM  levothyroxine (SYNTHROID, LEVOTHROID) 112 MCG tablet Take 1 tablet (112 mcg total) by mouth daily before breakfast. 09/18/17   Carlus Pavlov, MD  levothyroxine (SYNTHROID, LEVOTHROID) 112 MCG tablet TAKE 1 TABLET (112 MCG TOTAL) BY MOUTH DAILY BEFORE BREAKFAST. 02/23/18   Carlus Pavlov, MD  Norethindrone-Ethinyl Estradiol-Fe Biphas (LO LOESTRIN FE) 1 MG-10 MCG / 10 MCG tablet Take 1 tablet by mouth daily. 11/19/17   Shambley, Melody N, CNM  phentermine (ADIPEX-P) 37.5 MG tablet Take 1 tablet (37.5 mg total) by mouth daily before breakfast. Patient not taking: Reported on 11/19/2017 07/25/17    Purcell Nails, CNM    Allergies Sulfa antibiotics  Family History  Problem Relation Age of Onset  . Thyroid disease Other     Social History Social History   Tobacco Use  . Smoking status: Never Smoker  . Smokeless tobacco: Never Used  Substance Use Topics  . Alcohol use: Yes  . Drug use: No    Review of Systems Constitutional: No fever/chills Eyes: No visual changes. ENT: No sore throat. Cardiovascular: Denies chest pain. Respiratory: Denies shortness of breath. Gastrointestinal: Positive for nausea vomiting and diarrhea. Genitourinary: Negative for dysuria. Musculoskeletal: Negative for neck pain.  Negative for back pain. Integumentary: Negative for rash. Neurological: Negative for headaches, focal weakness or numbness.   ____________________________________________   PHYSICAL EXAM:  VITAL SIGNS: ED Triage Vitals [07/15/18 2158]  Enc Vitals Group     BP 122/80     Pulse Rate 93     Resp 20     Temp 98 F (36.7 C)     Temp Source Oral     SpO2 99 %     Weight 72.6 kg (160 lb)     Height 1.753 m (5\' 9" )     Head Circumference      Peak Flow      Pain Score 2     Pain Loc      Pain Edu?      Excl. in GC?     Constitutional: Alert and oriented. Well appearing and in no acute distress. Eyes: Conjunctivae are normal. Head: Atraumatic.  Mouth/Throat: Mucous membranes are moist. Oropharynx non-erythematous. Neck: No stridor.  Cardiovascular: Normal rate, regular rhythm. Good peripheral circulation. Grossly normal heart sounds. Respiratory: Normal respiratory effort.  No retractions. Lungs CTAB. Gastrointestinal: Soft and nontender. No distention.  Musculoskeletal: No lower extremity tenderness nor edema. No gross deformities of extremities. Neurologic:  Normal speech and language. No gross focal neurologic deficits are appreciated.  Skin:  Skin is warm, dry and intact. No rash noted. Psychiatric: Mood and affect are normal. Speech and behavior are  normal.  ____________________________________________   LABS (all labs ordered are listed, but only abnormal results are displayed)  Labs Reviewed  GASTROINTESTINAL PANEL BY PCR, STOOL (REPLACES STOOL CULTURE) - Abnormal; Notable for the following components:      Result Value   Adenovirus F40/41 DETECTED (*)    All other components within normal limits  COMPREHENSIVE METABOLIC PANEL - Abnormal; Notable for the following components:   Potassium 3.2 (*)    Calcium 8.7 (*)    All other components within normal limits  URINALYSIS, COMPLETE (UACMP) WITH MICROSCOPIC - Abnormal; Notable for the following components:   Color, Urine YELLOW (*)    APPearance CLEAR (*)    Ketones, ur 5 (*)    Bacteria, UA RARE (*)    All other components within normal limits  C DIFFICILE QUICK SCREEN W PCR REFLEX  CBC  LIPASE, BLOOD  PREGNANCY, URINE  POC URINE PREG, ED      Procedures   ____________________________________________   INITIAL IMPRESSION / ASSESSMENT AND PLAN / ED COURSE  As part of my medical decision making, I reviewed the following data within the electronic MEDICAL RECORD NUMBER   22 year old female presenting with above-stated history and physical exam secondary to diarrhea.  Concern for possible infectious etiology and as such C. difficile and stool PCR was obtained.  C. difficile negative.  Patient stool PCR positive for adenovirus F 40/41.  Patient notified of all clinical findings.  Patient was given 2 L IV normal saline in the emergency department ____________________________________________  FINAL CLINICAL IMPRESSION(S) / ED DIAGNOSES  Final diagnoses:  Diarrhea of infectious origin     MEDICATIONS GIVEN DURING THIS VISIT:  Medications  sodium chloride 0.9 % bolus 1,000 mL (0 mLs Intravenous Stopped 07/16/18 0154)  sodium chloride 0.9 % bolus 1,000 mL (0 mLs Intravenous Stopped 07/16/18 0042)  ondansetron (ZOFRAN) injection 4 mg (4 mg Intravenous Given 07/16/18  0011)     ED Discharge Orders    None       Note:  This document was prepared using Dragon voice recognition software and may include unintentional dictation errors.    Darci CurrentBrown, [place], MD 07/16/18 [phone]

## 2018-07-15 NOTE — ED Triage Notes (Signed)
Pt in with co vomiting after eating since Friday, does have diarrhea. Pt states not able to keep food or fluids down, does have generalized abd pain.

## 2018-07-15 NOTE — Progress Notes (Signed)
Pt complains of diarrhea for the past few days. Stool sample ordered.   Doreene BurkeAnnie Finnley [place], CNM

## 2018-07-16 DIAGNOSIS — A09 Infectious gastroenteritis and colitis, unspecified: Secondary | ICD-10-CM | POA: Diagnosis not present

## 2018-07-16 LAB — GASTROINTESTINAL PANEL BY PCR, STOOL (REPLACES STOOL CULTURE)
ADENOVIRUS F40/41: DETECTED — AB
ASTROVIRUS: NOT DETECTED
CAMPYLOBACTER SPECIES: NOT DETECTED
Cryptosporidium: NOT DETECTED
Cyclospora cayetanensis: NOT DETECTED
ENTEROAGGREGATIVE E COLI (EAEC): NOT DETECTED
ENTEROPATHOGENIC E COLI (EPEC): NOT DETECTED
ENTEROTOXIGENIC E COLI (ETEC): NOT DETECTED
Entamoeba histolytica: NOT DETECTED
GIARDIA LAMBLIA: NOT DETECTED
NOROVIRUS GI/GII: NOT DETECTED
Plesimonas shigelloides: NOT DETECTED
ROTAVIRUS A: NOT DETECTED
Salmonella species: NOT DETECTED
Sapovirus (I, II, IV, and V): NOT DETECTED
Shiga like toxin producing E coli (STEC): NOT DETECTED
Shigella/Enteroinvasive E coli (EIEC): NOT DETECTED
Vibrio cholerae: NOT DETECTED
Vibrio species: NOT DETECTED
YERSINIA ENTEROCOLITICA: NOT DETECTED

## 2018-07-16 LAB — C DIFFICILE QUICK SCREEN W PCR REFLEX
C DIFFICILE (CDIFF) INTERP: NOT DETECTED
C DIFFICILE (CDIFF) TOXIN: NEGATIVE
C DIFFICLE (CDIFF) ANTIGEN: NEGATIVE

## 2018-07-16 NOTE — ED Notes (Signed)
Pt is AOx4, VSS, she denies any pain at this time and her only complaint is diarrhea and gurrgling. Pt denies N/V at this time. A hat has been placed in the room for stool sample collection.

## 2018-07-30 ENCOUNTER — Encounter: Payer: Self-pay | Admitting: Obstetrics and Gynecology

## 2018-07-30 ENCOUNTER — Ambulatory Visit (INDEPENDENT_AMBULATORY_CARE_PROVIDER_SITE_OTHER): Payer: 59 | Admitting: Obstetrics and Gynecology

## 2018-07-30 VITALS — BP 92/64 | HR 72 | Ht 69.0 in | Wt 175.2 lb

## 2018-07-30 DIAGNOSIS — Z23 Encounter for immunization: Secondary | ICD-10-CM

## 2018-07-30 DIAGNOSIS — Z01419 Encounter for gynecological examination (general) (routine) without abnormal findings: Secondary | ICD-10-CM

## 2018-07-30 NOTE — Patient Instructions (Signed)
Preventive Care 18-39 Years, Female Preventive care refers to lifestyle choices and visits with your health care provider that can promote health and wellness. What does preventive care include?  A yearly physical exam. This is also called an annual well check.  Dental exams once or twice a year.  Routine eye exams. Ask your health care provider how often you should have your eyes checked.  Personal lifestyle choices, including: ? Daily care of your teeth and gums. ? Regular physical activity. ? Eating a healthy diet. ? Avoiding tobacco and drug use. ? Limiting alcohol use. ? Practicing safe sex. ? Taking vitamin and mineral supplements as recommended by your health care provider. What happens during an annual well check? The services and screenings done by your health care provider during your annual well check will depend on your age, overall health, lifestyle risk factors, and family history of disease. Counseling Your health care provider may ask you questions about your:  Alcohol use.  Tobacco use.  Drug use.  Emotional well-being.  Home and relationship well-being.  Sexual activity.  Eating habits.  Work and work Statistician.  Method of birth control.  Menstrual cycle.  Pregnancy history.  Screening You may have the following tests or measurements:  Height, weight, and BMI.  Diabetes screening. This is done by checking your blood sugar (glucose) after you have not eaten for a while (fasting).  Blood pressure.  Lipid and cholesterol levels. These may be checked every 5 years starting at age 38.  Skin check.  Hepatitis C blood test.  Hepatitis B blood test.  Sexually transmitted disease (STD) testing.  BRCA-related cancer screening. This may be done if you have a family history of breast, ovarian, tubal, or peritoneal cancers.  Pelvic exam and Pap test. This may be done every 3 years starting at age 38. Starting at age 30, this may be done  every 5 years if you have a Pap test in combination with an HPV test.  Discuss your test results, treatment options, and if necessary, the need for more tests with your health care provider. Vaccines Your health care provider may recommend certain vaccines, such as:  Influenza vaccine. This is recommended every year.  Tetanus, diphtheria, and acellular pertussis (Tdap, Td) vaccine. You may need a Td booster every 10 years.  Varicella vaccine. You may need this if you have not been vaccinated.  HPV vaccine. If you are 39 or younger, you may need three doses over 6 months.  Measles, mumps, and rubella (MMR) vaccine. You may need at least one dose of MMR. You may also need a second dose.  Pneumococcal 13-valent conjugate (PCV13) vaccine. You may need this if you have certain conditions and were not previously vaccinated.  Pneumococcal polysaccharide (PPSV23) vaccine. You may need one or two doses if you smoke cigarettes or if you have certain conditions.  Meningococcal vaccine. One dose is recommended if you are age 68-21 years and a first-year college student living in a residence hall, or if you have one of several medical conditions. You may also need additional booster doses.  Hepatitis A vaccine. You may need this if you have certain conditions or if you travel or work in places where you may be exposed to hepatitis A.  Hepatitis B vaccine. You may need this if you have certain conditions or if you travel or work in places where you may be exposed to hepatitis B.  Haemophilus influenzae type b (Hib) vaccine. You may need this  if you have certain risk factors.  Talk to your health care provider about which screenings and vaccines you need and how often you need them. This information is not intended to replace advice given to you by your health care provider. Make sure you discuss any questions you have with your health care provider. Document Released: 10/01/2001 Document Revised:  04/24/2016 Document Reviewed: 06/06/2015 Elsevier Interactive Patient Education  2018 Elsevier Inc.  

## 2018-07-30 NOTE — Progress Notes (Signed)
  Subjective:     Debra [name] is a married white 22 y.o. female and is here for a comprehensive physical exam. 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 The patient reports no problems.  Social History   Socioeconomic History  . Marital status: Single    Spouse name: Not on file  . Number of children: Not on file  . Years of education: Not on file  . Highest education level: Not on file  Occupational History  . Not on file  Social Needs  . Financial resource strain: Not on file  . Food insecurity:    Worry: Not on file    Inability: Not on file  . Transportation needs:    Medical: Not on file    Non-medical: Not on file  Tobacco Use  . Smoking status: Never Smoker  . Smokeless tobacco: Never Used  Substance and Sexual Activity  . Alcohol use: Yes  . Drug use: No  . Sexual activity: Yes    Birth control/protection: Pill  Lifestyle  . Physical activity:    Days per week: Not on file    Minutes per session: Not on file  . Stress: Not on file  Relationships  . Social connections:    Talks on phone: Not on file    Gets together: Not on file    Attends religious service: Not on file    Active member of club or organization: Not on file    Attends meetings of clubs or organizations: Not on file    Relationship status: Not on file  . Intimate partner violence:    Fear of current or ex partner: Not on file    Emotionally abused: Not on file    Physically abused: Not on file    Forced sexual activity: Not on file  Other Topics Concern  . Not on file  Social History Narrative  . Not on file   Health Maintenance  Topic Date Due  . HIV Screening  05/01/2010  . TETANUS/TDAP  05/01/2014  . PAP-Cervical Cytology Screening  05/01/2016  . INFLUENZA VACCINE  11/17/2018 (Originally 03/19/2018)  . PAP SMEAR-Modifier  07/25/2020    The following portions of the patient's history were reviewed and updated as appropriate: allergies, current medications, past  family history, past medical history, past social history, past surgical history and problem list.  Review of Systems Pertinent items noted in HPI and remainder of comprehensive ROS otherwise negative.   Objective:    General appearance: alert, cooperative and appears stated age Neck: no adenopathy, no carotid bruit, no JVD, supple, symmetrical, trachea midline and thyroid not enlarged, symmetric, no tenderness/mass/nodules Lungs: clear to auscultation bilaterally Breasts: declined Heart: regular rate and rhythm, S1, S2 normal, no murmur, click, rub or gallop Abdomen: soft, non-tender; bowel sounds normal; no masses,  no organomegaly Pelvic: cervix normal in appearance, external genitalia normal, no adnexal masses or tenderness, no cervical motion tenderness, rectovaginal septum normal, uterus normal size, shape, and consistency and vagina normal without discharge    Assessment:    Healthy female exam.       Needs flu vaccine     Plan:  Flu vaccine given RTC 1 year or as needed.   Melody Shambley,CNM   See After Visit Summary for Counseling Recommendations

## 2018-08-29 ENCOUNTER — Other Ambulatory Visit: Payer: Self-pay | Admitting: Internal Medicine

## 2018-09-14 ENCOUNTER — Ambulatory Visit: Payer: 59 | Admitting: Internal Medicine

## 2018-11-02 ENCOUNTER — Other Ambulatory Visit: Payer: Self-pay | Admitting: Certified Nurse Midwife

## 2018-11-02 ENCOUNTER — Other Ambulatory Visit: Payer: Self-pay | Admitting: *Deleted

## 2018-11-02 MED ORDER — NORETHIN-ETH ESTRAD-FE BIPHAS 1 MG-10 MCG / 10 MCG PO TABS: 1.0000 | ORAL_TABLET | Freq: Every day | ORAL | 6 refills | Status: DC

## 2018-12-14 ENCOUNTER — Telehealth: Payer: Self-pay | Admitting: Obstetrics and Gynecology

## 2018-12-14 NOTE — Telephone Encounter (Signed)
Patient called requesting a generic brand of birth control per her insurance company. Thanks

## 2018-12-15 ENCOUNTER — Ambulatory Visit: Payer: BLUE CROSS/BLUE SHIELD | Admitting: Internal Medicine

## 2018-12-15 ENCOUNTER — Encounter

## 2018-12-15 ENCOUNTER — Other Ambulatory Visit: Payer: Self-pay

## 2018-12-15 ENCOUNTER — Encounter: Payer: Self-pay | Admitting: Internal Medicine

## 2018-12-15 VITALS — BP 104/68 | HR 71 | Temp 97.3°F | Ht 69.0 in | Wt 175.4 lb

## 2018-12-15 DIAGNOSIS — E890 Postprocedural hypothyroidism: Secondary | ICD-10-CM | POA: Diagnosis not present

## 2018-12-15 DIAGNOSIS — M542 Cervicalgia: Secondary | ICD-10-CM

## 2018-12-15 DIAGNOSIS — Z8639 Personal history of other endocrine, nutritional and metabolic disease: Secondary | ICD-10-CM

## 2018-12-15 LAB — T4, FREE: Free T4: 1.5 ng/dL (ref 0.8–1.8)

## 2018-12-15 LAB — TSH: TSH: 0.52 mIU/L

## 2018-12-15 NOTE — Progress Notes (Signed)
Patient ID: Debra [name], female   DOB: [birth year], 23 y.o.   MRN: [id number]    HPI  Debra [name] is a 23 y.o.-year-old female, returning for follow up for h/o uncontrolled Graves ds, now with postsurgical hypothyroidism. Last visit 1 year and 3 months ago.  She got married last year.  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  Reviewed and addended history: She started to have mood swings and anxiety episodes in 2014.  She then developed heat intolerance, tremors, palpitations, fatigue.  Thyroid US, [facility] (12/30/14) - normal, except heterogeneity: Right thyroid lobe Measurements: 4.0 x 1.3 x 1.3 cm. No nodules visualized. Mildly heterogeneous tissue.  Left thyroid lobe Measurements: 4.3 x 1.0 x 1.3 cm. No nodules visualized. Mildly heterogeneous tissue.  Isthmus Thickness: 3 mm. No nodules visualized.  Lymphadenopathy: None visualized.  IMPRESSION: No evidence of thyroid nodule. Mildly heterogeneous glandular tissue. Normal size.  Thyroid Uptake and scan (01/13/2015): The thyroid scan demonstrates fairly symmetric and homogeneous uptake in the thyroid gland. No hot or cold nodules are demonstrated.  4 hour I 131 uptake = 51.2% (normal 5-20%)  24 hour I 131 uptake = 55.8% (normal 10-30%)  IMPRESSION: Elevated 4 and 24 hr iodine 123 uptake consistent with hyperthyroidism/Graves disease.  She started MMI 10 mg in am in 12/2014 >> we decreased the dose to 5 mg 2x a day, as her TFTs were improving in 02/2015, however, in 04/2015, her TFTs were still abnormal >> increased MMI to 10 mg 2x a day >>  20-10-20 mg >> 20 mg 3x a day in 12/2015.  Despite this, her TFTs remained abnormal.   She repeatedly refused RAI treatment and conventional thyroidectomy.  I then suggested transaxillary thyroidectomy, which she had at [facility] 07/29/2016 (Dr. Lovie Macadamia) >> now postop hypothyroidism.  Pt is on levothyroxine  112 mcg daily, taken: - in am - fasting - at least 30  min from b'fast - no Ca, Fe, MVI, PPIs - not on Biotin  Reviewed patient's TFTs: Lab Results  Component Value Date   TSH 1.83 09/18/2017   TSH 3.37 03/17/2017   TSH 14.980 (H) 12/31/2016   TSH <0.006 (L) 05/30/2016   TSH 0.05 (L) 12/19/2015   TSH 0.12 (L) 10/25/2015   TSH 0.25 (L) 09/01/2015   TSH 0.08 (L) 07/18/2015   TSH 0.20 (L) 05/31/2015   TSH 0.14 (L) 04/28/2015   FREET4 1.13 09/18/2017   FREET4 1.18 03/17/2017   FREET4 2.03 (H) 12/19/2015   FREET4 1.39 10/25/2015   FREET4 1.60 09/01/2015   FREET4 2.80 (H) 07/18/2015   FREET4 2.47 (H) 05/31/2015   FREET4 2.34 (H) 04/28/2015   FREET4 1.75 (H) 03/01/2015   FREET4 2.35 (H) 01/10/2015  11/06/2016: TRAb 9.35 (0-1.75), TSH 20.15 01/10/2015: TSI 588 12/08/14: TSH 0.006, TT4 11.5, T3U 33%,FTI 3.8 TgAB<1 Remainder panel was normal for E2, Progesterone, PRL, testosterone  Pt denies: - feeling nodules in neck - hoarseness - dysphagia - choking - SOB with lying down At last visit she was occasionally feeling a lump on the right side of her neck, but this resolved since then.  Pt does have a FH of thyroid ds. - hyper- or hypo-thyroidism in GGM. No FH of thyroid cancer. No h/o radiation tx to head or neck.  No herbal supplements. No Biotin use. No recent steroids use.   ROS: Constitutional: no weight gain/no weight loss, no fatigue, no subjective hyperthermia, no subjective hypothermia Eyes: no blurry vision, no xerophthalmia ENT: no sore  throat, + see HPI Cardiovascular: no CP/no SOB/no palpitations/no leg swelling Respiratory: no cough/no SOB/no wheezing Gastrointestinal: no N/no V/no D/no C/no acid reflux Musculoskeletal: no muscle aches/no joint aches Skin: no rashes, no hair loss Neurological: + tremors/no numbness/no tingling/no dizziness  I reviewed pt's medications, allergies, PMH, social hx, family hx, and changes were documented in the history of present illness. Otherwise, unchanged from my initial visit  note.  Past Medical History:  Diagnosis Date  . Anxiety   . Depression   . History of irregular menstrual cycles   . Irregular menses   . Thyroid disease    Past Surgical History:  Procedure Laterality Date  . TONSILLECTOMY AND ADENOIDECTOMY  2012  . TOTAL THYROIDECTOMY     History   Social History  . Marital Status: Single    Spouse Name: N/A  . Number of Children: 0   Occupational History  . student   Social History Main Topics  . Smoking status: Never Smoker   . Smokeless tobacco: Never Used  . Alcohol Use: Yes  . Drug Use: No   Current Outpatient Medications on File Prior to Visit  Medication Sig Dispense Refill  . cyanocobalamin (,VITAMIN B-12,) 1000 MCG/ML injection Inject 1 mL (1,000 mcg total) into the muscle every 30 (thirty) days. (Patient not taking: Reported on 07/30/2018) 10 mL 1  . levothyroxine (SYNTHROID, LEVOTHROID) 112 MCG tablet Take 1 tablet (112 mcg total) by mouth daily before breakfast. 90 tablet 3  . levothyroxine (SYNTHROID, LEVOTHROID) 112 MCG tablet TAKE 1 TABLET (112 MCG TOTAL) BY MOUTH DAILY BEFORE BREAKFAST 90 tablet 1  . Norethindrone-Ethinyl Estradiol-Fe Biphas (LO LOESTRIN FE) 1 MG-10 MCG / 10 MCG tablet Take 1 tablet by mouth daily. 3 Package 6  . phentermine (ADIPEX-P) 37.5 MG tablet Take 1 tablet (37.5 mg total) by mouth daily before breakfast. (Patient not taking: Reported on 11/19/2017) 30 tablet 2   No current facility-administered medications on file prior to visit.    Allergies  Allergen Reactions  . Sulfa Antibiotics Rash   Family History  Problem Relation Age of Onset  . Thyroid disease Other   . Hypertension Maternal Grandfather    PE: There were no vitals taken for this visit. There is no height or weight on file to calculate BMI. Wt Readings from Last 3 Encounters:  07/30/18 175 lb 3.2 oz (79.5 kg)  07/15/18 160 lb (72.6 kg)  12/08/17 166 lb 3.2 oz (75.4 kg)   Constitutional: overweight, in NAD Eyes: PERRLA, EOMI,  no exophthalmos ENT: moist mucous membranes, no thyromegaly, no cervical lymphadenopathy Cardiovascular: RRR, No MRG Respiratory: CTA B Gastrointestinal: abdomen soft, NT, ND, BS+ Musculoskeletal: no deformities, strength intact in all 4 Skin: moist, warm, no rashes Neurological: + tremor with outstretched hands, DTR normal in all 4  ASSESSMENT: 1. H/o Graves ds  2. Postop hypothyroidism  3.  Neck discomfort  PLAN:  1. Patient with history of Graves' disease, severe, not responding well to methimazole, now status post [facility] -No evidence of Graves' ophthalmopathy: no double vision, blurry vision, chemosis  2. Postop hypothyroidism - latest thyroid labs reviewed with pt >> normal at last visit - she continues on LT4 112 Mcg daily - pt feels good on this dose.  She initially had nausea with the levothyroxine tablet after she changed the tablets from [employer] to another manufacturer - now resolved. She has tremors at today's visit >> off and on - we discussed about taking the  thyroid hormone every day, with water, >30 minutes before breakfast, separated by >4 hours from acid reflux medications, calcium, iron, multivitamins. Pt. is taking it correctly. - will check thyroid tests today: TSH and fT4 - If labs are abnormal, she will need to return for repeat TFTs in 1.5 months - OTW, I will see her back in a year.  However, I advised him to let me know if she gets pregnant, in which case, we need to check her TFTs sooner and most likely will need to change the levothyroxine dose.  3.  Neck discomfort -At last visit, she was having neck discomfort possibly due to scar tissue, but no masses were felt on palpation -This resolved at this visit -if this appears again >> will check a neck U/S  *Her labs need to go to [facility] Visit on 12/15/2018  Component Date Value Ref Range Status  . Free T4 12/15/2018 1.5  0.8 - 1.8 ng/dL Final  . TSH 14/38/8875  0.52  mIU/L Final   Comment:           Reference Range .           > or = 20 Years  0.40-4.50 .                Pregnancy Ranges           First trimester    0.26-2.66           Second trimester   0.55-2.73           Third trimester    0.43-2.91    Thyroid tests are normal.  Carlus Pavlov, MD PhD [facility] Endocr[facility]

## 2018-12-15 NOTE — Patient Instructions (Signed)
Please stop at the lab.  Please continue Levothyroxine 112 mcg daily.  Take the thyroid hormone every day, with water, at least 30 minutes before breakfast, separated by at least 4 hours from: - acid reflux medications - calcium - iron - multivitamins  Please return in 1 year.  

## 2018-12-25 ENCOUNTER — Other Ambulatory Visit: Payer: Self-pay | Admitting: Certified Nurse Midwife

## 2018-12-25 DIAGNOSIS — Z760 Encounter for issue of repeat prescription: Secondary | ICD-10-CM

## 2018-12-25 MED ORDER — DROSPIRENONE-ETHINYL ESTRADIOL 3-0.02 MG PO TABS: 1.0000 | ORAL_TABLET | Freq: Every day | ORAL | 4 refills | Status: DC

## 2018-12-30 ENCOUNTER — Other Ambulatory Visit: Payer: Self-pay | Admitting: Obstetrics and Gynecology

## 2018-12-30 ENCOUNTER — Encounter: Payer: Self-pay | Admitting: *Deleted

## 2019-03-02 ENCOUNTER — Other Ambulatory Visit: Payer: Self-pay | Admitting: Internal Medicine

## 2019-03-04 ENCOUNTER — Encounter: Payer: Self-pay | Admitting: Internal Medicine

## 2019-03-05 ENCOUNTER — Telehealth: Payer: Self-pay | Admitting: Internal Medicine

## 2019-03-05 NOTE — Telephone Encounter (Signed)
I talked to her >> she is cleared to return to work from the thyroid point of view.

## 2019-03-05 NOTE — Telephone Encounter (Signed)
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

## 2019-08-04 ENCOUNTER — Other Ambulatory Visit: Payer: Self-pay | Admitting: Internal Medicine

## 2019-08-04 ENCOUNTER — Ambulatory Visit: Payer: BC Managed Care – PPO | Attending: Internal Medicine

## 2019-08-04 DIAGNOSIS — Z20822 Contact with and (suspected) exposure to covid-19: Secondary | ICD-10-CM

## 2019-08-06 LAB — NOVEL CORONAVIRUS, NAA: SARS-CoV-2, NAA: NOT DETECTED

## 2019-08-10 ENCOUNTER — Encounter: Payer: 59 | Admitting: Obstetrics and Gynecology

## 2019-08-19 ENCOUNTER — Other Ambulatory Visit: Payer: BC Managed Care – PPO

## 2019-08-19 ENCOUNTER — Ambulatory Visit: Payer: BC Managed Care – PPO | Attending: Internal Medicine

## 2019-08-19 DIAGNOSIS — Z20822 Contact with and (suspected) exposure to covid-19: Secondary | ICD-10-CM

## 2019-08-25 LAB — NOVEL CORONAVIRUS, NAA

## 2019-09-01 ENCOUNTER — Other Ambulatory Visit: Payer: Self-pay | Admitting: Internal Medicine

## 2019-09-21 ENCOUNTER — Encounter: Payer: Self-pay | Admitting: Internal Medicine

## 2019-09-21 ENCOUNTER — Other Ambulatory Visit: Payer: Self-pay | Admitting: Internal Medicine

## 2019-09-21 DIAGNOSIS — Z8639 Personal history of other endocrine, nutritional and metabolic disease: Secondary | ICD-10-CM

## 2019-09-21 DIAGNOSIS — E890 Postprocedural hypothyroidism: Secondary | ICD-10-CM

## 2019-12-14 ENCOUNTER — Ambulatory Visit (INDEPENDENT_AMBULATORY_CARE_PROVIDER_SITE_OTHER): Payer: BC Managed Care – PPO | Admitting: Internal Medicine

## 2019-12-14 ENCOUNTER — Other Ambulatory Visit: Payer: Self-pay

## 2019-12-14 ENCOUNTER — Encounter: Payer: Self-pay | Admitting: Internal Medicine

## 2019-12-14 VITALS — BP 112/70 | HR 82 | Ht 69.0 in | Wt 177.0 lb

## 2019-12-14 DIAGNOSIS — F419 Anxiety disorder, unspecified: Secondary | ICD-10-CM | POA: Diagnosis not present

## 2019-12-14 DIAGNOSIS — Z8639 Personal history of other endocrine, nutritional and metabolic disease: Secondary | ICD-10-CM

## 2019-12-14 DIAGNOSIS — E890 Postprocedural hypothyroidism: Secondary | ICD-10-CM

## 2019-12-14 NOTE — Patient Instructions (Addendum)
Please stop at the lab.  Please continue Levothyroxine 112 mcg daily.  Take the thyroid hormone every day, with water, at least 30 minutes before breakfast, separated by at least 4 hours from: - acid reflux medications - calcium - iron - multivitamins  Please return in 1 year.  

## 2019-12-14 NOTE — Progress Notes (Signed)
Patient ID: Debra [name], female   DOB: [birth year], 24 y.o.   MRN: [id number]   This visit occurred during the SARS-CoV-2 public health emergency.  Safety protocols were in place, including screening questions prior to the visit, additional usage of staff PPE, and extensive cleaning of exam room while observing appropriate contact time as indicated for disinfecting solutions.   HPI  Debra [name] is a 24 y.o.-year-old female, returning for follow up for h/o uncontrolled Graves ds, now with postsurgical hypothyroidism. Last visit 1 year ago.  She contacted me in 09/2019 about anxiety.  I ordered TFTs but she did not present for labs. At this visit, she again complains of increased anxiety and she also has: - chest pains - SOB - tremors - slow digestion - nausea - some constipation  Reviewed and addended history: She started to have mood swings and anxiety episodes in 2014.  She then developed heat intolerance, tremors, palpitations, fatigue.  Thyroid US, [facility] (12/30/14) - normal, except heterogeneity: Right thyroid lobe Measurements: 4.0 x 1.3 x 1.3 cm. No nodules visualized. Mildly heterogeneous tissue.  Left thyroid lobe Measurements: 4.3 x 1.0 x 1.3 cm. No nodules visualized. Mildly heterogeneous tissue.  Isthmus Thickness: 3 mm. No nodules visualized.  Lymphadenopathy: None visualized.  IMPRESSION: No evidence of thyroid nodule. Mildly heterogeneous glandular tissue. Normal size.  Thyroid Uptake and scan (01/13/2015): The thyroid scan demonstrates fairly symmetric and homogeneous uptake in the thyroid gland. No hot or cold nodules are demonstrated.  4 hour I 131 uptake = 51.2% (normal 5-20%)  24 hour I 131 uptake = 55.8% (normal 10-30%)  IMPRESSION: Elevated 4 and 24 hr iodine 123 uptake consistent with hyperthyroidism/Graves disease.  She started MMI 10 mg in am in 12/2014 >> we decreased the dose to 5 mg 2x a day, as her TFTs were improving in  02/2015, however, in 04/2015, her TFTs were still abnormal >> increased MMI to 10 mg 2x a day >>  20-10-20 mg >> 20 mg 3x a day in 12/2015.  Despite this, her TFTs remained abnormal.   She repeatedly refused RAI treatment and conventional thyroidectomy.  I then suggested transaxillary thyroidectomy, which she had at [facility] 07/29/2016 (Dr. Lovie Macadamia) >> she developed postop hypothyroidism.  Pt is on levothyroxine 112 mcg daily, taken: - in am, with her OCPs - fasting - no b'fast - no Ca, Fe, MVI, PPIs - not on Biotin  Reviewed patient's TFTs normal: 07/22/2019: TSH 0.712 Lab Results  Component Value Date   TSH 0.52 12/15/2018   TSH 1.83 09/18/2017   TSH 3.37 03/17/2017   TSH 14.980 (H) 12/31/2016   TSH <0.006 (L) 05/30/2016   TSH 0.05 (L) 12/19/2015   TSH 0.12 (L) 10/25/2015   TSH 0.25 (L) 09/01/2015   TSH 0.08 (L) 07/18/2015   TSH 0.20 (L) 05/31/2015   FREET4 1.5 12/15/2018   FREET4 1.13 09/18/2017   FREET4 1.18 03/17/2017   FREET4 2.03 (H) 12/19/2015   FREET4 1.39 10/25/2015   FREET4 1.60 09/01/2015   FREET4 2.80 (H) 07/18/2015   FREET4 2.47 (H) 05/31/2015   FREET4 2.34 (H) 04/28/2015   FREET4 1.75 (H) 03/01/2015  11/06/2016: TRAb 9.35 (0-1.75), TSH 20.15 01/10/2015: TSI 588 12/08/14: TSH 0.006, TT4 11.5, T3U 33%,FTI 3.8 TgAB<1 Remainder panel was normal for E2, Progesterone, PRL, testosterone  Pt denies: - feeling nodules in neck - hoarseness - dysphagia - choking - SOB with lying down  Pt does have a FH of thyroid ds. - hyper- or hypo-thyroidism  in [place]. No FH of thyroid cancer. No h/o radiation tx to head or neck.  No herbal supplements. No Biotin use. No recent steroids use.   She got married in 2019.  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  ROS: Constitutional: no weight gain/no weight loss, no fatigue, no subjective hyperthermia, no subjective hypothermia Eyes: no blurry vision, no xerophthalmia ENT: no sore throat, + see HPI Cardiovascular: + CP/+  SOB/+ palpitations/no leg swelling Respiratory: no cough/no SOB/no wheezing Gastrointestinal: no N/no V/no D/no C/no acid reflux Musculoskeletal: no muscle aches/no joint aches Skin: no rashes, no hair loss Neurological: + tremors/no numbness/no tingling/no dizziness, + HA  I reviewed pt's medications, allergies, PMH, social hx, family hx, and changes were documented in the history of present illness. Otherwise, unchanged from my initial visit note.  Past Medical History:  Diagnosis Date  . Anxiety   . Depression   . History of irregular menstrual cycles   . Irregular menses   . Thyroid disease    Past Surgical History:  Procedure Laterality Date  . TONSILLECTOMY AND ADENOIDECTOMY  2012  . TOTAL THYROIDECTOMY     History   Social History  . Marital Status: Single    Spouse Name: N/A  . Number of Children: 0   Occupational History  . student   Social History Main Topics  . Smoking status: Never Smoker   . Smokeless tobacco: Never Used  . Alcohol Use: Yes  . Drug Use: No   Current Outpatient Medications on File Prior to Visit  Medication Sig Dispense Refill  . drospirenone-ethinyl estradiol (YAZ) 3-0.02 MG tablet Take 1 tablet by mouth daily. 3 Package 4  . levothyroxine (SYNTHROID) 112 MCG tablet TAKE 1 TABLET (112 MCG TOTAL) BY MOUTH DAILY BEFORE BREAKFAST 90 tablet 1   No current facility-administered medications on file prior to visit.   Allergies  Allergen Reactions  . Sulfa Antibiotics Rash   Family History  Problem Relation Age of Onset  . Thyroid disease Other   . Hypertension Maternal Grandfather    PE: BP 112/70 (BP Location: Left Arm, Patient Position: Sitting, Cuff Size: Normal)   Pulse 82   Ht 5\' 9"  (1.753 m)   Wt 177 lb (80.3 kg)   SpO2 97%   BMI 26.14 kg/m  Body mass index is 26.14 kg/m. Wt Readings from Last 3 Encounters:  12/14/19 177 lb (80.3 kg)  12/15/18 175 lb 6.1 oz (79.6 kg)  07/30/18 175 lb 3.2 oz (79.5 kg)   Constitutional:  overweight, in NAD Eyes: PERRLA, EOMI, no exophthalmos ENT: moist mucous membranes, no thyromegaly, no cervical lymphadenopathy Cardiovascular: RRR, No MRG Respiratory: CTA B Gastrointestinal: abdomen soft, NT, ND, BS+ Musculoskeletal: no deformities, strength intact in all 4 Skin: moist, warm, no rashes Neurological: + tremor with outstretched hands, DTR normal in all 4  ASSESSMENT: 1. H/o Graves ds  2. Postop hypothyroidism  3.  Anxiety  PLAN:  1. Patient with history of severe Graves' disease, not responding well to methimazole, now status post transaxillary thyroidectomy by Dr. Kathi Simpers at [facility] -She denies evidence of Graves' ophthalmopathy: Double vision, blurry vision, chemosis, eye pain  2. Postop hypothyroidism - latest thyroid labs reviewed with pt >> normal at last check in 07/2019 by PCP  - she continues on LT4 112 mcg daily - she initially had nausea with levothyroxine tablet after she changed the tablets from [employer] to another manufacturer.  This is now resolved. - pt feels good on this dose.  She does have occasional tremors. - we discussed about taking the thyroid hormone every day, with water, >30 minutes before breakfast, separated by >4 hours from acid reflux medications, calcium, iron, multivitamins. Pt. is taking it correctly. - will check thyroid tests today: TSH and fT4 - If labs are abnormal, she will need to return for repeat TFTs in 1.5 months - She of her husband will try to get pregnant next year.  She is aware to come for labs before they start trying for a baby to make sure her TFTs are normal.  She is also aware that we will need to increase her levothyroxine dose during the pregnancy.  3.  Anxiety -At this visit, patient complains of a constellation of symptoms which include anxiety, palpitations, shortness of breath, chest pain, tremors.  She is stressed at work, as she is teaching both in person and virtually, but she is not sure whether her symptoms are  only related to stress at work, since they can happen at night. -She saw PCP in 07/2019 and complained of similar symptoms.  At that time, liver and kidney tests, glucose, hemoglobin, urinalysis, pregnancy test, were all normal.  She also took another pregnancy test this morning that it was negative. -We discussed at this visit that we need to check her thyroid tests to make sure she is not overly replaced with levothyroxine.  If the tests are normal, we can consider changing to Tirosint or, if not covered, 2 white tablets of levothyroxine to avoid an allergic reaction to the dyes in her levothyroxine.  -At this visit, we will also check a B12 and vitamin D  -However, since she has chest pain, palpitations, shortness of breath, and she is on oral contraceptives, I advised her to call PCP to see if she needs to be investigated for a blood clot/PE.  Of note, she does not have swelling in her legs.  Oxygen saturation is normal at this visit. She does appear tremulous, but does not have tachycardia. -If the investigation is negative, the next step would probably be to see cardiology -If all of the above investigation is negative, she will probably need treatment for anxiety.  *Her labs need to go to [facility]  Component     Latest Ref Rng & Units 12/14/2019  TSH     mIU/L 1.33  T4,Free(Direct)     0.8 - 1.8 ng/dL 1.3  Vitamin N56     213 - 1,100 pg/mL 283  Vitamin D, 25-Hydroxy     30 - 100 ng/mL 25 (L)   TFTs are excellent. Vitamin D level is low >> would suggest to start 2000 units daily and she will need a repeat in 2 months.  This can be done here or in PCPs office. Vitamin B12 level is at the lower limit of normal >> would suggest to start 1000 mcg B12 daily and she will need a repeat in 2 months.  This can be done here or in PCPs office.   Carlus Pavlov, MD PhD [facility] Endocrinology

## 2019-12-15 LAB — VITAMIN D 25 HYDROXY (VIT D DEFICIENCY, FRACTURES): Vit D, 25-Hydroxy: 25 ng/mL — ABNORMAL LOW (ref 30–100)

## 2019-12-15 LAB — T4, FREE: Free T4: 1.3 ng/dL (ref 0.8–1.8)

## 2019-12-15 LAB — VITAMIN B12: Vitamin B-12: 283 pg/mL (ref 200–1100)

## 2019-12-15 LAB — TSH: TSH: 1.33 mIU/L

## 2020-01-10 ENCOUNTER — Telehealth: Payer: Self-pay

## 2020-01-10 NOTE — Telephone Encounter (Signed)
Pt called no answer and no VM picked up unable to LM.

## 2020-01-10 NOTE — Telephone Encounter (Signed)
Sent pt a mychart message. 

## 2020-01-10 NOTE — Telephone Encounter (Signed)
Pt called no answer and no VM picked to LM.

## 2020-01-13 ENCOUNTER — Ambulatory Visit (INDEPENDENT_AMBULATORY_CARE_PROVIDER_SITE_OTHER): Payer: BC Managed Care – PPO | Admitting: Certified Nurse Midwife

## 2020-01-13 DIAGNOSIS — F419 Anxiety disorder, unspecified: Secondary | ICD-10-CM

## 2020-01-13 MED ORDER — BUSPIRONE HCL 7.5 MG PO TABS: 7.5000 mg | ORAL_TABLET | Freq: Three times a day (TID) | ORAL | 1 refills | Status: DC

## 2020-01-13 NOTE — Patient Instructions (Addendum)
Citalopram tablets What is this medicine? CITALOPRAM (sye TAL oh pram) is a medicine for depression. This medicine may be used for other purposes; ask your health care provider or pharmacist if you have questions. COMMON BRAND NAME(S): Celexa What should I tell my health care provider before I take this medicine? They need to know if you have any of these conditions:  bleeding disorders  bipolar disorder or a family history of bipolar disorder  glaucoma  heart disease  history of irregular heartbeat  kidney disease  liver disease  low levels of magnesium or potassium in the blood  receiving electroconvulsive therapy  seizures  suicidal thoughts, plans, or attempt; a previous suicide attempt by you or a family member  take medicines that treat or prevent blood clots  thyroid disease  an unusual or allergic reaction to citalopram, escitalopram, other medicines, foods, dyes, or preservatives  pregnant or trying to become pregnant  breast-feeding How should I use this medicine? Take this medicine by mouth with a glass of water. Follow the directions on the prescription label. You can take it with or without food. Take your medicine at regular intervals. Do not take your medicine more often than directed. Do not stop taking this medicine suddenly except upon the advice of your doctor. Stopping this medicine too quickly may cause serious side effects or your condition may worsen. A special MedGuide will be given to you by the pharmacist with each prescription and refill. Be sure to read this information carefully each time. Talk to your pediatrician regarding the use of this medicine in children. Special care may be needed. Patients over 47 years old may have a stronger reaction and need a smaller dose. Overdosage: If you think you have taken too much of this medicine contact a poison control center or emergency room at once. NOTE: This medicine is only for you. Do not share  this medicine with others. What if I miss a dose? If you miss a dose, take it as soon as you can. If it is almost time for your next dose, take only that dose. Do not take double or extra doses. What may interact with this medicine? Do not take this medicine with any of the following medications:  certain medicines for fungal infections like fluconazole, itraconazole, ketoconazole, posaconazole, voriconazole  cisapride  dronedarone  escitalopram  linezolid  MAOIs like Carbex, Eldepryl, Marplan, Nardil, and Parnate  methylene blue (injected into a vein)  pimozide  thioridazine This medicine may also interact with the following medications:  alcohol  amphetamines  aspirin and aspirin-like medicines  carbamazepine  certain medicines for depression, anxiety, or psychotic disturbances  certain medicines for infections like chloroquine, clarithromycin, erythromycin, furazolidone, isoniazid, pentamidine  certain medicines for migraine headaches like almotriptan, eletriptan, frovatriptan, naratriptan, rizatriptan, sumatriptan, zolmitriptan  certain medicines for sleep  certain medicines that treat or prevent blood clots like dalteparin, enoxaparin, warfarin  cimetidine  diuretics  dofetilide  fentanyl  lithium  methadone  metoprolol  NSAIDs, medicines for pain and inflammation, like ibuprofen or naproxen  omeprazole  other medicines that prolong the QT interval (cause an abnormal heart rhythm)  procarbazine  rasagiline  supplements like [place]. [place]'s wort, kava kava, valerian  tramadol  tryptophan  ziprasidone This list may not describe all possible interactions. Give your health care provider a list of all the medicines, herbs, non-prescription drugs, or dietary supplements you use. Also tell them if you smoke, drink alcohol, or use illegal drugs. Some items may interact with  your medicine. What should I watch for while using this medicine? Tell your  doctor if your symptoms do not get better or if they get worse. Visit your doctor or health care professional for regular checks on your progress. Because it may take several weeks to see the full effects of this medicine, it is important to continue your treatment as prescribed by your doctor. Patients and their families should watch out for new or worsening thoughts of suicide or depression. Also watch out for sudden changes in feelings such as feeling anxious, agitated, panicky, irritable, hostile, aggressive, impulsive, severely restless, overly excited and hyperactive, or not being able to sleep. If this happens, especially at the beginning of treatment or after a change in dose, call your health care professional. Dennis Bast may get drowsy or dizzy. Do not drive, use machinery, or do anything that needs mental alertness until you know how this medicine affects you. Do not stand or sit up quickly, especially if you are an older patient. This reduces the risk of dizzy or fainting spells. Alcohol may interfere with the effect of this medicine. Avoid alcoholic drinks. Your mouth may get dry. Chewing sugarless gum or sucking hard candy, and drinking plenty of water will help. Contact your doctor if the problem does not go away or is severe. What side effects may I notice from receiving this medicine? Side effects that you should report to your doctor or health care professional as soon as possible:  allergic reactions like skin rash, itching or hives, swelling of the face, lips, or tongue  anxious  black, tarry stools  breathing problems  changes in vision  chest pain  confusion  elevated mood, decreased need for sleep, racing thoughts, impulsive behavior  eye pain  fast, irregular heartbeat  feeling faint or lightheaded, falls  feeling agitated, angry, or irritable  hallucination, loss of contact with reality  loss of balance or coordination  loss of memory  painful or prolonged  erections  restlessness, pacing, inability to keep still  seizures  stiff muscles  suicidal thoughts or other mood changes  trouble sleeping  unusual bleeding or bruising  unusually weak or tired  vomiting Side effects that usually do not require medical attention (report to your doctor or health care professional if they continue or are bothersome):  change in appetite or weight  change in sex drive or performance  dizziness  headache  increased sweating  indigestion, nausea  tremors This list may not describe all possible side effects. Call your doctor for medical advice about side effects. You may report side effects to FDA at 1-800-FDA-1088. Where should I keep my medicine? Keep out of reach of children. Store at room temperature between 15 and 30 degrees C (59 and 86 degrees F). Throw away any unused medicine after the expiration date. NOTE: This sheet is a summary. It may not cover all possible information. If you have questions about this medicine, talk to your doctor, pharmacist, or health care provider.  2020 Elsevier/Gold Standard (2018-07-27 09:05:36)   Bupropion tablets (Depression/Mood Disorders) What is this medicine? BUPROPION (byoo PROE pee on) is used to treat depression. This medicine may be used for other purposes; ask your health care provider or pharmacist if you have questions. COMMON BRAND NAME(S): Wellbutrin What should I tell my health care provider before I take this medicine? They need to know if you have any of these conditions:  an eating disorder, such as anorexia or bulimia  bipolar disorder or psychosis  diabetes or high blood sugar, treated with medication  glaucoma  heart disease, previous heart attack, or irregular heart beat  head injury or brain tumor  high blood pressure  kidney or liver disease  seizures  suicidal thoughts or a previous suicide attempt  Tourette's syndrome  weight loss  an unusual or  allergic reaction to bupropion, other medicines, foods, dyes, or preservatives  breast-feeding  pregnant or trying to become pregnant How should I use this medicine? Take this medicine by mouth with a glass of water. Follow the directions on the prescription label. You can take it with or without food. If it upsets your stomach, take it with food. Take your medicine at regular intervals. Do not take your medicine more often than directed. Do not stop taking this medicine suddenly except upon the advice of your doctor. Stopping this medicine too quickly may cause serious side effects or your condition may worsen. A special MedGuide will be given to you by the pharmacist with each prescription and refill. Be sure to read this information carefully each time. Talk to your pediatrician regarding the use of this medicine in children. Special care may be needed. Overdosage: If you think you have taken too much of this medicine contact a poison control center or emergency room at once. NOTE: This medicine is only for you. Do not share this medicine with others. What if I miss a dose? If you miss a dose, take it as soon as you can. If it is less than four hours to your next dose, take only that dose and skip the missed dose. Do not take double or extra doses. What may interact with this medicine? Do not take this medicine with any of the following medications:  linezolid  MAOIs like Azilect, Carbex, Eldepryl, Marplan, Nardil, and Parnate  methylene blue (injected into a vein)  other medicines that contain bupropion like Zyban This medicine may also interact with the following medications:  alcohol  certain medicines for anxiety or sleep  certain medicines for blood pressure like metoprolol, propranolol  certain medicines for depression or psychotic disturbances  certain medicines for HIV or AIDS like efavirenz, lopinavir, nelfinavir, ritonavir  certain medicines for irregular heart beat  like propafenone, flecainide  certain medicines for Parkinson's disease like amantadine, levodopa  certain medicines for seizures like carbamazepine, phenytoin, phenobarbital  cimetidine  clopidogrel  cyclophosphamide  digoxin  furazolidone  isoniazid  nicotine  orphenadrine  procarbazine  steroid medicines like prednisone or cortisone  stimulant medicines for attention disorders, weight loss, or to stay awake  tamoxifen  theophylline  thiotepa  ticlopidine  tramadol  warfarin This list may not describe all possible interactions. Give your health care provider a list of all the medicines, herbs, non-prescription drugs, or dietary supplements you use. Also tell them if you smoke, drink alcohol, or use illegal drugs. Some items may interact with your medicine. What should I watch for while using this medicine? Tell your doctor if your symptoms do not get better or if they get worse. Visit your doctor or healthcare provider for regular checks on your progress. Because it may take several weeks to see the full effects of this medicine, it is important to continue your treatment as prescribed by your doctor. This medicine may cause serious skin reactions. They can happen weeks to months after starting the medicine. Contact your healthcare provider right away if you notice fevers or flu-like symptoms with a rash. The rash may be red or purple and  then turn into blisters or peeling of the skin. Or, you might notice a red rash with swelling of the face, lips or lymph nodes in your neck or under your arms. Patients and their families should watch out for new or worsening thoughts of suicide or depression. Also watch out for sudden changes in feelings such as feeling anxious, agitated, panicky, irritable, hostile, aggressive, impulsive, severely restless, overly excited and hyperactive, or not being able to sleep. If this happens, especially at the beginning of treatment or after a  change in dose, call your healthcare provider. Avoid alcoholic drinks while taking this medicine. Drinking excessive alcoholic beverages, using sleeping or anxiety medicines, or quickly stopping the use of these agents while taking this medicine may increase your risk for a seizure. Do not drive or use heavy machinery until you know how this medicine affects you. This medicine can impair your ability to perform these tasks. Do not take this medicine close to bedtime. It may prevent you from sleeping. Your mouth may get dry. Chewing sugarless gum or sucking hard candy, and drinking plenty of water may help. Contact your doctor if the problem does not go away or is severe. What side effects may I notice from receiving this medicine? Side effects that you should report to your doctor or health care professional as soon as possible:  allergic reactions like skin rash, itching or hives, swelling of the face, lips, or tongue  breathing problems  changes in vision  confusion  elevated mood, decreased need for sleep, racing thoughts, impulsive behavior  fast or irregular heartbeat  hallucinations, loss of contact with reality  increased blood pressure  rash, fever, and swollen lymph nodes  redness, blistering, peeling, or loosening of the skin, including inside the mouth  seizures  suicidal thoughts or other mood changes  unusually weak or tired  vomiting Side effects that usually do not require medical attention (report to your doctor or health care professional if they continue or are bothersome):  constipation  headache  loss of appetite  nausea  tremors  weight loss This list may not describe all possible side effects. Call your doctor for medical advice about side effects. You may report side effects to FDA at 1-800-FDA-1088. Where should I keep my medicine? Keep out of the reach of children. Store at room temperature between 20 and 25 degrees C (68 and 77 degrees F),  away from direct sunlight and moisture. Keep tightly closed. Throw away any unused medicine after the expiration date. NOTE: This sheet is a summary. It may not cover all possible information. If you have questions about this medicine, talk to your doctor, pharmacist, or health care provider.  2020 Elsevier/Gold Standard (2018-10-29 14:02:47)   Buspirone tablets What is this medicine? BUSPIRONE (byoo SPYE rone) is used to treat anxiety disorders. This medicine may be used for other purposes; ask your health care provider or pharmacist if you have questions. COMMON BRAND NAME(S): BuSpar What should I tell my health care provider before I take this medicine? They need to know if you have any of these conditions:  kidney or liver disease  an unusual or allergic reaction to buspirone, other medicines, foods, dyes, or preservatives  pregnant or trying to get pregnant  breast-feeding How should I use this medicine? Take this medicine by mouth with a glass of water. Follow the directions on the prescription label. You may take this medicine with or without food. To ensure that this medicine always works the same  way for you, you should take it either always with or always without food. Take your doses at regular intervals. Do not take your medicine more often than directed. Do not stop taking except on the advice of your doctor or health care professional. Talk to your pediatrician regarding the use of this medicine in children. Special care may be needed. Overdosage: If you think you have taken too much of this medicine contact a poison control center or emergency room at once. NOTE: This medicine is only for you. Do not share this medicine with others. What if I miss a dose? If you miss a dose, take it as soon as you can. If it is almost time for your next dose, take only that dose. Do not take double or extra doses. What may interact with this medicine? Do not take this medicine with any of  the following medications:  linezolid  MAOIs like Carbex, Eldepryl, Marplan, Nardil, and Parnate  methylene blue  procarbazine This medicine may also interact with the following medications:  diazepam  digoxin  diltiazem  erythromycin  grapefruit juice  haloperidol  medicines for mental depression or mood problems  medicines for seizures like carbamazepine, phenobarbital and phenytoin  nefazodone  other medications for anxiety  rifampin  ritonavir  some antifungal medicines like itraconazole, ketoconazole, and voriconazole  verapamil  warfarin This list may not describe all possible interactions. Give your health care provider a list of all the medicines, herbs, non-prescription drugs, or dietary supplements you use. Also tell them if you smoke, drink alcohol, or use illegal drugs. Some items may interact with your medicine. What should I watch for while using this medicine? Visit your doctor or health care professional for regular checks on your progress. It may take 1 to 2 weeks before your anxiety gets better. You may get drowsy or dizzy. Do not drive, use machinery, or do anything that needs mental alertness until you know how this drug affects you. Do not stand or sit up quickly, especially if you are an older patient. This reduces the risk of dizzy or fainting spells. Alcohol can make you more drowsy and dizzy. Avoid alcoholic drinks. What side effects may I notice from receiving this medicine? Side effects that you should report to your doctor or health care professional as soon as possible:  blurred vision or other vision changes  chest pain  confusion  difficulty breathing  feelings of hostility or anger  muscle aches and pains  numbness or tingling in hands or feet  ringing in the ears  skin rash and itching  vomiting  weakness Side effects that usually do not require medical attention (report to your doctor or health care professional if  they continue or are bothersome):  disturbed dreams, nightmares  headache  nausea  restlessness or nervousness  sore throat and nasal congestion  stomach upset This list may not describe all possible side effects. Call your doctor for medical advice about side effects. You may report side effects to FDA at 1-800-FDA-1088. Where should I keep my medicine? Keep out of the reach of children. Store at room temperature below 30 degrees C (86 degrees F). Protect from light. Keep container tightly closed. Throw away any unused medicine after the expiration date. NOTE: This sheet is a summary. It may not cover all possible information. If you have questions about this medicine, talk to your doctor, pharmacist, or health care provider.  2020 Elsevier/Gold Standard (2010-03-15 18:06:11)  Paroxetine tablets What is this  medicine? PAROXETINE (pa ROX e teen) is used to treat depression. It may also be used to treat anxiety disorders, obsessive compulsive disorder, panic attacks, post traumatic stress, and premenstrual dysphoric disorder (PMDD). This medicine may be used for other purposes; ask your health care provider or pharmacist if you have questions. COMMON BRAND NAME(S): Paxil, Pexeva What should I tell my health care provider before I take this medicine? They need to know if you have any of these conditions:  bipolar disorder or a family history of bipolar disorder  bleeding disorders  glaucoma  heart disease  kidney disease  liver disease  low levels of sodium in the blood  seizures  suicidal thoughts, plans, or attempt; a previous suicide attempt by you or a family member  take MAOIs like Carbex, Eldepryl, Marplan, Nardil, and Parnate  take medicines that treat or prevent blood clots  thyroid disease  an unusual or allergic reaction to paroxetine, other medicines, foods, dyes, or preservatives  pregnant or trying to get pregnant  breast-feeding How should I use  this medicine? Take this medicine by mouth with a glass of water. Follow the directions on the prescription label. You can take it with or without food. Take your medicine at regular intervals. Do not take your medicine more often than directed. Do not stop taking this medicine suddenly except upon the advice of your doctor. Stopping this medicine too quickly may cause serious side effects or your condition may worsen. A special MedGuide will be given to you by the pharmacist with each prescription and refill. Be sure to read this information carefully each time. Talk to your pediatrician regarding the use of this medicine in children. Special care may be needed. Overdosage: If you think you have taken too much of this medicine contact a poison control center or emergency room at once. NOTE: This medicine is only for you. Do not share this medicine with others. What if I miss a dose? If you miss a dose, take it as soon as you can. If it is almost time for your next dose, take only that dose. Do not take double or extra doses. What may interact with this medicine? Do not take this medicine with any of the following medications:  linezolid  MAOIs like Carbex, Eldepryl, Marplan, Nardil, and Parnate  methylene blue (injected into a vein)  pimozide  thioridazine This medicine may also interact with the following medications:  alcohol  amphetamines  aspirin and aspirin-like medicines  atomoxetine  certain medicines for depression, anxiety, or psychotic disturbances  certain medicines for irregular heart beat like propafenone, flecainide, encainide, and quinidine  certain medicines for migraine headache like almotriptan, eletriptan, frovatriptan, naratriptan, rizatriptan, sumatriptan, zolmitriptan  cimetidine  digoxin  diuretics  fentanyl  fosamprenavir  furazolidone  isoniazid  lithium  medicines that treat or prevent blood clots like warfarin, enoxaparin, and  dalteparin  medicines for sleep  NSAIDs, medicines for pain and inflammation, like ibuprofen or naproxen  phenobarbital  phenytoin  procarbazine  rasagiline  ritonavir  supplements like St. John's wort, kava kava, valerian  tamoxifen  tramadol  tryptophan This list may not describe all possible interactions. Give your health care provider a list of all the medicines, herbs, non-prescription drugs, or dietary supplements you use. Also tell them if you smoke, drink alcohol, or use illegal drugs. Some items may interact with your medicine. What should I watch for while using this medicine? Tell your doctor if your symptoms do not get better or if  they get worse. Visit your doctor or health care professional for regular checks on your progress. Because it may take several weeks to see the full effects of this medicine, it is important to continue your treatment as prescribed by your doctor. Patients and their families should watch out for new or worsening thoughts of suicide or depression. Also watch out for sudden changes in feelings such as feeling anxious, agitated, panicky, irritable, hostile, aggressive, impulsive, severely restless, overly excited and hyperactive, or not being able to sleep. If this happens, especially at the beginning of treatment or after a change in dose, call your health care professional. Bonita Quin may get drowsy or dizzy. Do not drive, use machinery, or do anything that needs mental alertness until you know how this medicine affects you. Do not stand or sit up quickly, especially if you are an older patient. This reduces the risk of dizzy or fainting spells. Alcohol may interfere with the effect of this medicine. Avoid alcoholic drinks. Your mouth may get dry. Chewing sugarless gum or sucking hard candy, and drinking plenty of water will help. Contact your doctor if the problem does not go away or is severe. What side effects may I notice from receiving this  medicine? Side effects that you should report to your doctor or health care professional as soon as possible:  allergic reactions like skin rash, itching or hives, swelling of the face, lips, or tongue  anxious  black, tarry stools  changes in vision  confusion  elevated mood, decreased need for sleep, racing thoughts, impulsive behavior  eye pain  fast, irregular heartbeat  feeling faint or lightheaded, falls  feeling agitated, angry, or irritable  hallucination, loss of contact with reality  loss of balance or coordination  loss of memory  painful or prolonged erections  restlessness, pacing, inability to keep still  seizures  stiff muscles  suicidal thoughts or other mood changes  trouble sleeping  unusual bleeding or bruising  unusually weak or tired  vomiting Side effects that usually do not require medical attention (report to your doctor or health care professional if they continue or are bothersome):  change in appetite or weight  change in sex drive or performance  diarrhea  dizziness  dry mouth  increased sweating  indigestion, nausea  tired  tremors This list may not describe all possible side effects. Call your doctor for medical advice about side effects. You may report side effects to FDA at 1-800-FDA-1088. Where should I keep my medicine? Keep out of the reach of children. Store at room temperature between 15 and 30 degrees C (59 and 86 degrees F). Keep container tightly closed. Throw away any unused medicine after the expiration date. NOTE: This sheet is a summary. It may not cover all possible information. If you have questions about this medicine, talk to your doctor, pharmacist, or health care provider.  2020 Elsevier/Gold Standard (2016-01-06 15:50:32)

## 2020-01-13 NOTE — Progress Notes (Signed)
Virtual Visit via Telephone Note  I connected with MICKIE BADDERS on 01/13/20 at  4:45 PM EDT by telephone and verified that I am speaking with the correct person using two identifiers.  Location:  Patient: Debra [name]. [place]  Provider: Gunnar Bulla, CNM   I discussed the limitations, risks, security and privacy concerns of performing an evaluation and management service by telephone and the availability of in person appointments. I also discussed with the patient that there may be a patient responsible charge related to this service. The patient expressed understanding and agreed to proceed.   History of Present Illness:  Patient reports increased symptoms of anxiety and "panic attacks".   Has been speaking with pastor and reflects that symptoms have been around for longer than she realized.   Things has worsen with COVID and teaching virtually.   Symptoms include abdominal pain as well, questions if managing anxiety will help.   No SI/HI.   Desires management of symptoms, but does not want to become "dependent on medication".   Observations/Objective:  GAD 7 : Generalized Anxiety Score 01/13/2020  Nervous, Anxious, on Edge 3  Control/stop worrying 3  Worry too much - different things 2  Trouble relaxing 3  Restless 2  Easily annoyed or irritable 2  Afraid - awful might happen 3  Total GAD 7 Score 18  Anxiety Difficulty Somewhat difficult   Assessment and Plan:  Anxiety-Rx BuSpar  Follow Up Instructions:  Discussed options for symptom management including counselor, use of herbal rememdies, and medications; list sent via MyChart.   Reviewed red flag symptoms and when to call.   RTC x 4-6 weeks for mood check or sooner if needed.   I discussed the assessment and treatment plan with the patient. The patient was provided an opportunity to ask questions and all were answered. The patient agreed with the plan and demonstrated an understanding of the  instructions.   The patient was advised to call back or seek an in-person evaluation if the symptoms worsen or if the condition fails to improve as anticipated.  I provided 25 minutes of non-face-to-face time during this encounter.   Gunnar Bulla, CNM [facility], [facility] 01/13/20 5:09 PM

## 2020-01-13 NOTE — Progress Notes (Signed)
Contacted patient for televisit. DOB as identifier. Patient has c/o anxiety. GAD = 18. Call transferred to [facility] CNM

## 2020-02-04 ENCOUNTER — Other Ambulatory Visit: Payer: Self-pay | Admitting: Certified Nurse Midwife

## 2020-02-22 ENCOUNTER — Other Ambulatory Visit: Payer: Self-pay | Admitting: Certified Nurse Midwife

## 2020-02-26 ENCOUNTER — Other Ambulatory Visit: Payer: Self-pay | Admitting: Internal Medicine

## 2020-02-28 ENCOUNTER — Other Ambulatory Visit: Payer: Self-pay

## 2020-02-28 ENCOUNTER — Telehealth: Payer: Self-pay

## 2020-02-28 ENCOUNTER — Telehealth: Payer: Self-pay | Admitting: Certified Nurse Midwife

## 2020-02-28 MED ORDER — DROSPIRENONE-ETHINYL ESTRADIOL 3-0.02 MG PO TABS: 1.0000 | ORAL_TABLET | Freq: Every day | ORAL | 0 refills | Status: DC

## 2020-02-28 NOTE — Telephone Encounter (Signed)
mychart message sent to patient

## 2020-02-28 NOTE — Telephone Encounter (Signed)
Patient needs refill on her birth control. Could you please advise.

## 2020-03-27 ENCOUNTER — Other Ambulatory Visit (HOSPITAL_COMMUNITY)
Admission: RE | Admit: 2020-03-27 | Discharge: 2020-03-27 | Disposition: A | Discharge status: Home / Self Care | Payer: BC Managed Care – PPO | Source: Ambulatory Visit | Attending: Obstetrics and Gynecology | Admitting: Obstetrics and Gynecology

## 2020-03-27 ENCOUNTER — Ambulatory Visit (INDEPENDENT_AMBULATORY_CARE_PROVIDER_SITE_OTHER): Payer: BC Managed Care – PPO | Admitting: Obstetrics and Gynecology

## 2020-03-27 ENCOUNTER — Encounter: Payer: Self-pay | Admitting: Obstetrics and Gynecology

## 2020-03-27 ENCOUNTER — Other Ambulatory Visit: Payer: Self-pay

## 2020-03-27 VITALS — BP 124/60 | HR 80 | Ht 69.0 in | Wt 165.0 lb

## 2020-03-27 DIAGNOSIS — Z3041 Encounter for surveillance of contraceptive pills: Secondary | ICD-10-CM | POA: Diagnosis not present

## 2020-03-27 DIAGNOSIS — Z124 Encounter for screening for malignant neoplasm of cervix: Secondary | ICD-10-CM

## 2020-03-27 DIAGNOSIS — Z01419 Encounter for gynecological examination (general) (routine) without abnormal findings: Secondary | ICD-10-CM

## 2020-03-27 DIAGNOSIS — F411 Generalized anxiety disorder: Secondary | ICD-10-CM

## 2020-03-27 MED ORDER — BUSPIRONE HCL 7.5 MG PO TABS: 7.5000 mg | ORAL_TABLET | Freq: Three times a day (TID) | ORAL | 3 refills | Status: DC

## 2020-03-27 MED ORDER — DROSPIRENONE-ETHINYL ESTRADIOL 3-0.02 MG PO TABS: 1.0000 | ORAL_TABLET | Freq: Every day | ORAL | 3 refills | Status: DC

## 2020-03-27 NOTE — Progress Notes (Signed)
Gynecology Annual Exam  PCP: Marisue Ivan, MD  Chief Complaint:  Chief Complaint  Patient presents with  . Gynecologic Exam    Establish care, refill birthcontrol, discuss anxiety/depression    History of Present Illness: Patient is a 24 y.o. G0P0000 presents for annual exam. The patient has no complaints today.   LMP: No LMP recorded. (Menstrual status: Oral contraceptives). Amenorrhea on OCP  The patient is sexually active. She currently uses OCP (estrogen/progesterone) for contraception. She denies dyspareunia.  The patient does not perform self breast exams.  There is no notable family history of breast or ovarian cancer in her family.  The patient wears seatbelts: yes.  The patient has regular exercise: not asked.    The patient repots current symptoms of anxiety, well controlled on current dose of Buspar.    Review of Systems: Review of Systems  Constitutional: Negative for chills and fever.  HENT: Negative for congestion.   Respiratory: Negative for cough and shortness of breath.   Cardiovascular: Negative for chest pain and palpitations.  Gastrointestinal: Negative for abdominal pain, constipation, diarrhea, heartburn, nausea and vomiting.  Genitourinary: Negative for dysuria, frequency and urgency.  Skin: Negative for itching and rash.  Neurological: Negative for dizziness and headaches.  Endo/Heme/Allergies: Negative for polydipsia.  Psychiatric/Behavioral: Negative for depression.    Past Medical History:  Patient Active Problem List   Diagnosis Date Noted  . 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 Chest pain 03/17/2017  . H/O Graves' disease 01/10/2015    Past Surgical History:  Past Surgical History:  Procedure Laterality Date  . TONSILLECTOMY AND ADENOIDECTOMY  2012  . TOTAL THYROIDECTOMY      Gynecologic History:  No LMP recorded. (Menstrual status: Oral contraceptives). Contraception: OCP (estrogen/progesterone) Last Pap: Results  were: 07/25/2017 no abnormalities   Obstetric History: G0P0000  Family History:  Family History  Problem Relation Age of Onset  . Thyroid disease Other   . Hypertension Maternal Grandfather     Social History:  Social History   Socioeconomic History  . Marital status: Single    Spouse name: Not on file  . Number of children: Not on file  . Years of education: Not on file  . Highest education level: Not on file  Occupational History  . Occupation: Scientist, product/process development  . Occupation: Engineering geologist of Dance  Tobacco Use  . Smoking status: Never Smoker  . Smokeless tobacco: Never Used  Vaping Use  . Vaping Use: Never used  Substance and Sexual Activity  . Alcohol use: Yes  . Drug use: No  . Sexual activity: Yes    Birth control/protection: Pill  Other Topics Concern  . Not on file  Social History Narrative  . Not on file   Social Determinants of Health   Financial Resource Strain:   . Difficulty of Paying Living Expenses:   Food Insecurity:   . Worried About Programme researcher, broadcasting/film/video in the Last Year:   . Barista in the Last Year:   Transportation Needs:   . Freight forwarder (Medical):   Marland Kitchen Lack of Transportation (Non-Medical):   Physical Activity:   . Days of Exercise per Week:   . Minutes of Exercise per Session:   Stress:   . Feeling of Stress :   Social Connections:   . Frequency of Communication with Friends and Family:   . Frequency of Social Gatherings with Friends and Family:   . Attends Religious Services:   . Active  Member of Clubs or Organizations:   . Attends Banker Meetings:   Marland Kitchen Marital Status:   Intimate Partner Violence:   . Fear of Current or Ex-Partner:   . Emotionally Abused:   Marland Kitchen Physically Abused:   . Sexually Abused:     Allergies:  Allergies  Allergen Reactions  . Sulfa Antibiotics Rash    Medications: Prior to Admission medications   Medication Sig Start Date End Date Taking? Authorizing Provider    busPIRone (BUSPAR) 7.5 MG tablet Take 1 tablet (7.5 mg total) by mouth 3 (three) times daily. 01/13/20  Yes Lawhorn, Vanessa [place], CNM  drospirenone-ethinyl estradiol (YAZ) 3-0.02 MG tablet Take 1 tablet by mouth daily. 02/28/20  Yes Lawhorn, Vanessa [place], CNM  levothyroxine (SYNTHROID) 112 MCG tablet TAKE 1 TABLET (112 MCG TOTAL) BY MOUTH DAILY BEFORE BREAKFAST 02/28/20  Yes Carlus Pavlov, MD    Physical Exam Vitals: Blood pressure 124/60, pulse 80, height 5\' 9"  (1.753 m), weight 165 lb (74.8 kg).  General: NAD HEENT: normocephalic, anicteric Pulmonary: No increased work of breathing, CTAB Cardiovascular: RRR, distal pulses 2+ Breast: Breast symmetrical, no tenderness, no palpable nodules or masses, no skin or nipple retraction present, no nipple discharge.  No axillary or supraclavicular lymphadenopathy. Abdomen: NABS, soft, non-tender, non-distended.  Umbilicus without lesions.  No hepatomegaly, splenomegaly or masses palpable. No evidence of hernia  Genitourinary:  External: Normal external female genitalia.  Normal urethral meatus, normal Bartholin's and Skene's glands.    Vagina: Normal vaginal mucosa, no evidence of prolapse.    Cervix: Grossly normal in appearance, no bleeding  Uterus: Non-enlarged, mobile, normal contour.  No CMT  Adnexa: ovaries non-enlarged, no adnexal masses  Rectal: deferred  Lymphatic: no evidence of inguinal lymphadenopathy Extremities: no edema, erythema, or tenderness Neurologic: Grossly intact Psychiatric: mood appropriate, affect full  Female chaperone present for pelvic and breast  portions of the physical exam    Assessment: 24 y.o. G0P0000 routine annual exam  Plan: Problem List Items Addressed This Visit    None    Visit Diagnoses    Encounter for gynecological examination without abnormal finding    -  Primary   Screening for malignant neoplasm of cervix       Relevant Orders   Cytology - PAP   Generalized anxiety disorder        Relevant Medications   busPIRone (BUSPAR) 7.5 MG tablet   Oral contraceptive pill surveillance          1) COVID vaccination - discussed current consensus regarding COVID vaccination and particular importance for individuals interested in pregnancy within the near future.  We discussed that there is no data to suggest infertility for patient's following vaccination.    2) STI screening  was notoffered and therefore not obtained  3)  ASCCP guidelines and rational discussed.  Patient opts for every 3 years screening interval  4) Contraception - the patient is currently using  OCP (estrogen/progesterone).  She is happy with her current form of contraception and plans to continue We discussed safe sex practices to reduce her furture risk of STI's.    5) Hypothyroidism - we discussed importance of proper thyroid hormone replacement in ensuring regular ovulation.  To start PNV once ready to start conceiving.  If no menses within 4-6 weeks of discontinuation of OCP would proceed with further work up  6) Generalized Anxiety - refilled Buspar  7) Return in about 1 year (around 03/27/2021) for Annual.   05/27/2021, MD, [facility]  OB/GYN, [facility] 03/27/2020, 10:11 AM

## 2020-03-28 LAB — CYTOLOGY - PAP: Diagnosis: NEGATIVE

## 2020-03-30 ENCOUNTER — Ambulatory Visit: Payer: 59 | Admitting: Family Medicine

## 2020-03-30 ENCOUNTER — Ambulatory Visit (INDEPENDENT_AMBULATORY_CARE_PROVIDER_SITE_OTHER): Payer: BC Managed Care – PPO | Admitting: Family Medicine

## 2020-03-30 ENCOUNTER — Encounter: Payer: Self-pay | Admitting: Family Medicine

## 2020-03-30 ENCOUNTER — Other Ambulatory Visit: Payer: Self-pay

## 2020-03-30 VITALS — BP 110/76 | HR 67 | Temp 98.4°F | Resp 16 | Ht 69.0 in | Wt 164.2 lb

## 2020-03-30 DIAGNOSIS — Z23 Encounter for immunization: Secondary | ICD-10-CM | POA: Diagnosis not present

## 2020-03-30 DIAGNOSIS — E890 Postprocedural hypothyroidism: Secondary | ICD-10-CM

## 2020-03-30 DIAGNOSIS — L409 Psoriasis, unspecified: Secondary | ICD-10-CM | POA: Diagnosis not present

## 2020-03-30 DIAGNOSIS — K5909 Other constipation: Secondary | ICD-10-CM

## 2020-03-30 DIAGNOSIS — Z8639 Personal history of other endocrine, nutritional and metabolic disease: Secondary | ICD-10-CM

## 2020-03-30 DIAGNOSIS — F411 Generalized anxiety disorder: Secondary | ICD-10-CM

## 2020-03-30 MED ORDER — CLOBETASOL PROPIONATE 0.05 % EX SOLN: 1.0000 "application " | Freq: Two times a day (BID) | CUTANEOUS | 3 refills | Status: DC

## 2020-03-30 NOTE — Patient Instructions (Addendum)
Try taking Miralax daily to help with symptoms of constipation.  Health Maintenance, Female Adopting a healthy lifestyle and getting preventive care are important in promoting health and wellness. Ask your health care provider about:  The right schedule for you to have regular tests and exams.  Things you can do on your own to prevent diseases and keep yourself healthy. What should I know about diet, weight, and exercise? Eat a healthy diet   Eat a diet that includes plenty of vegetables, fruits, low-fat dairy products, and lean protein.  Do not eat a lot of foods that are high in solid fats, added sugars, or sodium. Maintain a healthy weight Body mass index (BMI) is used to identify weight problems. It estimates body fat based on height and weight. Your health care provider can help determine your BMI and help you achieve or maintain a healthy weight. Get regular exercise Get regular exercise. This is one of the most important things you can do for your health. Most adults should:  Exercise for at least 150 minutes each week. The exercise should increase your heart rate and make you sweat (moderate-intensity exercise).  Do strengthening exercises at least twice a week. This is in addition to the moderate-intensity exercise.  Spend less time sitting. Even light physical activity can be beneficial. Watch cholesterol and blood lipids Have your blood tested for lipids and cholesterol at 25 years of age, then have this test every 5 years. Have your cholesterol levels checked more often if:  Your lipid or cholesterol levels are high.  You are older than 25 years of age.  You are at high risk for heart disease. What should I know about cancer screening? Depending on your health history and family history, you may need to have cancer screening at various ages. This may include screening for:  Breast cancer.  Cervical cancer.  Colorectal cancer.  Skin cancer.  Lung cancer. What  should I know about heart disease, diabetes, and high blood pressure? Blood pressure and heart disease  High blood pressure causes heart disease and increases the risk of stroke. This is more likely to develop in people who have high blood pressure readings, are of African descent, or are overweight.  Have your blood pressure checked: ? Every 3-5 years if you are 48-70 years of age. ? Every year if you are 57 years old or older. Diabetes Have regular diabetes screenings. This checks your fasting blood sugar level. Have the screening done:  Once every three years after age 66 if you are at a normal weight and have a low risk for diabetes.  More often and at a younger age if you are overweight or have a high risk for diabetes. What should I know about preventing infection? Hepatitis B If you have a higher risk for hepatitis B, you should be screened for this virus. Talk with your health care provider to find out if you are at risk for hepatitis B infection. Hepatitis C Testing is recommended for:  Everyone born from [birth year] through 1966.  Anyone with known risk factors for hepatitis C. Sexually transmitted infections (STIs)  Get screened for STIs, including gonorrhea and chlamydia, if: ? You are sexually active and are younger than 25 years of age. ? You are older than 25 years of age and your health care provider tells you that you are at risk for this type of infection. ? Your sexual activity has changed since you were last screened, and you are at increased  risk for chlamydia or gonorrhea. Ask your health care provider if you are at risk.  Ask your health care provider about whether you are at high risk for HIV. Your health care provider may recommend a prescription medicine to help prevent HIV infection. If you choose to take medicine to prevent HIV, you should first get tested for HIV. You should then be tested every 3 months for as long as you are taking the medicine. Pregnancy  If  you are about to stop having your period (premenopausal) and you may become pregnant, seek counseling before you get pregnant.  Take 400 to 800 micrograms (mcg) of folic acid every day if you become pregnant.  Ask for birth control (contraception) if you want to prevent pregnancy. Osteoporosis and menopause Osteoporosis is a disease in which the bones lose minerals and strength with aging. This can result in bone fractures. If you are 4 years old or older, or if you are at risk for osteoporosis and fractures, ask your health care provider if you should:  Be screened for bone loss.  Take a calcium or vitamin D supplement to lower your risk of fractures.  Be given hormone replacement therapy (HRT) to treat symptoms of menopause. Follow these instructions at home: Lifestyle  Do not use any products that contain nicotine or tobacco, such as cigarettes, e-cigarettes, and chewing tobacco. If you need help quitting, ask your health care provider.  Do not use street drugs.  Do not share needles.  Ask your health care provider for help if you need support or information about quitting drugs. Alcohol use  Do not drink alcohol if: ? Your health care provider tells you not to drink. ? You are pregnant, may be pregnant, or are planning to become pregnant.  If you drink alcohol: ? Limit how much you use to 0-1 drink a day. ? Limit intake if you are breastfeeding.  Be aware of how much alcohol is in your drink. In the [place] one drink equals one 12 oz bottle of beer (355 mL), one 5 oz glass of wine (148 mL), or one 1 oz glass of hard liquor (44 mL). General instructions  Schedule regular health, dental, and eye exams.  Stay current with your vaccines.  Tell your health care provider if: ? You often feel depressed. ? You have ever been abused or do not feel safe at home. Summary  Adopting a healthy lifestyle and getting preventive care are important in promoting health and  wellness.  Follow your health care provider's instructions about healthy diet, exercising, and getting tested or screened for diseases.  Follow your health care provider's instructions on monitoring your cholesterol and blood pressure. This information is not intended to replace advice given to you by your health care provider. Make sure you discuss any questions you have with your health care provider. Document Revised: 07/29/2018 Document Reviewed: 07/29/2018 Elsevier Patient Education  2020 Elsevier Inc.   Breast Self-Awareness Breast self-awareness is knowing how your breasts look and feel. Doing breast self-awareness is important. It allows you to catch a breast problem early while it is still small and can be treated. All women should do breast self-awareness, including women who have had breast implants. Tell your doctor if you notice a change in your breasts. What you need:  A mirror.  A well-lit room. How to do a breast self-exam A breast self-exam is one way to learn what is normal for your breasts and to check for changes. To do  a breast self-exam: Look for changes  1. Take off all the clothes above your waist. 2. Stand in front of a mirror in a room with good lighting. 3. Put your hands on your hips. 4. Push your hands down. 5. Look at your breasts and nipples in the mirror to see if one breast or nipple looks different from the other. Check to see if: ? The shape of one breast is different. ? The size of one breast is different. ? There are wrinkles, dips, and bumps in one breast and not the other. 6. Look at each breast for changes in the skin, such as: ? Redness. ? Scaly areas. 7. Look for changes in your nipples, such as: ? Liquid around the nipples. ? Bleeding. ? Dimpling. ? Redness. ? A change in where the nipples are. Feel for changes  1. Lie on your back on the floor. 2. Feel each breast. To do this, follow these steps: ? Pick a breast to feel. ? Put  the arm closest to that breast above your head. ? Use your other arm to feel the nipple area of your breast. Feel the area with the pads of your three middle fingers by making small circles with your fingers. For the first circle, press lightly. For the second circle, press harder. For the third circle, press even harder. ? Keep making circles with your fingers at the different pressures as you move down your breast. Stop when you feel your ribs. ? Move your fingers a little toward the center of your body. ? Start making circles with your fingers again, this time going up until you reach your collarbone. ? Keep making up-and-down circles until you reach your armpit. Remember to keep using the three pressures. ? Feel the other breast in the same way. 3. Sit or stand in the tub or shower. 4. With soapy water on your skin, feel each breast the same way you did in step 2 when you were lying on the floor. Write down what you find Writing down what you find can help you remember what to tell your doctor. Write down:  What is normal for each breast.  Any changes you find in each breast, including: ? The kind of changes you find. ? Whether you have pain. ? Size and location of any lumps.  When you last had your menstrual period. General tips  Check your breasts every month.  If you are breastfeeding, the best time to check your breasts is after you feed your baby or after you use a breast pump.  If you get menstrual periods, the best time to check your breasts is 5-7 days after your menstrual period is over.  With time, you will become comfortable with the self-exam, and you will begin to know if there are changes in your breasts. Contact a doctor if you:  See a change in the shape or size of your breasts or nipples.  See a change in the skin of your breast or nipples, such as red or scaly skin.  Have fluid coming from your nipples that is not normal.  Find a lump or thick area that was  not there before.  Have pain in your breasts.  Have any concerns about your breast health. Summary  Breast self-awareness includes looking for changes in your breasts, as well as feeling for changes within your breasts.  Breast self-awareness should be done in front of a mirror in a well-lit room.  You should check  your breasts every month. If you get menstrual periods, the best time to check your breasts is 5-7 days after your menstrual period is over.  Let your doctor know of any changes you see in your breasts, including changes in size, changes on the skin, pain or tenderness, or fluid from your nipples that is not normal. This information is not intended to replace advice given to you by your health care provider. Make sure you discuss any questions you have with your health care provider. Document Revised: 03/24/2018 Document Reviewed: 03/24/2018 Elsevier Patient Education  2020 Elsevier Inc.   Managing Anxiety, Adult After being diagnosed with an anxiety disorder, you may be relieved to know why you have felt or behaved a certain way. You may also feel overwhelmed about the treatment ahead and what it will mean for your life. With care and support, you can manage this condition and recover from it. How to manage lifestyle changes Managing stress and anxiety  Stress is your body's reaction to life changes and events, both good and bad. Most stress will last just a few hours, but stress can be ongoing and can lead to more than just stress. Although stress can play a major role in anxiety, it is not the same as anxiety. Stress is usually caused by something external, such as a deadline, test, or competition. Stress normally passes after the triggering event has ended.  Anxiety is caused by something internal, such as imagining a terrible outcome or worrying that something will go wrong that will devastate you. Anxiety often does not go away even after the triggering event is over, and  it can become long-term (chronic) worry. It is important to understand the differences between stress and anxiety and to manage your stress effectively so that it does not lead to an anxious response. Talk with your health care provider or a counselor to learn more about reducing anxiety and stress. He or she may suggest tension reduction techniques, such as: Music therapy. This can include creating or listening to music that you enjoy and that inspires you. Mindfulness-based meditation. This involves being aware of your normal breaths while not trying to control your breathing. It can be done while sitting or walking. Centering prayer. This involves focusing on a word, phrase, or sacred image that means something to you and brings you peace. Deep breathing. To do this, expand your stomach and inhale slowly through your nose. Hold your breath for 3-5 seconds. Then exhale slowly, letting your stomach muscles relax. Self-talk. This involves identifying thought patterns that lead to anxiety reactions and changing those patterns. Muscle relaxation. This involves tensing muscles and then relaxing them. Choose a tension reduction technique that suits your lifestyle and personality. These techniques take time and practice. Set aside 5-15 minutes a day to do them. Therapists can offer counseling and training in these techniques. The training to help with anxiety may be covered by some insurance plans. Other things you can do to manage stress and anxiety include: Keeping a stress/anxiety diary. This can help you learn what triggers your reaction and then learn ways to manage your response. Thinking about how you react to certain situations. You may not be able to control everything, but you can control your response. Making time for activities that help you relax and not feeling guilty about spending your time in this way. Visual imagery and yoga can help you stay calm and relax.  Medicines Medicines can help  ease symptoms. Medicines for anxiety include:  Anti-anxiety drugs. Antidepressants. Medicines are often used as a primary treatment for anxiety disorder. Medicines will be prescribed by a health care provider. When used together, medicines, psychotherapy, and tension reduction techniques may be the most effective treatment. Relationships Relationships can play a big part in helping you recover. Try to spend more time connecting with trusted friends and family members. Consider going to couples counseling, taking family education classes, or going to family therapy. Therapy can help you and others better understand your condition. How to recognize changes in your anxiety Everyone responds differently to treatment for anxiety. Recovery from anxiety happens when symptoms decrease and stop interfering with your daily activities at home or work. This may mean that you will start to: Have better concentration and focus. Worry will interfere less in your daily thinking. Sleep better. Be less irritable. Have more energy. Have improved memory. It is important to recognize when your condition is getting worse. Contact your health care provider if your symptoms interfere with home or work and you feel like your condition is not improving. Follow these instructions at home: Activity Exercise. Most adults should do the following: Exercise for at least 150 minutes each week. The exercise should increase your heart rate and make you sweat (moderate-intensity exercise). Strengthening exercises at least twice a week. Get the right amount and quality of sleep. Most adults need 7-9 hours of sleep each night. Lifestyle  Eat a healthy diet that includes plenty of vegetables, fruits, whole grains, low-fat dairy products, and lean protein. Do not eat a lot of foods that are high in solid fats, added sugars, or salt. Make choices that simplify your life. Do not use any products that contain nicotine or tobacco, such  as cigarettes, e-cigarettes, and chewing tobacco. If you need help quitting, ask your health care provider. Avoid caffeine, alcohol, and certain over-the-counter cold medicines. These may make you feel worse. Ask your pharmacist which medicines to avoid. General instructions Take over-the-counter and prescription medicines only as told by your health care provider. Keep all follow-up visits as told by your health care provider. This is important. Where to find support You can get help and support from these sources: Self-help groups. Online and [employer]. A trusted spiritual leader. Couples counseling. Family education classes. Family therapy. Where to find more information You may find that joining a support group helps you deal with your anxiety. The following sources can help you locate counselors or support groups near you: Mental Health America: www.mentalhealthamerica.net Anxiety and Depression Association of [place] (ADAA): ProgramCam.de [employer] on Mental Illness (NAMI): www.nami.org Contact a health care provider if you: Have a hard time staying focused or finishing daily tasks. Spend many hours a day feeling worried about everyday life. Become exhausted by worry. Start to have headaches, feel tense, or have nausea. Urinate more than normal. Have diarrhea. Get help right away if you have: A racing heart and shortness of breath. Thoughts of hurting yourself or others. If you ever feel like you may hurt yourself or others, or have thoughts about taking your own life, get help right away. You can go to your nearest emergency department or call: Your local emergency services (911 in the [place] A suicide crisis helpline, such as the National Suicide Prevention Lifeline at 442-578-6247. This is open 24 hours a day. Summary Taking steps to learn and use tension reduction techniques can help calm you and help prevent triggering an anxiety reaction. When used  together, medicines, psychotherapy, and tension reduction techniques  may be the most effective treatment. Family, friends, and partners can play a big part in helping you recover from an anxiety disorder. This information is not intended to replace advice given to you by your health care provider. Make sure you discuss any questions you have with your health care provider. Document Revised: 01/05/2019 Document Reviewed: 01/05/2019 Elsevier Patient Education  2020 [employer].   Constipation, Adult Constipation is when a person:  Poops (has a bowel movement) fewer times in a week than normal.  Has a hard time pooping.  Has poop that is dry, hard, or bigger than normal. Follow these instructions at home: Eating and drinking   Eat foods that have a lot of fiber, such as: ? Fresh fruits and vegetables. ? Whole grains. ? Beans.  Eat less of foods that are high in fat, low in fiber, or overly processed, such as: ? [place] fries. ? Hamburgers. ? Cookies. ? Candy. ? Soda.  Drink enough fluid to keep your pee (urine) clear or pale yellow. General instructions  Exercise regularly or as told by your doctor.  Go to the restroom when you feel like you need to poop. Do not hold it in.  Take over-the-counter and prescription medicines only as told by your doctor. These include any fiber supplements.  Do pelvic floor retraining exercises, such as: ? Doing deep breathing while relaxing your lower belly (abdomen). ? Relaxing your pelvic floor while pooping.  Watch your condition for any changes.  Keep all follow-up visits as told by your doctor. This is important. Contact a doctor if:  You have pain that gets worse.  You have a fever.  You have not pooped for 4 days.  You throw up (vomit).  You are not hungry.  You lose weight.  You are bleeding from the anus.  You have thin, pencil-like poop (stool). Get help right away if:  You have a fever, and your symptoms  suddenly get worse.  You leak poop or have blood in your poop.  Your belly feels hard or bigger than normal (is bloated).  You have very bad belly pain.  You feel dizzy or you faint. This information is not intended to replace advice given to you by your health care provider. Make sure you discuss any questions you have with your health care provider. Document Revised: 07/18/2017 Document Reviewed: 01/24/2016 Elsevier Patient Education  2020 Elsevier Inc.   [employer] Content in Foods  See the following list for the dietary fiber content of some common foods. High-fiber foods High-fiber foods contain 4 grams or more (4g or more) of fiber per serving. They include:  Artichoke (fresh) -- 1 medium has 10.3g of fiber.  Baked beans, plain or vegetarian (canned) --  cup has 5.2g of fiber.  Blackberries or raspberries (fresh) --  cup has 4g of fiber.  Bran cereal --  cup has 8.6g of fiber.  Bulgur (cooked) --  cup has 4g of fiber.  Kidney beans (canned) --  cup has 6.8g of fiber.  Lentils (cooked) --  cup has 7.8g of fiber.  Pear (fresh) -- 1 medium has 5.1g of fiber.  Peas (frozen) --  cup has 4.4g of fiber.  Pinto beans (canned) --  cup has 5.5g of fiber.  Pinto beans (dried and cooked) --  cup has 7.7g of fiber.  Potato with skin (baked) -- 1 medium has 4.4g of fiber.  Quinoa (cooked) --  cup has 5g of fiber.  Soybeans (canned, frozen, or fresh) --  cup has 5.1g of fiber. Moderate-fiber foods Moderate-fiber foods contain 1-4 grams (1-4g) of fiber per serving. They include:  Almonds -- 1 oz. has 3.5g of fiber.  Apple with skin -- 1 medium has 3.3g of fiber.  Applesauce, sweetened --  cup has 1.5g of fiber.  Bagel, plain -- one 4-inch (10-cm) bagel has 2g of fiber.  Banana -- 1 medium has 3.1g of fiber.  Broccoli (cooked) --  cup has 2.5g of fiber.  Carrots (cooked) --  cup has 2.3g of fiber.  Corn (canned or frozen) --  cup has 2.1g of  fiber.  Corn tortilla -- one 6-inch (15-cm) tortilla has 1.5g of fiber.  Green beans (canned) --  cup has 2g of fiber.  Instant oatmeal --  cup has about 2g of fiber.  Long-grain brown rice (cooked) -- 1 cup has 3.5g of fiber.  Macaroni, enriched (cooked) -- 1 cup has 2.5g of fiber.  Melon -- 1 cup has 1.4g of fiber.  Multigrain cereal --  cup has about 2-4g of fiber.  Orange -- 1 small has 3.1g of fiber.  Potatoes, mashed --  cup has 1.6g of fiber.  Raisins -- 1/4 cup has 1.6g of fiber.  Squash --  cup has 2.9g of fiber.  Sunflower seeds --  cup has 1.1g of fiber.  Tomato -- 1 medium has 1.5g of fiber.  Vegetable or soy patty -- 1 has 3.4g of fiber.  Whole-wheat bread -- 1 slice has 2g of fiber.  Whole-wheat spaghetti --  cup has 3.2g of fiber. Low-fiber foods Low-fiber foods contain less than 1 gram (less than 1g) of fiber per serving. They include:  Egg -- 1 large.  Flour tortilla -- one 6-inch (15-cm) tortilla.  Fruit juice --  cup.  Lettuce -- 1 cup.  Meat, poultry, or fish -- 1 oz.  Milk -- 1 cup.  Spinach (raw) -- 1 cup.  White bread -- 1 slice.  White rice --  cup.  Yogurt --  cup. Actual amounts of fiber in foods may be different depending on processing. Talk with your dietitian about how much fiber you need in your diet. This information is not intended to replace advice given to you by your health care provider. Make sure you discuss any questions you have with your health care provider. Document Revised: 03/28/2016 Document Reviewed: 09/28/2015 Elsevier Patient Education  2020 Elsevier Inc.  Polyethylene Glycol powder What is this medicine POLYETHYLENE GLYCOL 3350 (pol ee ETH i leen; GLYE col) powder is a laxative used to treat constipation. It increases the amount of water in the stool. Bowel movements become easier and more frequent. This medicine may be used for other purposes; ask your health care provider or pharmacist if you  have questions. COMMON BRAND NAME(S): Tish Men, GlycoLax, Healthylax, MiraLax, Smooth LAX, [facility] What should I tell my health care provider before I take this medicine? They need to know if you have any of these conditions:  a history of blockage of the stomach or intestine  current abdomen distension or pain  difficulty swallowing  diverticulitis, ulcerative colitis, or other chronic bowel disease  phenylketonuria  an unusual or allergic reaction to polyethylene glycol, other medicines, dyes, or preservatives  pregnant or trying to get pregnant  breast-feeding How should I use this medicine? Take this medicine by mouth. The bottle has a measuring cap that is marked with a line. Pour the powder into the cap up to the marked line (the dose is  about 1 heaping tablespoon). Add the powder in the cap to a full glass (4 to 8 ounces or 120 to 240 mL) of water, juice, soda, coffee or tea. Mix the powder well. Ensure that the powder is fully dissolved. Do not drink if there are any clumps. Drink the solution. Take exactly as directed. Do not take your medicine more often than directed. Talk to your pediatrician regarding the use of this medicine in children. Special care may be needed. Overdosage: If you think you have taken too much of this medicine contact a poison control center or emergency room at once. NOTE: This medicine is only for you. Do not share this medicine with others. What if I miss a dose? If you miss a dose, take it as soon as you can. If it is almost time for your next dose, take only that dose. Do not take double or extra doses. What may interact with this medicine? Interactions are not expected. This list may not describe all possible interactions. Give your health care provider a list of all the medicines, herbs, non-prescription drugs, or dietary supplements you use. Also tell them if you smoke, drink alcohol, or use illegal drugs. Some items may interact with  your medicine. What should I watch for while using this medicine? Do not use for more than 2 weeks without advice from your doctor or health care professional. It can take 2 to 4 days to have a bowel movement and to experience improvement in constipation. See your health care professional for any changes in bowel habits, including constipation, that are severe or last longer than three weeks. Always take this medicine with plenty of water. What side effects may I notice from receiving this medicine? Side effects that you should report to your doctor or health care professional as soon as possible:  diarrhea  difficulty breathing  itching of the skin, hives, or skin rash  severe bloating, pain, or distension of the stomach  vomiting Side effects that usually do not require medical attention (report to your doctor or health care professional if they continue or are bothersome):  bloating or gas  lower abdominal discomfort or cramps  nausea This list may not describe all possible side effects. Call your doctor for medical advice about side effects. You may report side effects to FDA at 1-800-FDA-1088. Where should I keep my medicine? Keep out of the reach of children. Store between 15 and 30 degrees C (59 and 86 degrees F). Throw away any unused medicine after the expiration date. NOTE: This sheet is a summary. It may not cover all possible information. If you have questions about this medicine, talk to your doctor, pharmacist, or health care provider.  2020 Elsevier/Gold Standard (2018-01-22 10:42:01)   Psoriasis Psoriasis is a long-term (chronic) skin condition. It occurs because your body's defense system (immune system) causes skin cells to form too quickly. This causes raised, red patches (plaques) on your skin that look silvery. The patches may be on all areas of your body. They can be any size or shape. Psoriasis can come and go. It can range from mild to very bad. It cannot be  passed from one person to another (is not contagious). There is no cure for this condition, but it can be helped with treatment. What are the causes? The cause of psoriasis is not known. Some things can make it worse. These are:  Skin damage, such as cuts, scrapes, sunburn, and dryness.  Not getting enough sunlight.  Some medicines.  Alcohol.  Tobacco.  Stress.  Infections. What increases the risk?  Having a family member with psoriasis.  Being very overweight (obese).  Being 3820-25 years old.  Taking certain medicines. What are the signs or symptoms? There are different types of psoriasis. The types are:  Plaque. This is the most common. Symptoms include red, raised patches with a silvery coating. These may be itchy. Your nails may be crumbly or fall off.  Guttate. Symptoms include small red spots on your stomach area, arms, and legs. These may happen after you have been sick, such as with strep throat.  Inverse. Symptoms include patches in your armpits, under your breasts, private areas, or on your butt.  Pustular. Symptoms include pus-filled bumps on the palms of your hands or the soles of your feet. You also may feel very tired, weak, have a fever, and not be hungry.  Erythrodermic. Symptoms include bright red skin that looks burned. You may have a fast heartbeat and a body temperature that is too high or too low. You may be itchy or in pain.  Sebopsoriasis. Symptoms include red patches on your scalp, forehead, and face that are greasy.  Psoriatic arthritis. Symptoms include swollen, painful joints along with scaly skin patches. How is this treated? There is no cure for this condition, but treatment can:  Help your skin heal.  Lessen itching and irritation and swelling (inflammation).  Slow the growth of new skin cells.  Help your body's defense system respond better to your skin. Treatment may include:  Creams or ointments.  Light therapy. This may include  natural sunlight or light therapy in a doctor's office.  Medicines. These can help your body better manage skin cells. They may be used with light therapy or ointments. Medicines may include pills or injections. You may also get antibiotic medicines if you have an infection. Follow these instructions at home: Skin Care  Apply lotion to your skin as needed. Only use those that your doctor has said are okay.  Apply cool, wet cloths (cold compresses) to the affected areas.  Do not use a hot tub or take hot showers. Use slightly warm, not hot, water when taking showers and baths.  Do not scratch your skin. Lifestyle   Do not use any products that contain nicotine or tobacco, such as cigarettes, e-cigarettes, and chewing tobacco. If you need help quitting, ask your doctor.  Lower your stress.  Keep a healthy weight.  Go out in the sun as told by your doctor. Do not get sunburned.  Join a support group. Medicines  Take or use over-the-counter and prescription medicines only as told by your doctor.  If you were prescribed an antibiotic medicine, take it as told by your doctor. Do not stop using the antibiotic even if you start to feel better. Alcohol use If you drink alcohol:  Limit how much you use: ? 0-1 drink a day for women. ? 0-2 drinks a day for men.  Be aware of how much alcohol is in your drink. In the [place] one drink equals one 12 oz bottle of beer (355 mL), one 5 oz glass of wine (148 mL), or one 1 oz glass of hard liquor (44 mL). General instructions  Keep a journal to track the things that cause symptoms (triggers). Try to avoid these things.  See a counselor if you feel the support would help.  Keep all follow-up visits as told by your doctor. This is important. Contact a  doctor if:  You have a fever.  Your pain gets worse.  You have more redness or warmth in the affected areas.  You have new or worse pain or stiffness in your joints.  Your nails start to  break easily or pull away from the nail bed.  You feel very sad (depressed). Summary  Psoriasis is a long-term (chronic) skin condition.  There is no cure for this condition, but treatment can help manage it.  Keep a journal to track the things that cause symptoms.  Take or use over-the-counter and prescription medicines only as told by your doctor.  Keep all follow-up visits as told by your doctor. This is important. This information is not intended to replace advice given to you by your health care provider. Make sure you discuss any questions you have with your health care provider. Document Revised: 06/09/2018 Document Reviewed: 06/09/2018 Elsevier Patient Education  2020 [employer].    https://www.cdc.gov/vaccines/hcp/vis/vis-statements/tdap.pdf">  Tdap (Tetanus, Diphtheria, Pertussis) Vaccine: What You Need to Know 1. Why get vaccinated? Tdap vaccine can prevent tetanus, diphtheria, and pertussis. Diphtheria and pertussis spread from person to person. Tetanus enters the body through cuts or wounds.  TETANUS (T) causes painful stiffening of the muscles. Tetanus can lead to serious health problems, including being unable to open the mouth, having trouble swallowing and breathing, or death.  DIPHTHERIA (D) can lead to difficulty breathing, heart failure, paralysis, or death.  PERTUSSIS (aP), also known as "whooping cough," can cause uncontrollable, violent coughing which makes it hard to breathe, eat, or drink. Pertussis can be extremely serious in babies and young children, causing pneumonia, convulsions, brain damage, or death. In teens and adults, it can cause weight loss, loss of bladder control, passing out, and rib fractures from severe coughing. 2. Tdap vaccine Tdap is only for children 7 years and older, adolescents, and adults.  Adolescents should receive a single dose of Tdap, preferably at age 74 or 12 years. Pregnant women should get a dose of Tdap during every  pregnancy, to protect the newborn from pertussis. Infants are most at risk for severe, life-threatening complications from pertussis. Adults who have never received Tdap should get a dose of Tdap. Also, adults should receive a booster dose every 10 years, or earlier in the case of a severe and dirty wound or burn. Booster doses can be either Tdap or Td (a different vaccine that protects against tetanus and diphtheria but not pertussis). Tdap may be given at the same time as other vaccines. 3. Talk with your health care provider Tell your vaccine provider if the person getting the vaccine:  Has had an allergic reaction after a previous dose of any vaccine that protects against tetanus, diphtheria, or pertussis, or has any severe, life-threatening allergies.  Has had a coma, decreased level of consciousness, or prolonged seizures within 7 days after a previous dose of any pertussis vaccine (DTP, DTaP, or Tdap).  Has seizures or another nervous system problem.  Has ever had Guillain-Barr Syndrome (also called GBS).  Has had severe pain or swelling after a previous dose of any vaccine that protects against tetanus or diphtheria. In some cases, your health care provider may decide to postpone Tdap vaccination to a future visit.  People with minor illnesses, such as a cold, may be vaccinated. People who are moderately or severely ill should usually wait until they recover before getting Tdap vaccine.  Your health care provider can give you more information. 4. Risks of a vaccine reaction  Pain, redness, or swelling where the shot was given, mild fever, headache, feeling tired, and nausea, vomiting, diarrhea, or stomachache sometimes happen after Tdap vaccine. People sometimes faint after medical procedures, including vaccination. Tell your provider if you feel dizzy or have vision changes or ringing in the ears.  As with any medicine, there is a very remote chance of a vaccine causing a severe  allergic reaction, other serious injury, or death. 5. What if there is a serious problem? An allergic reaction could occur after the vaccinated person leaves the clinic. If you see signs of a severe allergic reaction (hives, swelling of the face and throat, difficulty breathing, a fast heartbeat, dizziness, or weakness), call 9-1-1 and get the person to the nearest hospital. For other signs that concern you, call your health care provider.  Adverse reactions should be reported to the Vaccine Adverse Event Reporting System (VAERS). Your health care provider will usually file this report, or you can do it yourself. Visit the VAERS website at www.vaers.LAgents.no or call 248-083-8146. VAERS is only for reporting reactions, and VAERS staff do not give medical advice. 6. The National Vaccine Injury Compensation Program The [employer] Vaccine Injury Compensation Program (VICP) is a federal program that was created to compensate people who may have been injured by certain vaccines. Visit the VICP website at SpiritualWord.at or call (684)849-2385 to learn about the program and about filing a claim. There is a time limit to file a claim for compensation. 7. How can I learn more?  Ask your health care provider.  Call your local or state health department.  Contact the Centers for Disease Control and Prevention (CDC): ? Call 402 504 8621 (1-800-CDC-INFO) or ? Visit CDC's website at PicCapture.uy Vaccine Information Statement Tdap (Tetanus, Diphtheria, Pertussis) Vaccine (11/18/2018) This information is not intended to replace advice given to you by your health care provider. Make sure you discuss any questions you have with your health care provider. Document Revised: 11/27/2018 Document Reviewed: 11/30/2018 Elsevier Patient Education  2020 [employer].

## 2020-03-30 NOTE — Progress Notes (Signed)
New patient visit   Patient: Debra [name]   DOB: 1996/07/07   24 y.o. Female  MRN: [id number] Visit Date: 03/30/2020  Today's healthcare provider: Shirlee Latch, MD  Marcha Dutton as a scribe for Shirlee Latch, MD.,have documented all relevant documentation on the behalf of Shirlee Latch, MD,as directed by  Shirlee Latch, MD while in the presence of Shirlee Latch, MD. Chief Complaint  Patient presents with  . New Patient (Initial Visit)   Subjective     HPI  Debra [name] is a 24 y.o. female who presents today as a new patient to establish care, she comes to our office from [facility]  She reports consuming a general diet. 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 She reports sleeping fairly well. She does have additional problems to discuss today. Patient states that she would like to discuss G.I concern, she reports history of constipation and having to use laxatives.Patient reports that she passes a bowel movement every other day that she describes as small and hard.  Bristol stool chart type I or II.   patient also would like to address dry patch on scalp of her head that she states has been present for over 30days and describes it as "very itchy".  She tried some steroid cream on it that her mother-in-law had and it seemed to help it.  She has no joint effusions, stiffness, pain, or rash elsewhere.  Last Reported Pap: 03/27/2020 , Normal.   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  She is followed by endocrinology in [place].  Denies any symptoms currently.    Past Medical History:  Diagnosis Date  . Allergy   . Anxiety   . Depression   . History of irregular menstrual cycles   . Irregular menses   . Thyroid disease    Past Surgical History:  Procedure Laterality  Date  . TONSILLECTOMY AND ADENOIDECTOMY  2012  . TOTAL THYROIDECTOMY     Family Status  Relation Name Status  . Other pat GGM Alive  . PGF  Alive  . MGF  Alive  . Father  (Not Specified)  . PGM  (Not Specified)   Family History  Problem Relation Age of Onset  . Thyroid disease Other   . Heart Problems Paternal Grandfather        CABG x4  . Hypertension Maternal Grandfather   . Hypertension Father   . Gout Father   . Arthritis Paternal Grandmother   . Gout Paternal Grandmother   . Hypertension Paternal Grandmother    Social History   Socioeconomic History  . Marital status: Married    Spouse name: Not on file  . Number of children: 0  . Years of education: Not on file  . Highest education level: Not on file  Occupational History  . Occupation: Scientist, product/process development  . Occupation: Engineering geologist of Dance  Tobacco Use  . Smoking status: Never Smoker  . Smokeless tobacco: Never Used  Vaping Use  . Vaping Use: Never used  Substance and Sexual Activity  . Alcohol use: Not Currently    Comment: rarely  . Drug use: No  . Sexual activity: Yes    Partners: Male    Birth control/protection: Pill  Other Topics Concern  . Not on file  Social History Narrative  . Not on file   Social Determinants  of Health   Financial Resource Strain:   . Difficulty of Paying Living Expenses:   Food Insecurity:   . Worried About Programme researcher, broadcasting/film/videounning Out of Food in the Last Year:   . Baristaan Out of Food in the Last Year:   Transportation Needs:   . Freight forwarderLack of Transportation (Medical):   Marland Kitchen. Lack of Transportation (Non-Medical):   Physical Activity:   . Days of Exercise per Week:   . Minutes of Exercise per Session:   Stress:   . Feeling of Stress :   Social Connections:   . Frequency of Communication with Friends and Family:   . Frequency of Social Gatherings with Friends and Family:   . Attends Religious Services:   . Active Member of Clubs or Organizations:   . Attends BankerClub or Organization Meetings:    Marland Kitchen. Marital Status:    Outpatient Medications Prior to Visit  Medication Sig  . busPIRone (BUSPAR) 7.5 MG tablet Take 1 tablet (7.5 mg total) by mouth 3 (three) times daily.  . drospirenone-ethinyl estradiol (YAZ) 3-0.02 MG tablet Take 1 tablet by mouth daily.  Marland Kitchen. levothyroxine (SYNTHROID) 112 MCG tablet TAKE 1 TABLET (112 MCG TOTAL) BY MOUTH DAILY BEFORE BREAKFAST   No facility-administered medications prior to visit.   Allergies  Allergen Reactions  . Sulfa Antibiotics Rash    Immunization History  Administered Date(s) Administered  . Influenza,inj,Quad PF,6+ Mos 07/30/2018  . Tdap 03/30/2020    Health Maintenance  Topic Date Due  . Hepatitis C Screening  Never done  . HIV Screening  Never done  . INFLUENZA VACCINE  03/19/2020  . PAP-Cervical Cytology Screening  03/28/2023  . PAP SMEAR-Modifier  03/28/2023  . TETANUS/TDAP  03/30/2030    Patient Care Team: Erasmo DownerBacigalupo, Uriel Horkey M, MD as PCP - General (Family Medicine)  Review of Systems  Cardiovascular: Positive for palpitations.  Gastrointestinal: Positive for constipation.  Endocrine: Positive for polyuria.  Skin: Positive for rash.  Neurological: Positive for dizziness and headaches.  All other systems reviewed and are negative.     Objective    BP 110/76   Pulse 67   Temp 98.4 F (36.9 C) (Oral)   Resp 16   Ht 5\' 9"  (1.753 m)   Wt 164 lb 3.2 oz (74.5 kg)   SpO2 98%   BMI 24.25 kg/m  Physical Exam Vitals reviewed.  Constitutional:      Appearance: Normal appearance. She is normal weight.  HENT:     Head: Normocephalic and atraumatic.     Right Ear: External ear normal.     Left Ear: External ear normal.  Eyes:     Extraocular Movements: Extraocular movements intact.     Conjunctiva/sclera: Conjunctivae normal.  Cardiovascular:     Rate and Rhythm: Normal rate and regular rhythm.     Pulses: Normal pulses.     Heart sounds: Normal heart sounds.  Pulmonary:     Effort: Pulmonary effort is normal.  No respiratory distress.     Breath sounds: Normal breath sounds. No stridor. No wheezing, rhonchi or rales.  Chest:     Chest wall: No tenderness.     Breasts: Tanner Score is 5.        Right: Normal. No inverted nipple, nipple discharge, skin change or tenderness.        Left: Normal. No inverted nipple, nipple discharge, skin change or tenderness.  Abdominal:     General: There is no distension.     Palpations: Abdomen is soft.  Tenderness: There is no abdominal tenderness.  Genitourinary:    Vagina: Normal.     Cervix: Normal.     Uterus: Normal.      Adnexa: Right adnexa normal.     Rectum: No external hemorrhoid.  Musculoskeletal:        General: Normal range of motion.     Cervical back: Normal range of motion and neck supple.  Lymphadenopathy:     Upper Body:     Right upper body: No supraclavicular, axillary or pectoral adenopathy.     Left upper body: No supraclavicular, axillary or pectoral adenopathy.  Skin:    General: Skin is dry.     Findings: Rash present. Rash is scaling.          Comments: Dry flaky skin patches seen on the left side of scalp near nape of neck.  Neurological:     Mental Status: She is alert and oriented to person, place, and time.     Motor: No weakness.     Gait: Gait normal.  Psychiatric:        Attention and Perception: Attention normal.        Mood and Affect: Mood normal.        Speech: Speech normal.        Behavior: Behavior normal. Behavior is cooperative.        Thought Content: Thought content normal.        Cognition and Memory: Cognition normal.        Judgment: Judgment normal.     Depression Screen PHQ 2/9 Scores 03/30/2020 03/27/2020  PHQ - 2 Score 1 2  PHQ- 9 Score 9 13   No results found for any visits on 03/30/20.  Assessment & Plan      Problem List Items Addressed This Visit      Digestive   Chronic constipation - Primary    Longstanding and multifactorial issue Patient reports that she is shy of going  to the bathroom around anyone else and will go multiple days of holding, especially on vacation She has Bristol type I or II stools consistently and does not have a bowel movement every day Discussed the importance of having soft regular bowel movements Treatment of her anxiety may help some with her hesitance to use the bathroom around other people Also encourage therapy for this along with her anxiety Discussed increasing fiber in the diet versus even using a fiber supplement Start treatment with MiraLAX and titrate to 1 soft bowel movement daily        Endocrine   Postoperative hypothyroidism    Well-controlled well-controlled and asymptomatic Followed by endocrinology in [place] Reviewed last TSH and free T4 Continue current dose of Synthroid        Musculoskeletal and Integument   Psoriasis of scalp    New diagnosis  lesion on scalp is consistent with plaque psoriasis with silver scale Discussed chronic nature of psoriasis and attempts to manage it Discussed that it is possible to get lesions elsewhere on the body as well No joint symptoms suggestive of psoriatic arthritis Start clobetasol solution twice daily      Relevant Medications   clobetasol (TEMOVATE) 0.05 % external solution     Other   H/O Graves' disease (Chronic)    Followed by endocrinology -see plan above for postsurgical hypothyroidism      S/P total thyroidectomy    Followed by endocrinology      GAD (generalized anxiety disorder)    Previously  diagnosed and treated by GYN She is tolerating Buspar well and finds that it is helpful 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     I, Shirlee Latch, MD, have reviewed all documentation for this visit. The documentation on 03/31/20 for the exam, diagnosis, procedures, and orders are all accurate and complete.   Cher Egnor, Marzella Schlein, MD, MPH [facility]

## 2020-03-31 DIAGNOSIS — F411 Generalized anxiety disorder: Secondary | ICD-10-CM | POA: Insufficient documentation

## 2020-03-31 DIAGNOSIS — K5909 Other constipation: Secondary | ICD-10-CM | POA: Insufficient documentation

## 2020-03-31 DIAGNOSIS — L409 Psoriasis, unspecified: Secondary | ICD-10-CM | POA: Insufficient documentation

## 2020-03-31 NOTE — Assessment & Plan Note (Signed)
Well-controlled well-controlled and asymptomatic Followed by endocrinology in [place] Reviewed last TSH and free T4 Continue current dose of Synthroid

## 2020-03-31 NOTE — Assessment & Plan Note (Signed)
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

## 2020-03-31 NOTE — Assessment & Plan Note (Signed)
Followed by endocrinology 

## 2020-03-31 NOTE — Assessment & Plan Note (Signed)
Followed by endocrinology -see plan above for postsurgical hypothyroidism

## 2020-03-31 NOTE — Assessment & Plan Note (Signed)
Longstanding and multifactorial issue Patient reports that she is shy of going to the bathroom around anyone else and will go multiple days of holding, especially on vacation She has Bristol type I or II stools consistently and does not have a bowel movement every day Discussed the importance of having soft regular bowel movements Treatment of her anxiety may help some with her hesitance to use the bathroom around other people Also encourage therapy for this along with her anxiety Discussed increasing fiber in the diet versus even using a fiber supplement Start treatment with MiraLAX and titrate to 1 soft bowel movement daily

## 2020-03-31 NOTE — Assessment & Plan Note (Signed)
New diagnosis  lesion on scalp is consistent with plaque psoriasis with silver scale Discussed chronic nature of psoriasis and attempts to manage it Discussed that it is possible to get lesions elsewhere on the body as well No joint symptoms suggestive of psoriatic arthritis Start clobetasol solution twice daily

## 2020-05-30 ENCOUNTER — Other Ambulatory Visit: Payer: Self-pay

## 2020-05-30 ENCOUNTER — Encounter: Payer: Self-pay | Admitting: Family Medicine

## 2020-05-30 ENCOUNTER — Ambulatory Visit: Payer: BC Managed Care – PPO | Admitting: Family Medicine

## 2020-05-30 VITALS — BP 115/80 | HR 81 | Temp 98.8°F | Wt 169.0 lb

## 2020-05-30 DIAGNOSIS — F411 Generalized anxiety disorder: Secondary | ICD-10-CM

## 2020-05-30 DIAGNOSIS — L409 Psoriasis, unspecified: Secondary | ICD-10-CM | POA: Diagnosis not present

## 2020-05-30 DIAGNOSIS — E890 Postprocedural hypothyroidism: Secondary | ICD-10-CM | POA: Diagnosis not present

## 2020-05-30 MED ORDER — CITALOPRAM HYDROBROMIDE 20 MG PO TABS: 20.0000 mg | ORAL_TABLET | Freq: Every day | ORAL | 3 refills | Status: DC

## 2020-05-30 MED ORDER — CLOBETASOL PROPIONATE 0.05 % EX FOAM: Freq: Two times a day (BID) | CUTANEOUS | 5 refills | Status: DC

## 2020-05-30 NOTE — Assessment & Plan Note (Signed)
Previously diagnosed and treated by GYN She is tolerating BuSpar well, but no longer finds it helpful We will continue current dose of BuSpar while SSRI is being titrated Will Start Celexa twenty mg daily Discussed potential side effects, incl GI upset, sexual dysfunction, increased anxiety, and SI Discussed that it can take 6-8 weeks to reach full efficacy Contracted for safety - no SI/HI Discussed synergistic effects of medications and therapy  Follow-up in 4 weeks and repeat GAD-7 and PHQ-9

## 2020-05-30 NOTE — Assessment & Plan Note (Signed)
Was unable to fill clobetasol solution as it was not covered under her insurance We will try clobetasol foam instead

## 2020-05-30 NOTE — Patient Instructions (Signed)

## 2020-05-30 NOTE — Assessment & Plan Note (Signed)
Previous well-controlled and asymptomatic Concerned that new jitteriness and worsening anxiety could be related to overcorrection of her hypothyroidism Recheck TSH and consider changing dose of Synthroid Also followed by endocrinology in [place], so would keep them in the loop

## 2020-05-30 NOTE — Progress Notes (Signed)
Established patient visit   Patient: Debra [name]   DOB: 1995/11/28   24 y.o. Female  MRN: [id number] Visit Date: 05/30/2020  Today's healthcare provider: Shirlee Latch, MD   Chief Complaint  Patient presents with  . Anxiety   Subjective    Anxiety Presents for follow-up visit. Symptoms include decreased concentration, excessive worry, insomnia, nervous/anxious behavior and panic. Patient reports no confusion, dizziness or suicidal ideas. The quality of sleep is fair.     Significant worsening recently with hives, panic attacks, and jitteriness. No recent change in levothyroxine dose.  She increased her BuSpar to 3 times daily dosing without any improvement.  She has never taken an SSRI.  She is considering stopping birth control within the next year for conception and wants to take something that would be safe during pregnancy.   Patient Active Problem List   Diagnosis Date Noted  . Chronic constipation 03/31/2020  . 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  . GAD (generalized anxiety disorder) 03/31/2020  . Chest pain 03/17/2017  . Postoperative hypothyroidism 08/09/2016  . S/P total thyroidectomy 08/09/2016  . H/O Graves' disease 01/10/2015   Past Medical History:  Diagnosis Date  . Allergy   . Anxiety   . Depression   . History of irregular menstrual cycles   . Irregular menses   . Thyroid disease    Social History   Tobacco Use  . Smoking status: Never Smoker  . Smokeless tobacco: Never Used  Vaping Use  . Vaping Use: Never used  Substance Use Topics  . Alcohol use: Not Currently    Comment: rarely  . Drug use: No   Allergies  Allergen Reactions  . Sulfa Antibiotics Rash     Medications: Outpatient Medications Prior to Visit  Medication Sig  . busPIRone (BUSPAR) 7.5 MG tablet Take 1 tablet (7.5 mg total) by mouth 3 (three) times daily.  . drospirenone-ethinyl estradiol (YAZ) 3-0.02 MG tablet Take 1 tablet by mouth daily.  Marland Kitchen levothyroxine  (SYNTHROID) 112 MCG tablet TAKE 1 TABLET (112 MCG TOTAL) BY MOUTH DAILY BEFORE BREAKFAST  . [DISCONTINUED] clobetasol (TEMOVATE) 0.05 % external solution Apply 1 application topically 2 (two) times daily.   No facility-administered medications prior to visit.    Review of Systems  Constitutional: Negative.   Skin: Positive for rash.  Neurological: Negative for dizziness, light-headedness and headaches.  Psychiatric/Behavioral: Positive for decreased concentration. Negative for agitation, confusion, dysphoric mood, self-injury, sleep disturbance and suicidal ideas. The patient is nervous/anxious and has insomnia.     Last thyroid functions Lab Results  Component Value Date   TSH 1.33 12/14/2019   T3TOTAL 440 (H) 01/10/2015   T4TOTAL 8.9 12/31/2016      Objective    BP 115/80 (BP Location: Right Arm, Patient Position: Sitting, Cuff Size: Large)   Pulse 81   Temp 98.8 F (37.1 C) (Oral)   Wt 169 lb (76.7 kg)   BMI 24.96 kg/m    Physical Exam Vitals reviewed.  Constitutional:      General: She is not in acute distress.    Appearance: Normal appearance. She is well-developed. She is not diaphoretic.  HENT:     Head: Normocephalic and atraumatic.  Eyes:     General: No scleral icterus.    Conjunctiva/sclera: Conjunctivae normal.  Neck:     Thyroid: No thyromegaly.  Cardiovascular:     Rate and Rhythm: Normal rate and regular rhythm.     Pulses: Normal pulses.     Heart  sounds: Normal heart sounds. No murmur heard.   Pulmonary:     Effort: Pulmonary effort is normal. No respiratory distress.     Breath sounds: Normal breath sounds. No wheezing, rhonchi or rales.  Musculoskeletal:     Cervical back: Neck supple.     Right lower leg: No edema.     Left lower leg: No edema.  Lymphadenopathy:     Cervical: No cervical adenopathy.  Skin:    General: Skin is warm and dry.     Findings: No rash.  Neurological:     Mental Status: She is alert and oriented to person,  place, and time. Mental status is at baseline.  Psychiatric:        Attention and Perception: Attention normal.        Mood and Affect: Mood is anxious.        Speech: Speech normal.        Thought Content: Thought content does not include homicidal or suicidal ideation.        Judgment: Judgment is not impulsive or inappropriate.     Comments: Jittery       No results found for any visits on 05/30/20.  Assessment & Plan     Problem List Items Addressed This Visit      Endocrine   Postoperative hypothyroidism    Previous well-controlled and asymptomatic Concerned that new jitteriness and worsening anxiety could be related to overcorrection of her hypothyroidism Recheck TSH and consider changing dose of Synthroid Also followed by endocrinology in [place], so would keep them in the loop      Relevant Orders   TSH     Musculoskeletal and Integument   Psoriasis of scalp    Was unable to fill clobetasol solution as it was not covered under her insurance We will try clobetasol foam instead        Other   GAD (generalized anxiety disorder) - Primary    Previously diagnosed and treated by GYN She is tolerating BuSpar well, but no longer finds it helpful We will continue current dose of BuSpar while SSRI is being titrated Will Start Celexa twenty mg daily Discussed potential side effects, incl GI upset, sexual dysfunction, increased anxiety, and SI Discussed that it can take 6-8 weeks to reach full efficacy Contracted for safety - no SI/HI Discussed synergistic effects of medications and therapy  Follow-up in 4 weeks and repeat GAD-7 and PHQ-9      Relevant Medications   citalopram (CELEXA) 20 MG tablet   Other Relevant Orders   TSH   CBC w/Diff/Platelet   Comprehensive metabolic panel       Return in about 4 weeks (around 06/27/2020) for GAD f/u, as scheduled.      I, Shirlee Latch, MD, have reviewed all documentation for this visit. The documentation on  05/30/20 for the exam, diagnosis, procedures, and orders are all accurate and complete.   Weslie Rasmus, Marzella Schlein, MD, MPH [facility]

## 2020-05-31 LAB — CBC WITH DIFFERENTIAL/PLATELET
Basophils Absolute: 0.1 10*3/uL (ref 0.0–0.2)
Basos: 1 %
EOS (ABSOLUTE): 0.2 10*3/uL (ref 0.0–0.4)
Eos: 2 %
Hematocrit: 40.9 % (ref 34.0–46.6)
Hemoglobin: 13.5 g/dL (ref 11.1–15.9)
Immature Grans (Abs): 0 10*3/uL (ref 0.0–0.1)
Immature Granulocytes: 0 %
Lymphocytes Absolute: 2.7 10*3/uL (ref 0.7–3.1)
Lymphs: 29 %
MCH: 30 pg (ref 26.6–33.0)
MCHC: 33 g/dL (ref 31.5–35.7)
MCV: 91 fL (ref 79–97)
Monocytes Absolute: 0.5 10*3/uL (ref 0.1–0.9)
Monocytes: 6 %
Neutrophils Absolute: 5.8 10*3/uL (ref 1.4–7.0)
Neutrophils: 62 %
Platelets: 369 10*3/uL (ref 150–450)
RBC: 4.5 x10E6/uL (ref 3.77–5.28)
RDW: 12.1 % (ref 11.7–15.4)
WBC: 9.3 10*3/uL (ref 3.4–10.8)

## 2020-05-31 LAB — COMPREHENSIVE METABOLIC PANEL
ALT: 7 IU/L (ref 0–32)
AST: 13 IU/L (ref 0–40)
Albumin/Globulin Ratio: 2 (ref 1.2–2.2)
Albumin: 4.7 g/dL (ref 3.9–5.0)
Alkaline Phosphatase: 70 IU/L (ref 44–121)
BUN/Creatinine Ratio: 13 (ref 9–23)
BUN: 10 mg/dL (ref 6–20)
Bilirubin Total: 0.3 mg/dL (ref 0.0–1.2)
CO2: 26 mmol/L (ref 20–29)
Calcium: 9.8 mg/dL (ref 8.7–10.2)
Chloride: 101 mmol/L (ref 96–106)
Creatinine, Ser: 0.75 mg/dL (ref 0.57–1.00)
GFR calc Af Amer: 128 mL/min/{1.73_m2} (ref >59)
GFR calc non Af Amer: 111 mL/min/{1.73_m2} (ref >59)
Globulin, Total: 2.3 g/dL (ref 1.5–4.5)
Glucose: 81 mg/dL (ref 65–99)
Potassium: 4 mmol/L (ref 3.5–5.2)
Sodium: 140 mmol/L (ref 134–144)
Total Protein: 7 g/dL (ref 6.0–8.5)

## 2020-05-31 LAB — TSH: TSH: 1.66 u[IU]/mL (ref 0.450–4.500)

## 2020-06-30 ENCOUNTER — Ambulatory Visit: Payer: Self-pay | Admitting: Family Medicine

## 2020-07-10 ENCOUNTER — Ambulatory Visit: Payer: Self-pay | Admitting: Family Medicine

## 2020-07-18 ENCOUNTER — Encounter: Payer: Self-pay | Admitting: Family Medicine

## 2020-07-18 ENCOUNTER — Ambulatory Visit: Payer: BC Managed Care – PPO | Admitting: Family Medicine

## 2020-07-18 ENCOUNTER — Other Ambulatory Visit: Payer: Self-pay

## 2020-07-18 VITALS — BP 120/68 | HR 78 | Temp 98.3°F | Resp 16 | Wt 171.0 lb

## 2020-07-18 DIAGNOSIS — F411 Generalized anxiety disorder: Secondary | ICD-10-CM | POA: Diagnosis not present

## 2020-07-18 MED ORDER — CITALOPRAM HYDROBROMIDE 20 MG PO TABS: 20.0000 mg | ORAL_TABLET | Freq: Every day | ORAL | 3 refills | Status: DC

## 2020-07-18 NOTE — Assessment & Plan Note (Signed)
Longstanding and fairly well controlled Was not having improvement from BuSpar Discontinue BuSpar Much improved since starting Celexa Continue Celexa 20 mg daily Suspect GI upset will continue to improve Encourage therapy Contracted for safety-no SI/HI In the future, if having worsening anxiety, could increase Celexa dose to 40 mg daily Follow-up at CPE and repeat PHQ-9 and GAD-7

## 2020-07-18 NOTE — Progress Notes (Signed)
Established patient visit   Patient: Debra [name]   DOB: 06/29/1996   24 y.o. Female  MRN: [id number] Visit Date: 07/18/2020  Today's healthcare provider: Shirlee Latch, MD   Chief Complaint  Patient presents with  . Anxiety   Subjective    HPI  Anxiety, Follow-up  She was last seen for anxiety 3 months ago. Changes made at last visit include start Celexa 20 mg.   She reports excellent compliance with treatment. She reports fair tolerance of treatment. She is having side effects.   She feels her anxiety is moderate and Improved since last visit.  Symptoms: No chest pain No difficulty concentrating  No dizziness No fatigue  No feelings of losing control No insomnia  No irritable No palpitations  No panic attacks Yes racing thoughts  No shortness of breath No sweating  Yes tremors/shakes    GAD-7 Results GAD-7 Generalized Anxiety Disorder Screening Tool 07/18/2020 05/30/2020 03/27/2020  1. Feeling Nervous, Anxious, or on Edge 1 3 3   2. Not Being Able to Stop or Control Worrying 3 3 3   3. Worrying Too Much About Different Things 1 2 3   4. Trouble Relaxing 3 3 2   5. Being So Restless it's Hard To Sit Still 1 3 2   6. Becoming Easily Annoyed or Irritable 0 2 3  7. Feeling Afraid As If Something Awful Might Happen 1 3 3   Total GAD-7 Score 10 19 19   Difficulty At Work, Home, or Getting  Along With Others? Somewhat difficult Very difficult Extremely difficult    PHQ-9 Scores PHQ9 SCORE ONLY 07/18/2020 05/30/2020 03/30/2020  PHQ-9 Total Score 10 14 9     ---------------------------------------------------------------------------------------------------   Patient Active Problem List   Diagnosis Date Noted  . Chronic constipation 03/31/2020  . 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  . GAD (generalized anxiety disorder) 03/31/2020  . Chest pain 03/17/2017  . Postoperative hypothyroidism 08/09/2016  . S/P total thyroidectomy 08/09/2016  . H/O Graves' disease  01/10/2015   Social History   Tobacco Use  . Smoking status: Never Smoker  . Smokeless tobacco: Never Used  Vaping Use  . Vaping Use: Never used  Substance Use Topics  . Alcohol use: Not Currently    Comment: rarely  . Drug use: No   Allergies  Allergen Reactions  . Sulfa Antibiotics Rash       Medications: Outpatient Medications Prior to Visit  Medication Sig  . clobetasol (OLUX) 0.05 % topical foam Apply topically 2 (two) times daily.  . drospirenone-ethinyl estradiol (YAZ) 3-0.02 MG tablet Take 1 tablet by mouth daily.  04/02/2020 levothyroxine (SYNTHROID) 112 MCG tablet TAKE 1 TABLET (112 MCG TOTAL) BY MOUTH DAILY BEFORE BREAKFAST  . [DISCONTINUED] citalopram (CELEXA) 20 MG tablet Take 1 tablet (20 mg total) by mouth daily.  . [DISCONTINUED] busPIRone (BUSPAR) 7.5 MG tablet Take 1 tablet (7.5 mg total) by mouth 3 (three) times daily. (Patient not taking: Reported on 07/18/2020)   No facility-administered medications prior to visit.    Review of Systems  Constitutional: Negative.  Negative for appetite change.  Respiratory: Negative.   Cardiovascular: Negative.   Psychiatric/Behavioral: Positive for decreased concentration. The patient is nervous/anxious.      Last CBC Lab Results  Component Value Date   WBC 9.3 05/30/2020   HGB 13.5 05/30/2020   HCT 40.9 05/30/2020   MCV 91 05/30/2020   MCH 30.0 05/30/2020   RDW 12.1 05/30/2020   PLT 369 05/30/2020   Last thyroid functions Lab Results  Component Value Date   TSH 1.660 05/30/2020   T3TOTAL 440 (H) 01/10/2015   T4TOTAL 8.9 12/31/2016   Last vitamin B12 and Folate Lab Results  Component Value Date   VITAMINB12 283 12/14/2019      Objective    BP 120/68 (BP Location: Right Arm, Patient Position: Sitting, Cuff Size: Normal)   Pulse 78   Temp 98.3 F (36.8 C) (Oral)   Resp 16   Wt 171 lb (77.6 kg)   BMI 25.25 kg/m  BP Readings from Last 3 Encounters:  07/18/20 120/68  05/30/20 115/80  03/30/20 110/76     Wt Readings from Last 3 Encounters:  07/18/20 171 lb (77.6 kg)  05/30/20 169 lb (76.7 kg)  03/30/20 164 lb 3.2 oz (74.5 kg)      Physical Exam Vitals reviewed.  Constitutional:      General: She is not in acute distress.    Appearance: She is well-developed.  HENT:     Head: Normocephalic and atraumatic.  Eyes:     General: No scleral icterus.    Conjunctiva/sclera: Conjunctivae normal.  Cardiovascular:     Rate and Rhythm: Normal rate and regular rhythm.  Pulmonary:     Effort: Pulmonary effort is normal. No respiratory distress.  Skin:    General: Skin is warm and dry.     Findings: No rash.  Neurological:     Mental Status: She is alert and oriented to person, place, and time.  Psychiatric:        Mood and Affect: Mood normal.        Behavior: Behavior normal.      No results found for any visits on 07/18/20.  Assessment & Plan     Problem List Items Addressed This Visit      Other   GAD (generalized anxiety disorder) - Primary    Longstanding and fairly well controlled Was not having improvement from BuSpar Discontinue BuSpar Much improved since starting Celexa Continue Celexa 20 mg daily Suspect GI upset will continue to improve Encourage therapy Contracted for safety-no SI/HI In the future, if having worsening anxiety, could increase Celexa dose to 40 mg daily Follow-up at CPE and repeat PHQ-9 and GAD-7      Relevant Medications   citalopram (CELEXA) 20 MG tablet       Return in about 7 months (around 02/15/2021) for CPE, anxiety f/u.      I, Shirlee Latch, MD, have reviewed all documentation for this visit. The documentation on 07/18/20 for the exam, diagnosis, procedures, and orders are all accurate and complete.   Haeleigh Streiff, Marzella Schlein, MD, MPH [facility]

## 2020-08-19 ENCOUNTER — Other Ambulatory Visit: Payer: Self-pay | Admitting: Internal Medicine

## 2020-08-19 HISTORY — DX: Velamentous insertion of umbilical cord, unspecified trimester: O43.129

## 2020-11-03 ENCOUNTER — Other Ambulatory Visit: Payer: Self-pay | Admitting: Internal Medicine

## 2020-11-03 ENCOUNTER — Encounter: Payer: Self-pay | Admitting: Internal Medicine

## 2020-11-03 ENCOUNTER — Encounter: Payer: Self-pay | Admitting: Family Medicine

## 2020-11-03 DIAGNOSIS — E890 Postprocedural hypothyroidism: Secondary | ICD-10-CM

## 2020-12-12 ENCOUNTER — Other Ambulatory Visit: Payer: Self-pay

## 2020-12-12 ENCOUNTER — Encounter: Payer: Self-pay | Admitting: Internal Medicine

## 2020-12-12 ENCOUNTER — Ambulatory Visit (INDEPENDENT_AMBULATORY_CARE_PROVIDER_SITE_OTHER): Payer: BC Managed Care – PPO | Admitting: Internal Medicine

## 2020-12-12 VITALS — BP 128/88 | HR 80 | Ht 69.0 in | Wt 190.0 lb

## 2020-12-12 DIAGNOSIS — E890 Postprocedural hypothyroidism: Secondary | ICD-10-CM

## 2020-12-12 DIAGNOSIS — E559 Vitamin D deficiency, unspecified: Secondary | ICD-10-CM

## 2020-12-12 DIAGNOSIS — Z8639 Personal history of other endocrine, nutritional and metabolic disease: Secondary | ICD-10-CM

## 2020-12-12 DIAGNOSIS — E538 Deficiency of other specified B group vitamins: Secondary | ICD-10-CM

## 2020-12-12 NOTE — Progress Notes (Signed)
Patient ID: Debra [name], female   DOB: November 15, 1995, 25 y.o.   MRN: [id number]   This visit occurred during the SARS-CoV-2 public health emergency.  Safety protocols were in place, including screening questions prior to the visit, additional usage of staff PPE, and extensive cleaning of exam room while observing appropriate contact time as indicated for disinfecting solutions.   HPI  Debra [name] is a 25 y.o.-year-old female, returning for follow up for h/o uncontrolled Graves ds, now with postsurgical hypothyroidism. Last visit 1 year ago.  Interim history: At last visit, she complained of anxiety and also shortness of breath, chest pain, palpitations.  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  She feels much better on this and did not have any more panic attacks. She would like to get pregnant this year. She stopped Yaz in 09/2020.  She started prenatal vitamins. Her menstrual cycle is late.  She did gain some weight in the last month but she checked a pregnancy test few days ago and this was negative.  Reviewed history: She started to have mood swings and anxiety episodes in 2014.  She then developed heat intolerance, tremors, palpitations, fatigue.  Thyroid US, [facility] (12/30/14) - normal, except heterogeneity: Right thyroid lobe Measurements: 4.0 x 1.3 x 1.3 cm. No nodules visualized. Mildly heterogeneous tissue.  Left thyroid lobe Measurements: 4.3 x 1.0 x 1.3 cm. No nodules visualized. Mildly heterogeneous tissue.  Isthmus Thickness: 3 mm. No nodules visualized.  Lymphadenopathy: None visualized.  IMPRESSION: No evidence of thyroid nodule. Mildly heterogeneous glandular tissue. Normal size.  Thyroid Uptake and scan (01/13/2015): The thyroid scan demonstrates fairly symmetric and homogeneous uptake in the thyroid gland. No hot or cold nodules are demonstrated.  4 hour I 131 uptake = 51.2% (normal 5-20%)  24 hour I  131 uptake = 55.8% (normal 10-30%)  IMPRESSION: Elevated 4 and 24 hr iodine 123 uptake consistent with hyperthyroidism/Graves disease.  She started MMI 10 mg in am in 12/2014 >> we decreased the dose to 5 mg 2x a day, as her TFTs were improving in 02/2015, however, in 04/2015, her TFTs were still abnormal >> increased MMI to 10 mg 2x a day >>  20-10-20 mg >> 20 mg 3x a day in 12/2015.  Despite this, her TFTs remained abnormal.   She repeatedly refused RAI treatment and conventional thyroidectomy.  I then suggested transaxillary thyroidectomy, which she had at [facility] 07/29/2016 (Dr. Lovie Macadamia) >> she developed postop hypothyroidism.  Pt is on levothyroxine 112 mcg daily, taken: - in am - fasting - no b'fast - no Ca, Fe, PPIs - + prenatal vitamins at night - not on Biotin  Reviewed patient's TFTs normal: Lab Results  Component Value Date   TSH 1.660 05/30/2020   TSH 1.33 12/14/2019   TSH 0.52 12/15/2018   TSH 1.83 09/18/2017   TSH 3.37 03/17/2017   TSH 14.980 (H) 12/31/2016   TSH <0.006 (L) 05/30/2016   TSH 0.05 (L) 12/19/2015   TSH 0.12 (L) 10/25/2015   TSH 0.25 (L) 09/01/2015   FREET4 1.3 12/14/2019   FREET4 1.5 12/15/2018   FREET4 1.13 09/18/2017   FREET4 1.18 03/17/2017   FREET4 2.03 (H) 12/19/2015   FREET4 1.39 10/25/2015   FREET4 1.60 09/01/2015   FREET4 2.80 (H) 07/18/2015   FREET4 2.47 (H) 05/31/2015   FREET4 2.34 (H) 04/28/2015  07/22/2019: TSH 0.712 11/06/2016: TRAb 9.35 (0-1.75), TSH 20.15 01/10/2015: TSI 588 12/08/14: TSH 0.006, TT4 11.5, T3U  33%,FTI 3.8 TgAB<1 Remainder panel was normal for E2, Progesterone, PRL, testosterone  Pt denies: - feeling nodules in neck - hoarseness - dysphagia - choking - SOB with lying down  Pt does have a FH of thyroid ds. - hyper- or hypo-thyroidism in GGM. No FH of thyroid cancer. No h/o radiation tx to head or neck.  No herbal supplements. No Biotin use. No recent steroids use.   Vitamin D level was slightly low: Lab  Results  Component Value Date   VD25OH 25 (L) 12/14/2019  We started 2000 units vitamin D daily.  However, at this visit, she tells me that she is only taking a prenatal vitamin - with 400 units vitamin D daily.  She also had a slightly low vitamin B12: Lab Results  Component Value Date   VITAMINB12 283 12/14/2019  We started 1000 mcg vitamin B12 daily.  However, she is only taking 8 mcg B12 daily now in her prenatal vitamins.  She got married in 2019.   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  She started Celexa since last visit - in 09-05/2020 - for anxiety related to teaching. Stopped Buspar.  ROS: Constitutional: no weight gain/no weight loss, no fatigue, no subjective hyperthermia, no subjective hypothermia Eyes: no blurry vision, no xerophthalmia ENT: no sore throat, + see HPI Cardiovascular: no CP/no SOB/no palpitations/no leg swelling Respiratory: no cough/no SOB/no wheezing Gastrointestinal: no N/no V/no D/no C/no acid reflux Musculoskeletal: no muscle aches/no joint aches Skin: no rashes, no hair loss Neurological: no tremors/no numbness/no tingling/no dizziness, no HA  I reviewed pt's medications, allergies, PMH, social hx, family hx, and changes were documented in the history of present illness. Otherwise, unchanged from my initial visit note.  Past Medical History:  Diagnosis Date  . Allergy   . Anxiety   . Depression   . History of irregular menstrual cycles   . Irregular menses   . Thyroid disease    Past Surgical History:  Procedure Laterality Date  . TONSILLECTOMY AND ADENOIDECTOMY  2012  . TOTAL THYROIDECTOMY     History   Social History  . Marital Status: Single    Spouse Name: N/A  . Number of Children: 0   Occupational History  . student   Social History Main Topics  . Smoking status: Never Smoker   . Smokeless tobacco: Never Used  . Alcohol Use: Yes  . Drug Use: No   Current Outpatient Medications on File Prior to Visit  Medication  Sig Dispense Refill  . citalopram (CELEXA) 20 MG tablet Take 1 tablet (20 mg total) by mouth daily. 90 tablet 3  . clobetasol (OLUX) 0.05 % topical foam Apply topically 2 (two) times daily. 100 g 5  . levothyroxine (SYNTHROID) 112 MCG tablet TAKE 1 TABLET (112 MCG TOTAL) BY MOUTH DAILY BEFORE BREAKFAST 90 tablet 1   No current facility-administered medications on file prior to visit.   Allergies  Allergen Reactions  . Sulfa Antibiotics Rash   Family History  Problem Relation Age of Onset  . Thyroid disease Other   . Heart Problems Paternal Grandfather        CABG x4  . Hypertension Maternal Grandfather   . Hypertension Father   . Gout Father   . Arthritis Paternal Grandmother   . Gout Paternal Grandmother   . Hypertension Paternal Grandmother    PE: BP 128/88 (BP Location: Right Arm, Patient Position: Sitting, Cuff Size: Normal)   Pulse 80   Ht 5\' 9"  (1.753  m)   Wt 190 lb (86.2 kg)   SpO2 98%   BMI 28.06 kg/m  Body mass index is 28.06 kg/m. Wt Readings from Last 3 Encounters:  12/12/20 190 lb (86.2 kg)  07/18/20 171 lb (77.6 kg)  05/30/20 169 lb (76.7 kg)   Constitutional: overweight, in NAD Eyes: PERRLA, EOMI, no exophthalmos ENT: moist mucous membranes, no thyromegaly, no cervical lymphadenopathy Cardiovascular: RRR, No MRG Respiratory: CTA B Gastrointestinal: abdomen soft, NT, ND, BS+ Musculoskeletal: no deformities, strength intact in all 4 Skin: moist, warm, no rashes Neurological: no tremor with outstretched hands, DTR normal in all 4  ASSESSMENT: 1. H/o Graves ds  2. Postop hypothyroidism  3.  Vitamin D insufficiency  4.  Low vitamin B12  PLAN:  1. Patient with history of severe Graves' disease, previously not responding well to methimazole, now status post transaxillary thyroidectomy by Dr. Lovie Macadamia at [facility] -No signs of Graves' ophthalmopathy: No double vision, blurry vision, chemosis, eye pain  2. Postop hypothyroidism - latest thyroid labs reviewed  with pt >> normal: Lab Results  Component Value Date   TSH 1.660 05/30/2020   - she continues on LT4 112 mcg daily - pt feels good on this dose but she does describe weight gain.  We will check a pregnancy test today since her menstrual cycle is also late. - we discussed about taking the thyroid hormone every day, with water, >30 minutes before breakfast, separated by >4 hours from acid reflux medications, calcium, iron, multivitamins. Pt. is taking it correctly. - will check thyroid tests today: TSH and fT4 - If labs are abnormal, she will need to return for repeat TFTs in 1.5 months - Patient tells me that she would like to get pregnant.  I advised her to let me know as soon as she finds out she is pregnant as we need to increase the dose of levothyroxine by 2 tablets a week and recheck her tests in 4 weeks afterwards. -I will see her back in a year, but more frequently for labs  3.  Vitamin D insufficiency -At last visit, year ago, her vitamin D level was slightly low, at 25.  This was checked in the setting of anxiety and other nonspecific symptoms including palpitations, shortness of breath, chest pain, tremors.  Retrospectively, this could have been related to stress at work. -We started vitamin D 2000 units daily >> now on 400 Units in prenatal vitamins -We will repeat a level today  4.  Low vitamin B12 -We checked her vitamin B12 at last visit in the same context (please see problem #3) -Vitamin B12 was at the lower limit of normal, at 283, so I advised her to start 1000 mcg B12 daily >> now on 8 mcg in prenatal vitamins -We will repeat the level today  *Her labs need to go to [facility]  Component     Latest Ref Rng & Units 12/12/2020  T4,Free(Direct)     0.8 - 1.8 ng/dL 1.2  TSH     mIU/L 1.60  Vitamin B12     200 - 1,100 pg/mL 373  Vitamin D, 25-Hydroxy     30 - 100 ng/mL 29 (L)  hCG Quant     mIU/mL <1  No pregnancy.  TFTs excellent.  B12 ok.  Vitamin D slightly low.  I  would suggest to add 1000 units vitamin D daily to her prenatal multivitamin.  Carlus Pavlov, MD PhD [facility]

## 2020-12-12 NOTE — Patient Instructions (Addendum)
Please continue Levothyroxine 112 mcg daily.  Take the thyroid hormone every day, with water, at least 30 minutes before breakfast, separated by at least 4 hours from: - acid reflux medications - calcium - iron - multivitamins  Please stop at the lab.  Please come back for a follow-up appointment in 6-8 months.

## 2020-12-13 ENCOUNTER — Encounter: Payer: Self-pay | Admitting: Internal Medicine

## 2020-12-13 LAB — BETA HCG QUANT (REF LAB): hCG Quant: <1 m[IU]/mL

## 2020-12-13 LAB — T4, FREE: Free T4: 1.2 ng/dL (ref 0.8–1.8)

## 2020-12-13 LAB — VITAMIN D 25 HYDROXY (VIT D DEFICIENCY, FRACTURES): Vit D, 25-Hydroxy: 29 ng/mL — ABNORMAL LOW (ref 30–100)

## 2020-12-13 LAB — VITAMIN B12: Vitamin B-12: 373 pg/mL (ref 200–1100)

## 2020-12-13 LAB — TSH: TSH: 1.21 mIU/L

## 2021-01-17 ENCOUNTER — Encounter: Payer: Self-pay | Admitting: Internal Medicine

## 2021-01-17 ENCOUNTER — Encounter: Payer: Self-pay | Admitting: Family Medicine

## 2021-01-22 ENCOUNTER — Other Ambulatory Visit: Payer: Self-pay | Admitting: Internal Medicine

## 2021-01-22 DIAGNOSIS — E890 Postprocedural hypothyroidism: Secondary | ICD-10-CM

## 2021-01-31 ENCOUNTER — Ambulatory Visit (INDEPENDENT_AMBULATORY_CARE_PROVIDER_SITE_OTHER): Payer: BC Managed Care – PPO | Admitting: Obstetrics and Gynecology

## 2021-01-31 ENCOUNTER — Other Ambulatory Visit: Payer: Self-pay

## 2021-01-31 ENCOUNTER — Other Ambulatory Visit (HOSPITAL_COMMUNITY)
Admission: RE | Admit: 2021-01-31 | Discharge: 2021-01-31 | Disposition: A | Discharge status: Home / Self Care | Payer: BC Managed Care – PPO | Source: Ambulatory Visit | Attending: Obstetrics and Gynecology | Admitting: Obstetrics and Gynecology

## 2021-01-31 ENCOUNTER — Encounter: Payer: Self-pay | Admitting: Obstetrics and Gynecology

## 2021-01-31 VITALS — BP 114/77 | HR 77 | Wt 194.0 lb

## 2021-01-31 DIAGNOSIS — Z369 Encounter for antenatal screening, unspecified: Secondary | ICD-10-CM

## 2021-01-31 DIAGNOSIS — Z3400 Encounter for supervision of normal first pregnancy, unspecified trimester: Secondary | ICD-10-CM | POA: Diagnosis not present

## 2021-01-31 DIAGNOSIS — E039 Hypothyroidism, unspecified: Secondary | ICD-10-CM | POA: Insufficient documentation

## 2021-01-31 DIAGNOSIS — E890 Postprocedural hypothyroidism: Secondary | ICD-10-CM

## 2021-01-31 DIAGNOSIS — Z113 Encounter for screening for infections with a predominantly sexual mode of transmission: Secondary | ICD-10-CM | POA: Insufficient documentation

## 2021-01-31 DIAGNOSIS — O99280 Endocrine, nutritional and metabolic diseases complicating pregnancy, unspecified trimester: Secondary | ICD-10-CM

## 2021-01-31 DIAGNOSIS — N926 Irregular menstruation, unspecified: Secondary | ICD-10-CM

## 2021-01-31 LAB — POCT URINE PREGNANCY: Preg Test, Ur: POSITIVE — AB

## 2021-01-31 NOTE — Progress Notes (Signed)
NOB - thyroidectomy 07/29/2016. RM 3

## 2021-01-31 NOTE — Progress Notes (Signed)
New Obstetric Patient H&P    Chief Complaint: "Desires prenatal care"   History of Present Illness: Patient is a 25 y.o. G1P0000 Not Hispanic or Latino female, presents with amenorrhea and positive home pregnancy test. Patient's last menstrual period was 12/16/2020 (exact date). and based on her  LMP, her EDD is Estimated Date of Delivery: 09/22/21 and her EGA is [redacted]w[redacted]d. Cycles are regular and occur approximately every : 28 days. Her last pap smear was 03/27/2020 and was no abnormalities.    She had a urine pregnancy test which was positive about 1  week(s)  ago. Her last menstrual period was normal. Since her LMP she claims she has experienced some nausea, fatigue, breast tenderness. She denies vaginal bleeding. Her past medical history is contibutory for history of post-surgical hypothyroidism, anxiety. This is her first pregnancy.  Since her LMP, she admits to the use of tobacco products  no There are cats in the home in the home  no She admits close contact with children on a regular basis  yes (teaches dance) She has had chicken pox in the past no She has had Tuberculosis exposures, symptoms, or previously tested positive for TB   no Current or past history of domestic violence. no  Genetic Screening/Teratology Counseling: (Includes patient, baby's father, or anyone in either family with:)   1. Patient's age >/= 50 at [facility]  no 2. Thalassemia ([place], [place], Mediterranean, or Asian background): MCV<80  no 3. Neural tube defect (meningomyelocele, spina bifida, anencephaly)  no 4. Congenital heart defect  no  5. Down syndrome  no 6. Tay-Sachs (Jewish, [place]  no 7. Canavan's Disease  no 8. Sickle cell disease or trait (African)  no  9. Hemophilia or other blood disorders  no  10. Muscular dystrophy  no  11. Cystic fibrosis  no  12. Huntington's Chorea  no  13. Mental retardation/autism  no 14. Other inherited genetic or chromosomal disorder  no 15. Maternal metabolic  disorder (DM, PKU, etc)  no 16. Patient or FOB with a child with a birth defect not listed above no  16a. Patient or FOB with a birth defect themselves no 17. Recurrent pregnancy loss, or stillbirth  no  18. Any medications since LMP other than prenatal vitamins (include vitamins, supplements, OTC meds, drugs, alcohol)  Yes levothyroxine and citalopram 19. Any other genetic/environmental exposure to discuss  no  Infection History:   1. Lives with someone with TB or TB exposed  no  2. Patient or partner has history of genital herpes  no 3. Rash or viral illness since LMP  no 4. History of STI (GC, CT, HPV, syphilis, HIV)  no 5. 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  Obstetric History: G1P0000  Family History:  Family History  Problem Relation Age of Onset   Thyroid disease Other    Heart Problems Paternal Grandfather        CABG x4   Hypertension Maternal Grandfather    Hypertension Father    Gout Father    Arthritis Paternal Grandmother    Gout Paternal Grandmother    Hypertension Paternal Grandmother     Social History:  Social History   Socioeconomic History   Marital status:  Married    Spouse name: Not on file   Number of children: 0   Years of education: Not on file   Highest education level: Not on file  Occupational History   Occupation: Scientist, product/process development   Occupation: Engineering geologist of Dance  Tobacco Use   Smoking status: Never   Smokeless tobacco: Never  Vaping Use   Vaping Use: Never used  Substance and Sexual Activity   Alcohol use: Not Currently    Comment: rarely   Drug use: No   Sexual activity: Yes    Partners: Male    Birth control/protection: Pill  Other Topics Concern   Not on file  Social History Narrative   Not on file   Social Determinants of Health   Financial Resource Strain: Not on file  Food Insecurity: Not on file  Transportation Needs: Not on file  Physical Activity: Not on file  Stress: Not on file  Social Connections: Not on file  Intimate Partner Violence: Not on file    Allergies:  Allergies  Allergen Reactions   Sulfa Antibiotics Rash    Medications: Prior to Admission medications   Medication Sig Start Date End Date Taking? Authorizing Provider  citalopram (CELEXA) 20 MG tablet Take 1 tablet (20 mg total) by mouth daily. 07/18/20  Yes Bacigalupo, Marzella Schlein, MD  levothyroxine (SYNTHROID) 112 MCG tablet TAKE 1 TABLET (112 MCG TOTAL) BY MOUTH DAILY BEFORE BREAKFAST 08/20/20  Yes Carlus Pavlov, MD  clobetasol (OLUX) 0.05 % topical foam Apply topically 2 (two) times daily. 05/30/20   Erasmo Downer, MD    Physical Exam Vitals: Blood pressure 114/77, pulse 77, weight 194 lb (88 kg), last menstrual period 12/16/2020.  General: NAD HEENT: normocephalic, anicteric Pulmonary: No increased work of breathing Abdomen: soft, non-tender, non-distended.  Umbilicus without lesions.  No hepatomegaly, splenomegaly or masses palpable. No evidence of hernia  Genitourinary:  External: Normal external female genitalia.  Normal urethral meatus, normal  Bartholin's and Skene's glands.    Vagina: Normal vaginal  mucosa, no evidence of prolapse.    Cervix: Grossly normal in appearance, no bleeding  Uterus:  Non-enlarged, mobile, normal contour.  No CMT  Adnexa: ovaries non-enlarged, no adnexal masses  Rectal: deferred Extremities: no edema, erythema, or tenderness Neurologic: Grossly intact Psychiatric: mood appropriate, affect full   Assessment: 25 y.o. G1P0000 at [redacted]w[redacted]d presenting to initiate prenatal care  Plan: 1) Avoid alcoholic beverages. 2) Patient encouraged not to smoke.  3) Discontinue the use of all non-medicinal drugs and chemicals.  4) Take prenatal vitamins daily.  5) Nutrition, food safety (fish, cheese advisories, and high nitrite foods) and exercise discussed. 6) Hospital and practice style discussed with cross coverage system.  7) Genetic Screening, such as with 1st Trimester Screening, cell free fetal DNA, AFP testing, and Ultrasound, as well as with amniocentesis and CVS as appropriate, is discussed with patient. At the conclusion of today's visit patient requested genetic testing 8) Dating scan next visit  Vena Austria,  MD, [facility], [facility] 01/31/2021, 10:22 AM

## 2021-02-01 LAB — CBC
Hematocrit: 41.3 % (ref 34.0–46.6)
Hemoglobin: 13.9 g/dL (ref 11.1–15.9)
MCH: 30.6 pg (ref 26.6–33.0)
MCHC: 33.7 g/dL (ref 31.5–35.7)
MCV: 91 fL (ref 79–97)
Platelets: 382 10*3/uL (ref 150–450)
RBC: 4.54 x10E6/uL (ref 3.77–5.28)
RDW: 11.9 % (ref 11.7–15.4)
WBC: 7.5 10*3/uL (ref 3.4–10.8)

## 2021-02-01 LAB — T4, FREE: Free T4: 1.6 ng/dL (ref 0.82–1.77)

## 2021-02-01 LAB — VARICELLA ZOSTER ANTIBODY, IGG: Varicella zoster IgG: 463 index (ref >165)

## 2021-02-01 LAB — ANTIBODY SCREEN: Antibody Screen: NEGATIVE

## 2021-02-01 LAB — RUBELLA SCREEN: Rubella Antibodies, IGG: 2.6 index (ref >0.99)

## 2021-02-01 LAB — HEPATITIS B SURFACE ANTIGEN: Hepatitis B Surface Ag: NEGATIVE

## 2021-02-01 LAB — HIV ANTIBODY (ROUTINE TESTING W REFLEX): HIV Screen 4th Generation wRfx: NONREACTIVE

## 2021-02-01 LAB — RH TYPE: Rh Factor: POSITIVE

## 2021-02-01 LAB — ABO

## 2021-02-01 LAB — CERVICOVAGINAL ANCILLARY ONLY
Chlamydia: NEGATIVE
Comment: NEGATIVE
Comment: NORMAL
Neisseria Gonorrhea: NEGATIVE

## 2021-02-01 LAB — TSH: TSH: 0.516 u[IU]/mL (ref 0.450–4.500)

## 2021-02-01 LAB — RPR: RPR Ser Ql: NONREACTIVE

## 2021-02-03 LAB — URINE CULTURE

## 2021-02-05 MED ORDER — ONDANSETRON 4 MG PO TBDP: 4.0000 mg | ORAL_TABLET | Freq: Four times a day (QID) | ORAL | 2 refills | Status: DC | PRN

## 2021-02-22 ENCOUNTER — Encounter: Payer: Self-pay | Admitting: Family Medicine

## 2021-02-23 ENCOUNTER — Telehealth: Payer: Self-pay

## 2021-02-23 NOTE — Telephone Encounter (Signed)
Copied from CRM #[id number]. Topic: Appointment Scheduling - Scheduling Inquiry for Clinic >> Feb 22, 2021  4:17 PM Bell, Tiffany M wrote: Reason for CRM: patient would like Monday 02/26/2021 any time, patient would like a follow up call. Preferable morning 

## 2021-02-23 NOTE — Telephone Encounter (Signed)
Copied from CRM [id number]. Topic: Appointment Scheduling - Scheduling Inquiry for Clinic >> Feb 22, 2021  4:17 PM Elliot Gault wrote: Reason for CRM: patient would like Monday 02/26/2021 any time, patient would like a follow up call. Preferable morning

## 2021-02-27 ENCOUNTER — Ambulatory Visit (INDEPENDENT_AMBULATORY_CARE_PROVIDER_SITE_OTHER): Payer: BC Managed Care – PPO | Admitting: Obstetrics and Gynecology

## 2021-02-27 ENCOUNTER — Other Ambulatory Visit: Payer: Self-pay

## 2021-02-27 ENCOUNTER — Encounter: Payer: Self-pay | Admitting: Family Medicine

## 2021-02-27 ENCOUNTER — Ambulatory Visit (INDEPENDENT_AMBULATORY_CARE_PROVIDER_SITE_OTHER): Payer: BC Managed Care – PPO | Admitting: Family Medicine

## 2021-02-27 VITALS — BP 126/82 | Wt 195.0 lb

## 2021-02-27 VITALS — BP 103/69 | HR 77 | Temp 99.1°F | Resp 16 | Ht 69.0 in | Wt 196.4 lb

## 2021-02-27 DIAGNOSIS — Z8639 Personal history of other endocrine, nutritional and metabolic disease: Secondary | ICD-10-CM

## 2021-02-27 DIAGNOSIS — O3110X0 Continuing pregnancy after spontaneous abortion of one fetus or more, unspecified trimester, not applicable or unspecified: Secondary | ICD-10-CM | POA: Diagnosis not present

## 2021-02-27 DIAGNOSIS — Z31438 Encounter for other genetic testing of female for procreative management: Secondary | ICD-10-CM

## 2021-02-27 DIAGNOSIS — Z0000 Encounter for general adult medical examination without abnormal findings: Secondary | ICD-10-CM

## 2021-02-27 DIAGNOSIS — Z3400 Encounter for supervision of normal first pregnancy, unspecified trimester: Secondary | ICD-10-CM | POA: Diagnosis not present

## 2021-02-27 DIAGNOSIS — Z3689 Encounter for other specified antenatal screening: Secondary | ICD-10-CM | POA: Diagnosis not present

## 2021-02-27 DIAGNOSIS — F411 Generalized anxiety disorder: Secondary | ICD-10-CM | POA: Diagnosis not present

## 2021-02-27 DIAGNOSIS — O99280 Endocrine, nutritional and metabolic diseases complicating pregnancy, unspecified trimester: Secondary | ICD-10-CM

## 2021-02-27 DIAGNOSIS — E039 Hypothyroidism, unspecified: Secondary | ICD-10-CM

## 2021-02-27 DIAGNOSIS — Z1379 Encounter for other screening for genetic and chromosomal anomalies: Secondary | ICD-10-CM

## 2021-02-27 HISTORY — DX: Continuing pregnancy after spontaneous abortion of one fetus or more, unspecified trimester, not applicable or unspecified: O31.10X0

## 2021-02-27 LAB — POCT URINALYSIS DIPSTICK OB
Glucose, UA: NEGATIVE
POC,PROTEIN,UA: NEGATIVE

## 2021-02-27 NOTE — Patient Instructions (Signed)
Preventive Care 21-26 Years Old, Female Preventive care refers to lifestyle choices and visits with your health care provider that can promote health and wellness. This includes: A yearly physical exam. This is also called an annual wellness visit. Regular dental and eye exams. Immunizations. Screening for certain conditions. Healthy lifestyle choices, such as: Eating a healthy diet. Getting regular exercise. Not using drugs or products that contain nicotine and tobacco. Limiting alcohol use. What can I expect for my preventive care visit? Physical exam Your health care provider may check your: Height and weight. These may be used to calculate your BMI (body mass index). BMI is a measurement that tells if you are at a healthy weight. Heart rate and blood pressure. Body temperature. Skin for abnormal spots. Counseling Your health care provider may ask you questions about your: Past medical problems. Family's medical history. Alcohol, tobacco, and drug use. Emotional well-being. Home life and relationship well-being. Sexual activity. Diet, exercise, and sleep habits. Work and work environment. Access to firearms. Method of birth control. Menstrual cycle. Pregnancy history. What immunizations do I need?  Vaccines are usually given at various ages, according to a schedule. Your health care provider will recommend vaccines for you based on your age, medicalhistory, and lifestyle or other factors, such as travel or where you work. What tests do I need?  Blood tests Lipid and cholesterol levels. These may be checked every 5 years starting at age 20. Hepatitis C test. Hepatitis B test. Screening Diabetes screening. This is done by checking your blood sugar (glucose) after you have not eaten for a while (fasting). STD (sexually transmitted disease) testing, if you are at risk. BRCA-related cancer screening. This may be done if you have a family history of breast, ovarian, tubal, or  peritoneal cancers. Pelvic exam and Pap test. This may be done every 3 years starting at age 21. Starting at age 30, this may be done every 5 years if you have a Pap test in combination with an HPV test. Talk with your health care provider about your test results, treatment options,and if necessary, the need for more tests. Follow these instructions at home: Eating and drinking  Eat a healthy diet that includes fresh fruits and vegetables, whole grains, lean protein, and low-fat dairy products. Take vitamin and mineral supplements as recommended by your health care provider. Do not drink alcohol if: Your health care provider tells you not to drink. You are pregnant, may be pregnant, or are planning to become pregnant. If you drink alcohol: Limit how much you have to 0-1 drink a day. Be aware of how much alcohol is in your drink. In the [place] one drink equals one 12 oz bottle of beer (355 mL), one 5 oz glass of wine (148 mL), or one 1 oz glass of hard liquor (44 mL).  Lifestyle Take daily care of your teeth and gums. Brush your teeth every morning and night with fluoride toothpaste. Floss one time each day. Stay active. Exercise for at least 30 minutes 5 or more days each week. Do not use any products that contain nicotine or tobacco, such as cigarettes, e-cigarettes, and chewing tobacco. If you need help quitting, ask your health care provider. Do not use drugs. If you are sexually active, practice safe sex. Use a condom or other form of protection to prevent STIs (sexually transmitted infections). If you do not wish to become pregnant, use a form of birth control. If you plan to become pregnant, see your health care   provider for a prepregnancy visit. Find healthy ways to cope with stress, such as: Meditation, yoga, or listening to music. Journaling. Talking to a trusted person. Spending time with friends and family. Safety Always wear your seat belt while driving or riding in a  vehicle. Do not drive: If you have been drinking alcohol. Do not ride with someone who has been drinking. When you are tired or distracted. While texting. Wear a helmet and other protective equipment during sports activities. If you have firearms in your house, make sure you follow all gun safety procedures. Seek help if you have been physically or sexually abused. What's next? Go to your health care provider once a year for an annual wellness visit. Ask your health care provider how often you should have your eyes and teeth checked. Stay up to date on all vaccines. This information is not intended to replace advice given to you by your health care provider. Make sure you discuss any questions you have with your healthcare provider. Document Revised: 04/02/2020 Document Reviewed: 04/16/2018 Elsevier Patient Education  2022 [employer].

## 2021-02-27 NOTE — Progress Notes (Signed)
ROB -  Dating scan, some spotting ? Hemorrhoid. [place] RM 1

## 2021-02-27 NOTE — Progress Notes (Signed)
Dating Scan  There is a dichorionic diamniotic twin gestation with on viable fetus visualized.  The second gestational scan does not contain a yolk sac or fetal pole measuring smaller than the viable IUP.    CRL consistent with CRL of 3.00cm (3.03cm, 2.94cm, 3.03cm) consistent with [redacted]w[redacted]d gestation.  FHT 175 BPM YS 0.49cm ROV normal LOV normal No uterine fibroids No free fluid.  There is a viable singleton gestation with a second failed gestation noted superior to the viable gestation.  The fetal biometry correlates with established dating. Detailed evaluation of the fetal anatomy is precluded by early gestational age.  It must be noted that a normal ultrasound particular at this early gestational age is unable to rule out fetal aneuploidy, risk of first trimester miscarriage, or anatomic birth defects.  Vena Austria, MD, [facility], [facility]

## 2021-02-27 NOTE — Progress Notes (Signed)
Complete physical exam   Patient: Debra [name]   DOB: [birth year]   24 y.o. Female  MRN: [id number] Visit Date: 02/27/2021  Today's healthcare provider: Lavon Paganini, MD   Chief Complaint  Patient presents with   Annual Exam   Subjective    Debra [name] is a 25 y.o. female who presents today for a complete physical exam.  She reports consuming a general diet. Gym/ health club routine includes cardio. She generally feels well. She reports sleeping well. She does not have additional problems to discuss today.   HPI   Pregnancy  She is [redacted] wks pregnant and she has her first ultrasound today with Dr. Georgianne Fick. She has a concern because since she got off birth control she has gained a lot of weight. She has spoke with Dr. Georgianne Fick about her 20 mg celexa and reassured her this is a safe medication during pregnancy and there is possibility to go up in dosage if needed. Since resigning from her job her stress level has decreased.   Hypothyroidism  She has seen her endocrinologist, Dr. Cruzita Lederer and her dosage of synthroid has increased from 1 to 2 tablets for her hypothyroidism.   Screenings  03/27/20 Pap-negative  Vaccines She is not received COVID vaccine, her OB has recommended to wait until after her first trimester.    Past Medical History:  Diagnosis Date   Allergy    Anxiety    Depression    History of irregular menstrual cycles    Irregular menses    Thyroid disease    Past Surgical History:  Procedure Laterality Date   TONSILLECTOMY AND ADENOIDECTOMY  2012   TOTAL THYROIDECTOMY     Social History   Socioeconomic History   Marital status: Married    Spouse name: Not on file   Number of children: 0   Years of education: Not on file   Highest education level: Not on file  Occupational History   Occupation: Photographer   Occupation: Chartered certified accountant of [employer]  Tobacco Use   Smoking status: Never   Smokeless tobacco: Never  Vaping Use    Vaping Use: Never used  Substance and Sexual Activity   Alcohol use: Not Currently    Comment: rarely   Drug use: No   Sexual activity: Yes    Partners: Male    Birth control/protection: Pill  Other Topics Concern   Not on file  Social History Narrative   Not on file   Social Determinants of Health   Financial Resource Strain: Not on file  Food Insecurity: Not on file  Transportation Needs: Not on file  Physical Activity: Not on file  Stress: Not on file  Social Connections: Not on file  Intimate Partner Violence: Not on file   Family Status  Relation Name Status   Other pat [place] Alive   PGF  Alive   [employer]  Alive   Father  (Not Specified)   PGM  (Not Specified)   Family History  Problem Relation Age of Onset   Thyroid disease Other    Heart Problems Paternal Grandfather        CABG x4   Hypertension Maternal Grandfather    Hypertension Father    Gout Father    Arthritis Paternal Grandmother    Gout Paternal Grandmother    Hypertension Paternal Grandmother    Allergies  Allergen Reactions   Sulfa Antibiotics Rash    Patient Care Team: Virginia Crews,  MD as PCP - General (Family Medicine)   Medications: Outpatient Medications Prior to Visit  Medication Sig   citalopram (CELEXA) 20 MG tablet Take 1 tablet (20 mg total) by mouth daily.   clobetasol (OLUX) 0.05 % topical foam Apply topically 2 (two) times daily.   levothyroxine (SYNTHROID) 112 MCG tablet TAKE 1 TABLET (112 MCG TOTAL) BY MOUTH DAILY BEFORE BREAKFAST (Patient taking differently: Take 112 mcg by mouth daily before breakfast. Patient is taking 2 tablets twice a week, while pregnant.)   ondansetron (ZOFRAN ODT) 4 MG disintegrating tablet Take 1 tablet (4 mg total) by mouth every 6 (six) hours as needed for nausea.   No facility-administered medications prior to visit.    Review of Systems  Constitutional:  Negative for chills, fatigue and fever.  HENT:  Negative for ear pain, sinus pressure,  sinus pain and sore throat.   Eyes:  Negative for pain and visual disturbance.  Respiratory:  Negative for cough, chest tightness, shortness of breath and wheezing.   Cardiovascular:  Negative for chest pain, palpitations and leg swelling.  Gastrointestinal:  Negative for abdominal pain, blood in stool, constipation, diarrhea, nausea and vomiting.  Genitourinary:  Negative for dysuria, flank pain, frequency, pelvic pain and urgency.  Musculoskeletal:  Negative for back pain, myalgias and neck pain.  Neurological:  Negative for dizziness, seizures, syncope, weakness, light-headedness, numbness and headaches.   Last CBC Lab Results  Component Value Date   WBC 7.5 01/31/2021   HGB 13.9 01/31/2021   HCT 41.3 01/31/2021   MCV 91 01/31/2021   MCH 30.6 01/31/2021   RDW 11.9 01/31/2021   PLT 382 91/63/8466   Last metabolic panel Lab Results  Component Value Date   GLUCOSE 81 05/30/2020   NA 140 05/30/2020   K 4.0 05/30/2020   CL 101 05/30/2020   CO2 26 05/30/2020   BUN 10 05/30/2020   CREATININE 0.75 05/30/2020   GFRNONAA 111 05/30/2020   GFRAA 128 05/30/2020   CALCIUM 9.8 05/30/2020   PROT 7.0 05/30/2020   ALBUMIN 4.7 05/30/2020   LABGLOB 2.3 05/30/2020   AGRATIO 2.0 05/30/2020   BILITOT 0.3 05/30/2020   ALKPHOS 70 05/30/2020   AST 13 05/30/2020   ALT 7 05/30/2020   ANIONGAP 10 07/15/2018   Last lipids No results found for: CHOL, HDL, LDLCALC, LDLDIRECT, TRIG, CHOLHDL Last hemoglobin A1c No results found for: HGBA1C Last thyroid functions Lab Results  Component Value Date   TSH 0.516 01/31/2021   T3TOTAL 440 (H) 01/10/2015   T4TOTAL 8.9 12/31/2016   Last vitamin D Lab Results  Component Value Date   VD25OH 29 (L) 12/12/2020   Last vitamin B12 and Folate Lab Results  Component Value Date   VITAMINB12 373 12/12/2020      Objective    BP 103/69 (BP Location: Left Arm, Patient Position: Sitting, Cuff Size: Large)   Pulse 77   Temp 99.1 F (37.3 C) (Oral)    Resp 16   Ht _0  (1.753 m)   Wt 196 lb 6.4 oz (89.1 kg)   LMP 12/16/2020 (Exact Date)   SpO2 99%   BMI 29.00 kg/m  BP Readings from Last 3 Encounters:  02/27/21 103/69  01/31/21 114/77  12/12/20 128/88   Wt Readings from Last 3 Encounters:  02/27/21 196 lb 6.4 oz (89.1 kg)  01/31/21 194 lb (88 kg)  12/12/20 190 lb (86.2 kg)   Physical Exam Vitals reviewed.  Constitutional:      General: She is not  in acute distress.    Appearance: Normal appearance. She is well-developed. She is not diaphoretic.  HENT:     Head: Normocephalic and atraumatic.     Right Ear: Tympanic membrane, ear canal and external ear normal.     Left Ear: Tympanic membrane, ear canal and external ear normal.     Nose: Nose normal.     Mouth/Throat:     Mouth: Mucous membranes are moist.     Pharynx: Oropharynx is clear. No oropharyngeal exudate.  Eyes:     General: No scleral icterus.    Conjunctiva/sclera: Conjunctivae normal.     Pupils: Pupils are equal, round, and reactive to light.  Neck:     Thyroid: No thyromegaly.  Cardiovascular:     Rate and Rhythm: Normal rate and regular rhythm.     Pulses: Normal pulses.     Heart sounds: Normal heart sounds. No murmur heard. Pulmonary:     Effort: Pulmonary effort is normal. No respiratory distress.     Breath sounds: Normal breath sounds. No wheezing or rales.  Abdominal:     General: There is no distension.     Palpations: Abdomen is soft.     Tenderness: There is no abdominal tenderness.  Musculoskeletal:        General: No deformity.     Cervical back: Neck supple.     Right lower leg: No edema.     Left lower leg: No edema.  Lymphadenopathy:     Cervical: No cervical adenopathy.  Skin:    General: Skin is warm and dry.     Findings: No rash.  Neurological:     Mental Status: She is alert and oriented to person, place, and time. Mental status is at baseline.     Sensory: No sensory deficit.     Motor: No weakness.     Gait: Gait  normal.  Psychiatric:        Mood and Affect: Mood normal.        Behavior: Behavior normal.        Thought Content: Thought content normal.    Last depression screening scores PHQ 2/9 Scores 02/27/2021 07/18/2020 05/30/2020  PHQ - 2 Score _0 PHQ- 9 Score _1 Last fall risk screening Fall Risk  02/27/2021  Falls in the past year? 0  Number falls in past yr: 0  Injury with Fall? 0  Risk for fall due to : No Fall Risks  Follow up Falls evaluation completed   Last Audit-C alcohol use screening Alcohol Use Disorder Test (AUDIT) 02/27/2021  1. How often do you have a drink containing alcohol? 0  2. How many drinks containing alcohol do you have on a typical day when you are drinking? 0  3. How often do you have six or more drinks on one occasion? 0  AUDIT-C Score 0  Alcohol Brief Interventions/Follow-up -   A score of 3 or more in women, and 4 or more in men indicates increased risk for alcohol abuse, EXCEPT if all of the points are from question 1   No results found for any visits on 02/27/21.  Assessment & Plan     Problem List Items Addressed This Visit       Other   GAD (generalized anxiety disorder)    Chronic and well controlled Continue celexa       Other Visit Diagnoses     Encounter for annual health examination    -  Primary      Routine Health Maintenance and Physical Exam  Exercise Activities and Dietary recommendations  Goals   None     Immunization History  Administered Date(s) Administered   DTaP 07/01/1995, 09/01/1995, 10/29/1995, 08/02/1996, 12/26/2000   HPV 9-valent 12/18/2007, 02/24/2008, 08/15/2008   Hepatitis A 11/10/2013   Hepatitis B Jun 04, 1995, 07/01/1995, 10/29/1995   HiB (PRP-OMP) 07/01/1995, 09/01/1995, 10/29/1995, 07/23/1996   IPV 07/01/1995, 09/01/1995, 10/29/1995, 12/26/2000   Influenza Nasal 06/17/2005, 07/18/2008   Influenza,inj,Quad PF,6+ Mos 07/30/2018   Influenza-Unspecified 07/16/2006   MMR 08/02/1996,  12/26/2000   Meningococcal Conjugate 11/10/2013   Tdap 12/01/2006, 03/30/2020   Varicella 05/06/1996, 12/20/2008    Health Maintenance  Topic Date Due   COVID-19 Vaccine (1) Never done   Hepatitis C Screening  Never done   INFLUENZA VACCINE  03/19/2021   PAP-Cervical Cytology Screening  03/28/2023   PAP SMEAR-Modifier  03/28/2023   TETANUS/TDAP  03/30/2030   HPV VACCINES  Completed   HIV Screening  Completed   Pneumococcal Vaccine 25-13 Years old  Aged Out   Discussed health benefits of physical activity, and encouraged her to engage in regular exercise appropriate for her age and condition.  Return in about 1 year (around 02/27/2022) for CPE.     I,Essence Turner,acting as a Education administrator for Lavon Paganini, MD.,have documented all relevant documentation on the behalf of Lavon Paganini, MD,as directed by  Lavon Paganini, MD while in the presence of Lavon Paganini, MD.  I, Lavon Paganini, MD, have reviewed all documentation for this visit. The documentation on 02/27/21 for the exam, diagnosis, procedures, and orders are all accurate and complete.   Cinthia Rodden, Dionne Bucy, MD, MPH [facility]

## 2021-02-27 NOTE — Assessment & Plan Note (Signed)
Chronic and well controlled Continue celexa 

## 2021-02-27 NOTE — Progress Notes (Signed)
Routine Prenatal Care Visit  Subjective  Debra [name] is a 25 y.o. G1P0000 at 79w3dbeing seen today for ongoing prenatal care.  She is currently monitored for the following issues for this low-risk pregnancy and has H/O Graves' disease; Postoperative hypothyroidism; Chest pain; S/P total thyroidectomy; Chronic constipation; Psoriasis of scalp; GAD (generalized anxiety disorder); Hypothyroidism affecting pregnancy, antepartum; Supervision of normal first pregnancy, antepartum; and Vanishing twin syndrome on their problem list.  ----------------------------------------------------------------------------------- Patient reports no complaints.   Contractions: Not present. Vag. Bleeding: None.  Movement: Absent. Denies leaking of fluid.  ----------------------------------------------------------------------------------- The following portions of the patient's history were reviewed and updated as appropriate: allergies, current medications, past family history, past medical history, past social history, past surgical history and problem list. Problem list updated.   Objective  Blood pressure 126/82, weight 195 lb (88.5 kg), last menstrual period 12/16/2020. Pregravid weight 185 lb (83.9 kg) Total Weight Gain 10 lb (4.536 kg) Urinalysis:      Fetal Status: Fetal Heart Rate (bpm): 175   Movement: Absent     General:  Alert, oriented and cooperative. Patient is in no acute distress.  Skin: Skin is warm and dry. No rash noted.   Cardiovascular: Normal heart rate noted  Respiratory: Normal respiratory effort, no problems with respiration noted  Abdomen: Soft, gravid, appropriate for gestational age. Pain/Pressure: Absent     Pelvic:  Cervical exam deferred        Extremities: Normal range of motion.     ental Status: Normal mood and affect. Normal behavior. Normal judgment and thought content.   Immunization History  Administered Date(s) Administered   DTaP 07/01/1995, 09/01/1995,  10/29/1995, 08/02/1996, 12/26/2000   HPV 9-valent 12/18/2007, 02/24/2008, 08/15/2008   Hepatitis A 11/10/2013   Hepatitis B 01996-11-20 07/01/1995, 10/29/1995   HiB (PRP-OMP) 07/01/1995, 09/01/1995, 10/29/1995, 07/23/1996   IPV 07/01/1995, 09/01/1995, 10/29/1995, 12/26/2000   Influenza Nasal 06/17/2005, 07/18/2008   Influenza,inj,Quad PF,6+ Mos 07/30/2018   Influenza-Unspecified 07/16/2006   MMR 08/02/1996, 12/26/2000   Meningococcal Conjugate 11/10/2013   Tdap 12/01/2006, 03/30/2020   Varicella 05/06/1996, 12/20/2008     Assessment   25y.o. G1P0000 at 131w3dy  09/22/2021, by Last Menstrual Period presenting for routine prenatal visit  Plan   Pregnancy 1 Problems (from 01/31/21 to present)     Problem Noted Resolved   Vanishing twin syndrome 02/27/2021 by StMalachy MoodMD No   Hypothyroidism affecting pregnancy, antepartum 01/31/2021 by StMalachy MoodMD No   Overview Signed 02/27/2021  5:34 PM by StMalachy MoodMD    qTrimester thyroid panel       Supervision of normal first pregnancy, antepartum 01/31/2021 by StMalachy MoodMD No   Overview Signed 02/27/2021  5:34 PM by StMalachy MoodMD     Nursing Staff Provider  Office Location  [facility] Dating  [id number]  Language  English Anatomy US [place] Flu Vaccine   Genetic Screen  NIPS:   TDaP vaccine    Hgb A1C or  GTT Early : N/A Third trimester :   Covid    LAB RESULTS   Rhogam  N/A Blood Type A/Positive/-- (06/15 1117)   Feeding Plan  Antibody Negative (06/15 1117)  Contraception  Rubella 2.60 (06/15 1117)  Circumcision  RPR Non Reactive (06/15 1117)   Pediatrician   HBsAg Negative (06/15 1117)   Support Person  HIV Non Reactive (06/15 1117)  Prenatal Classes  Varicella Immune    GBS  (For PCN allergy, check sensitivities)  BTL Consent N/A    VBAC Consent N/A Pap 03/27/2020 NILM    Hgb Electro      CF      SMA                    Gestational age appropriate obstetric precautions  including but not limited to vaginal bleeding, contractions, leaking of fluid and fetal movement were reviewed in detail with the patient.   1) Dating - S=D on scan today.  Second empty gestational sac noted consistent with vanishing twin.  She did have some vaginal spotting in the past week none since.  2) Genetics - [place] today   Return in about 4 weeks (around 03/27/2021) for ROB.  Malachy Mood, MD, [facility], [place]

## 2021-02-28 LAB — TSH+FREE T4
Free T4: 1.25 ng/dL (ref 0.82–1.77)
TSH: 1.78 u[IU]/mL (ref 0.450–4.500)

## 2021-03-04 LAB — MATERNIT 21 PLUS CORE, BLOOD
Fetal Fraction: 6
Result (T21): NEGATIVE
Trisomy 13 (Patau syndrome): NEGATIVE
Trisomy 18 (Edwards syndrome): NEGATIVE
Trisomy 21 (Down syndrome): NEGATIVE

## 2021-03-09 LAB — INHERITEST CORE(CF97,SMA,FRAX)

## 2021-03-15 ENCOUNTER — Other Ambulatory Visit: Payer: Self-pay | Admitting: Internal Medicine

## 2021-03-16 ENCOUNTER — Other Ambulatory Visit: Payer: Self-pay | Admitting: Obstetrics and Gynecology

## 2021-03-16 MED ORDER — LEVOTHYROXINE SODIUM 112 MCG PO TABS: 112.0000 ug | ORAL_TABLET | Freq: Every day | ORAL | 1 refills | Status: DC

## 2021-03-16 NOTE — Telephone Encounter (Signed)
Pt is leaving for vacation tomorrow and needs a refill on her levothyroxine before she leaves. She is requesting it goes to the [facility] off [address] in [place].   Pt requests a MyChart message when it has been sent in so she knows when to pick it up.

## 2021-03-19 NOTE — Telephone Encounter (Signed)
Spoke with [facility] Rx refilled already.

## 2021-03-29 ENCOUNTER — Ambulatory Visit (INDEPENDENT_AMBULATORY_CARE_PROVIDER_SITE_OTHER): Payer: BC Managed Care – PPO | Admitting: Obstetrics and Gynecology

## 2021-03-29 ENCOUNTER — Other Ambulatory Visit: Payer: Self-pay

## 2021-03-29 VITALS — BP 118/72 | Wt 191.0 lb

## 2021-03-29 DIAGNOSIS — Z3A14 14 weeks gestation of pregnancy: Secondary | ICD-10-CM

## 2021-03-29 DIAGNOSIS — Z363 Encounter for antenatal screening for malformations: Secondary | ICD-10-CM

## 2021-03-29 DIAGNOSIS — Z3400 Encounter for supervision of normal first pregnancy, unspecified trimester: Secondary | ICD-10-CM

## 2021-03-29 LAB — POCT URINALYSIS DIPSTICK OB
Glucose, UA: NEGATIVE
POC,PROTEIN,UA: NEGATIVE

## 2021-03-29 NOTE — Progress Notes (Signed)
ROB - constipation. RM 4

## 2021-03-29 NOTE — Progress Notes (Signed)
Routine Prenatal Care Visit  Subjective  Debra [name] is a 25 y.o. G1P0000 at [redacted]w[redacted]d being seen today for ongoing prenatal care.  She is currently monitored for the following issues for this low-risk pregnancy and has H/O Graves' disease; Postoperative hypothyroidism; Chest pain; S/P total thyroidectomy; Chronic constipation; Psoriasis of scalp; GAD (generalized anxiety disorder); Hypothyroidism affecting pregnancy, antepartum; Supervision of normal first pregnancy, antepartum; and Vanishing twin syndrome on their problem list.  ----------------------------------------------------------------------------------- Patient reports no complaints.   Contractions: Not present. Vag. Bleeding: None.  Movement: Absent. Denies leaking of fluid.  ----------------------------------------------------------------------------------- The following portions of the patient's history were reviewed and updated as appropriate: allergies, current medications, past family history, past medical history, past social history, past surgical history and problem list. Problem list updated.   Objective  Blood pressure 118/72, weight 191 lb (86.6 kg), last menstrual period 12/16/2020. Pregravid weight 185 lb (83.9 kg) Total Weight Gain 6 lb (2.722 kg) Urinalysis:      Fetal Status: Fetal Heart Rate (bpm): 145   Movement: Absent     General:  Alert, oriented and cooperative. Patient is in no acute distress.  Skin: Skin is warm and dry. No rash noted.   Cardiovascular: Normal heart rate noted  Respiratory: Normal respiratory effort, no problems with respiration noted  Abdomen: Soft, gravid, appropriate for gestational age. Pain/Pressure: Absent     Pelvic:  Cervical exam deferred        Extremities: Normal range of motion.     ental Status: Normal mood and affect. Normal behavior. Normal judgment and thought content.     Assessment   25 y.o. G1P0000 at [redacted]w[redacted]d by  09/22/2021, by Last Menstrual Period presenting  for routine prenatal visit  Plan   Pregnancy 1 Problems (from 01/31/21 to present)     Problem Noted Resolved   Vanishing twin syndrome 02/27/2021 by Vena Austria, MD No   Hypothyroidism affecting pregnancy, antepartum 01/31/2021 by Vena Austria, MD No   Overview Signed 02/27/2021  5:34 PM by Vena Austria, MD    qTrimester thyroid panel      Supervision of normal first pregnancy, antepartum 01/31/2021 by Vena Austria, MD No   Overview Addendum 03/09/2021  8:17 AM by Vena Austria, MD     Nursing Staff Provider  Office Location  [facility] Dating  [redacted]w[redacted]d Ultrasound  Language  English Anatomy US    Flu Vaccine   Genetic Screen  NIPS: Normal XY  TDaP vaccine    Hgb A1C or  GTT Early : N/A Third trimester :   Covid    LAB RESULTS   Rhogam  N/A Blood Type A/Positive/-- (06/15 1117)   Feeding Plan  Antibody Negative (06/15 1117)  Contraception  Rubella 2.60 (06/15 1117)  Circumcision  RPR Non Reactive (06/15 1117)   Pediatrician   HBsAg Negative (06/15 1117)   Support Person  HIV Non Reactive (06/15 1117)  Prenatal Classes  Varicella Immune    GBS  (For PCN allergy, check sensitivities)   BTL Consent N/A    VBAC Consent N/A Pap 03/27/2020 NILM    Hgb Electro      CF Negative     SMA Negative    Fragile-X Negative             Gestational age appropriate obstetric precautions including but not limited to vaginal bleeding, contractions, leaking of fluid and fetal movement were reviewed in detail with the patient.    [ ]  Anatomy scan scheduled  Return in  about 4 weeks (around 04/26/2021) for ROB.  Vena Austria, MD, [facility], [facility] 03/29/2021, 10:31 AM

## 2021-03-30 ENCOUNTER — Encounter: Payer: Self-pay | Admitting: Family Medicine

## 2021-04-24 ENCOUNTER — Other Ambulatory Visit: Payer: Self-pay

## 2021-04-24 ENCOUNTER — Ambulatory Visit (INDEPENDENT_AMBULATORY_CARE_PROVIDER_SITE_OTHER): Payer: BC Managed Care – PPO | Admitting: Obstetrics and Gynecology

## 2021-04-24 VITALS — BP 110/68 | Wt 193.0 lb

## 2021-04-24 DIAGNOSIS — O99280 Endocrine, nutritional and metabolic diseases complicating pregnancy, unspecified trimester: Secondary | ICD-10-CM

## 2021-04-24 DIAGNOSIS — E039 Hypothyroidism, unspecified: Secondary | ICD-10-CM

## 2021-04-24 DIAGNOSIS — Z3A18 18 weeks gestation of pregnancy: Secondary | ICD-10-CM

## 2021-04-24 DIAGNOSIS — Z3400 Encounter for supervision of normal first pregnancy, unspecified trimester: Secondary | ICD-10-CM

## 2021-04-24 DIAGNOSIS — Z8639 Personal history of other endocrine, nutritional and metabolic disease: Secondary | ICD-10-CM

## 2021-04-24 LAB — POCT URINALYSIS DIPSTICK OB
Glucose, UA: NEGATIVE
POC,PROTEIN,UA: NEGATIVE

## 2021-04-24 NOTE — Progress Notes (Signed)
Routine Prenatal Care Visit  Subjective  Debra [name] is a 25 y.o. G1P0000 at [redacted]w[redacted]d being seen today for ongoing prenatal care.  She is currently monitored for the following issues for this high-risk pregnancy and has H/O Graves' disease; Postoperative hypothyroidism; Chest pain; S/P total thyroidectomy; Chronic constipation; Psoriasis of scalp; GAD (generalized anxiety disorder); Hypothyroidism affecting pregnancy, antepartum; Supervision of normal first pregnancy, antepartum; and Vanishing twin syndrome on their problem list.  ----------------------------------------------------------------------------------- Patient reports no complaints.   Contractions: Not present. Vag. Bleeding: None.  Movement: Absent. Denies leaking of fluid.  ----------------------------------------------------------------------------------- The following portions of the patient's history were reviewed and updated as appropriate: allergies, current medications, past family history, past medical history, past social history, past surgical history and problem list. Problem list updated.   Objective  Blood pressure 110/68, weight 193 lb (87.5 kg), last menstrual period 12/16/2020. Pregravid weight 185 lb (83.9 kg) Total Weight Gain 8 lb (3.629 kg) Urinalysis:      Fetal Status: Fetal Heart Rate (bpm): 145   Movement: Absent     General:  Alert, oriented and cooperative. Patient is in no acute distress.  Skin: Skin is warm and dry. No rash noted.   Cardiovascular: Normal heart rate noted  Respiratory: Normal respiratory effort, no problems with respiration noted  Abdomen: Soft, gravid, appropriate for gestational age. Pain/Pressure: Absent     Pelvic:  Cervical exam deferred        Extremities: Normal range of motion.     ental Status: Normal mood and affect. Normal behavior. Normal judgment and thought content.     Assessment   25 y.o. G1P0000 at [redacted]w[redacted]d by  09/22/2021, by Last Menstrual Period presenting  for routine prenatal visit  Plan   Pregnancy 1 Problems (from 01/31/21 to present)     Problem Noted Resolved   Vanishing twin syndrome 02/27/2021 by Vena Austria, MD No   Hypothyroidism affecting pregnancy, antepartum 01/31/2021 by Vena Austria, MD No   Overview Signed 02/27/2021  5:34 PM by Vena Austria, MD    qTrimester thyroid panel      Supervision of normal first pregnancy, antepartum 01/31/2021 by Vena Austria, MD No   Overview Addendum 03/09/2021  8:17 AM by Vena Austria, MD     Nursing Staff Provider  Office Location  [facility] Dating  [redacted]w[redacted]d Ultrasound  Language  English Anatomy US    Flu Vaccine   Genetic Screen  NIPS: Normal XY  TDaP vaccine    Hgb A1C or  GTT Early : N/A Third trimester :   Covid    LAB RESULTS   Rhogam  N/A Blood Type A/Positive/-- (06/15 1117)   Feeding Plan  Antibody Negative (06/15 1117)  Contraception  Rubella 2.60 (06/15 1117)  Circumcision  RPR Non Reactive (06/15 1117)   Pediatrician   HBsAg Negative (06/15 1117)   Support Person  HIV Non Reactive (06/15 1117)  Prenatal Classes  Varicella Immune    GBS  (For PCN allergy, check sensitivities)   BTL Consent N/A    VBAC Consent N/A Pap 03/27/2020 NILM    Hgb Electro      CF Negative     SMA Negative    Fragile-X Negative             Gestational age appropriate obstetric precautions including but not limited to vaginal bleeding, contractions, leaking of fluid and fetal movement were reviewed in detail with the patient.    - anatomy scan 9/16 - repeat thyroid panel  today  Return in about 4 weeks (around 05/22/2021) for ROB.  Vena Austria, MD, [facility], [facility] 04/24/2021, 2:34 PM

## 2021-04-24 NOTE — Progress Notes (Signed)
ROB - requesting thyroid labs. RM 4

## 2021-04-25 LAB — THYROID PANEL WITH TSH
Free Thyroxine Index: 2.4 (ref 1.2–4.9)
T3 Uptake Ratio: 18 % — ABNORMAL LOW (ref 24–39)
T4, Total: 13.5 ug/dL — ABNORMAL HIGH (ref 4.5–12.0)
TSH: 2.01 u[IU]/mL (ref 0.450–4.500)

## 2021-05-04 ENCOUNTER — Ambulatory Visit
Admission: RE | Admit: 2021-05-04 | Discharge: 2021-05-04 | Disposition: A | Discharge status: Home / Self Care | Payer: BC Managed Care – PPO | Source: Ambulatory Visit | Attending: Obstetrics and Gynecology | Admitting: Obstetrics and Gynecology

## 2021-05-04 ENCOUNTER — Other Ambulatory Visit: Payer: Self-pay

## 2021-05-04 DIAGNOSIS — Z363 Encounter for antenatal screening for malformations: Secondary | ICD-10-CM | POA: Insufficient documentation

## 2021-05-04 DIAGNOSIS — Z3400 Encounter for supervision of normal first pregnancy, unspecified trimester: Secondary | ICD-10-CM | POA: Insufficient documentation

## 2021-05-07 ENCOUNTER — Other Ambulatory Visit: Payer: Self-pay

## 2021-05-07 ENCOUNTER — Ambulatory Visit (INDEPENDENT_AMBULATORY_CARE_PROVIDER_SITE_OTHER): Payer: BC Managed Care – PPO | Admitting: Obstetrics and Gynecology

## 2021-05-07 VITALS — BP 118/72 | Wt 196.0 lb

## 2021-05-07 DIAGNOSIS — Z3400 Encounter for supervision of normal first pregnancy, unspecified trimester: Secondary | ICD-10-CM

## 2021-05-07 DIAGNOSIS — Z362 Encounter for other antenatal screening follow-up: Secondary | ICD-10-CM

## 2021-05-07 DIAGNOSIS — Z3A20 20 weeks gestation of pregnancy: Secondary | ICD-10-CM

## 2021-05-07 DIAGNOSIS — Z369 Encounter for antenatal screening, unspecified: Secondary | ICD-10-CM

## 2021-05-07 LAB — POCT URINALYSIS DIPSTICK OB
Glucose, UA: NEGATIVE
POC,PROTEIN,UA: NEGATIVE

## 2021-05-07 NOTE — Progress Notes (Signed)
ROB - anatomy scan [facility]) no concerns. RM 4

## 2021-05-07 NOTE — Progress Notes (Signed)
Routine Prenatal Care Visit  Subjective  Debra [name] is a 25 y.o. G1P0000 at [redacted]w[redacted]d being seen today for ongoing prenatal care.  She is currently monitored for the following issues for this low-risk pregnancy and has H/O Graves' disease; Postoperative hypothyroidism; Chest pain; S/P total thyroidectomy; Chronic constipation; Psoriasis of scalp; GAD (generalized anxiety disorder); Hypothyroidism affecting pregnancy, antepartum; Supervision of normal first pregnancy, antepartum; and Vanishing twin syndrome on their problem list.  ----------------------------------------------------------------------------------- Patient reports no cramping.   Contractions: Not present. Vag. Bleeding: None.  Movement: Present. Denies leaking of fluid.  ----------------------------------------------------------------------------------- The following portions of the patient's history were reviewed and updated as appropriate: allergies, current medications, past family history, past medical history, past social history, past surgical history and problem list. Problem list updated.   Objective  Blood pressure 118/72, weight 196 lb (88.9 kg), last menstrual period 12/16/2020. Pregravid weight 185 lb (83.9 kg) Total Weight Gain 11 lb (4.99 kg) Urinalysis:      Fetal Status: Fetal Heart Rate (bpm): 140   Movement: Present     General:  Alert, oriented and cooperative. Patient is in no acute distress.  Skin: Skin is warm and dry. No rash noted.   Cardiovascular: Normal heart rate noted  Respiratory: Normal respiratory effort, no problems with respiration noted  Abdomen: Soft, gravid, appropriate for gestational age. Pain/Pressure: Absent     Pelvic:  Cervical exam deferred        Extremities: Normal range of motion.     ental Status: Normal mood and affect. Normal behavior. Normal judgment and thought content.     Assessment   25 y.o. G1P0000 at [redacted]w[redacted]d by  09/22/2021, by Last Menstrual Period presenting  for routine prenatal visit  Plan   Pregnancy 1 Problems (from 01/31/21 to present)     Problem Noted Resolved   Vanishing twin syndrome 02/27/2021 by Vena Austria, MD No   Hypothyroidism affecting pregnancy, antepartum 01/31/2021 by Vena Austria, MD No   Overview Signed 02/27/2021  5:34 PM by Vena Austria, MD    qTrimester thyroid panel      Supervision of normal first pregnancy, antepartum 01/31/2021 by Vena Austria, MD No   Overview Addendum 03/09/2021  8:17 AM by Vena Austria, MD     Nursing Staff Provider  Office Location  [facility] Dating  [redacted]w[redacted]d Ultrasound  Language  English Anatomy US    Flu Vaccine   Genetic Screen  NIPS: Normal XY  TDaP vaccine    Hgb A1C or  GTT Early : N/A Third trimester :   Covid    LAB RESULTS   Rhogam  N/A Blood Type A/Positive/-- (06/15 1117)   Feeding Plan  Antibody Negative (06/15 1117)  Contraception  Rubella 2.60 (06/15 1117)  Circumcision  RPR Non Reactive (06/15 1117)   Pediatrician   HBsAg Negative (06/15 1117)   Support Person  HIV Non Reactive (06/15 1117)  Prenatal Classes  Varicella Immune    GBS  (For PCN allergy, check sensitivities)   BTL Consent N/A    VBAC Consent N/A Pap 03/27/2020 NILM    Hgb Electro      CF Negative     SMA Negative    Fragile-X Negative             Gestational age appropriate obstetric precautions including but not limited to vaginal bleeding, contractions, leaking of fluid and fetal movement were reviewed in detail with the patient.    - anatomy scan incomplete face and cord insertion  follow ordered in 4 weeks  Return in about 4 weeks (around 06/04/2021) for 4 week ROB, 8 week ROB and 28 week labs.  Vena Austria, MD, [facility], [facility] 05/07/2021, 10:34 AM

## 2021-05-22 ENCOUNTER — Encounter: Payer: BC Managed Care – PPO | Admitting: Obstetrics and Gynecology

## 2021-06-01 ENCOUNTER — Ambulatory Visit
Admission: RE | Admit: 2021-06-01 | Discharge: 2021-06-01 | Disposition: A | Discharge status: Home / Self Care | Payer: BC Managed Care – PPO | Source: Ambulatory Visit | Attending: Obstetrics and Gynecology | Admitting: Obstetrics and Gynecology

## 2021-06-01 ENCOUNTER — Other Ambulatory Visit: Payer: Self-pay

## 2021-06-01 DIAGNOSIS — Z3400 Encounter for supervision of normal first pregnancy, unspecified trimester: Secondary | ICD-10-CM | POA: Insufficient documentation

## 2021-06-01 DIAGNOSIS — Z362 Encounter for other antenatal screening follow-up: Secondary | ICD-10-CM | POA: Insufficient documentation

## 2021-06-04 ENCOUNTER — Ambulatory Visit (INDEPENDENT_AMBULATORY_CARE_PROVIDER_SITE_OTHER): Payer: BC Managed Care – PPO | Admitting: Obstetrics and Gynecology

## 2021-06-04 ENCOUNTER — Other Ambulatory Visit: Payer: Self-pay

## 2021-06-04 VITALS — BP 112/68 | Wt 201.0 lb

## 2021-06-04 DIAGNOSIS — Z369 Encounter for antenatal screening, unspecified: Secondary | ICD-10-CM

## 2021-06-04 DIAGNOSIS — O99280 Endocrine, nutritional and metabolic diseases complicating pregnancy, unspecified trimester: Secondary | ICD-10-CM

## 2021-06-04 DIAGNOSIS — Z3A24 24 weeks gestation of pregnancy: Secondary | ICD-10-CM

## 2021-06-04 DIAGNOSIS — Z3400 Encounter for supervision of normal first pregnancy, unspecified trimester: Secondary | ICD-10-CM

## 2021-06-04 DIAGNOSIS — E039 Hypothyroidism, unspecified: Secondary | ICD-10-CM

## 2021-06-04 LAB — POCT URINALYSIS DIPSTICK OB
Glucose, UA: NEGATIVE
POC,PROTEIN,UA: NEGATIVE

## 2021-06-04 MED ORDER — ONDANSETRON 4 MG PO TBDP: 4.0000 mg | ORAL_TABLET | Freq: Four times a day (QID) | ORAL | 2 refills | Status: DC | PRN

## 2021-06-04 NOTE — Progress Notes (Signed)
Routine Prenatal Care Visit  Subjective  Debra [name] is a 25 y.o. G1P0000 at [redacted]w[redacted]d being seen today for ongoing prenatal care.  She is currently monitored for the following issues for this low-risk pregnancy and has H/O Graves' disease; Postoperative hypothyroidism; Chest pain; S/P total thyroidectomy; Chronic constipation; Psoriasis of scalp; GAD (generalized anxiety disorder); Hypothyroidism affecting pregnancy, antepartum; Supervision of normal first pregnancy, antepartum; and Vanishing twin syndrome on their problem list.  ----------------------------------------------------------------------------------- Patient reports nausea and vomiting.   Contractions: Not present. Vag. Bleeding: None.  Movement: Present. Denies leaking of fluid.  ----------------------------------------------------------------------------------- The following portions of the patient's history were reviewed and updated as appropriate: allergies, current medications, past family history, past medical history, past social history, past surgical history and problem list. Problem list updated.   Objective  Last menstrual period 12/16/2020. Pregravid weight 185 lb (83.9 kg) Total Weight Gain 16 lb (7.258 kg) Urinalysis:      Fetal Status: Fetal Heart Rate (bpm): 145 Fundal Height: 24 cm Movement: Present     General:  Alert, oriented and cooperative. Patient is in no acute distress.  Skin: Skin is warm and dry. No rash noted.   Cardiovascular: Normal heart rate noted  Respiratory: Normal respiratory effort, no problems with respiration noted  Abdomen: Soft, gravid, appropriate for gestational age. Pain/Pressure: Absent     Pelvic:  Cervical exam deferred        Extremities: Normal range of motion.     ental Status: Normal mood and affect. Normal behavior. Normal judgment and thought content.     Assessment   25 y.o. G1P0000 at [redacted]w[redacted]d by  09/22/2021, by Last Menstrual Period presenting for routine prenatal  visit  Plan   Pregnancy 1 Problems (from 01/31/21 to present)     Problem Noted Resolved   Vanishing twin syndrome 02/27/2021 by Vena Austria, MD No   Hypothyroidism affecting pregnancy, antepartum 01/31/2021 by Vena Austria, MD No   Overview Signed 02/27/2021  5:34 PM by Vena Austria, MD    qTrimester thyroid panel      Supervision of normal first pregnancy, antepartum 01/31/2021 by Vena Austria, MD No   Overview Addendum 03/09/2021  8:17 AM by Vena Austria, MD     Nursing Staff Provider  Office Location  [facility] Dating  [redacted]w[redacted]d Ultrasound  Language  English Anatomy US    Flu Vaccine   Genetic Screen  NIPS: Normal XY  TDaP vaccine    Hgb A1C or  GTT Early : N/A Third trimester :   Covid    LAB RESULTS   Rhogam  N/A Blood Type A/Positive/-- (06/15 1117)   Feeding Plan  Antibody Negative (06/15 1117)  Contraception  Rubella 2.60 (06/15 1117)  Circumcision  RPR Non Reactive (06/15 1117)   Pediatrician   HBsAg Negative (06/15 1117)   Support Person  HIV Non Reactive (06/15 1117)  Prenatal Classes  Varicella Immune    GBS  (For PCN allergy, check sensitivities)   BTL Consent N/A    VBAC Consent N/A Pap 03/27/2020 NILM    Hgb Electro      CF Negative     SMA Negative    Fragile-X Negative             Gestational age appropriate obstetric precautions including but not limited to vaginal bleeding, contractions, leaking of fluid and fetal movement were reviewed in detail with the patient.    - 28 week labs and repeat thyroid panel next visit  Return in  about 4 weeks (around 07/02/2021) for ROB and 28 week labs.  Vena Austria, MD, [facility], [facility] 06/04/2021, 2:10 PM

## 2021-06-04 NOTE — Progress Notes (Signed)
ROB - nausea. RM 4

## 2021-06-05 ENCOUNTER — Other Ambulatory Visit: Payer: Self-pay | Admitting: Obstetrics and Gynecology

## 2021-06-05 DIAGNOSIS — O99280 Endocrine, nutritional and metabolic diseases complicating pregnancy, unspecified trimester: Secondary | ICD-10-CM

## 2021-06-05 DIAGNOSIS — Z3400 Encounter for supervision of normal first pregnancy, unspecified trimester: Secondary | ICD-10-CM

## 2021-06-05 DIAGNOSIS — O43122 Velamentous insertion of umbilical cord, second trimester: Secondary | ICD-10-CM

## 2021-06-05 DIAGNOSIS — E039 Hypothyroidism, unspecified: Secondary | ICD-10-CM

## 2021-06-05 NOTE — Progress Notes (Unsigned)
Velamentous vs battledore cord insertion will arrange MFM referral

## 2021-06-07 ENCOUNTER — Other Ambulatory Visit: Payer: Self-pay

## 2021-06-07 ENCOUNTER — Ambulatory Visit (HOSPITAL_BASED_OUTPATIENT_CLINIC_OR_DEPARTMENT_OTHER): Payer: BC Managed Care – PPO | Admitting: Maternal & Fetal Medicine

## 2021-06-07 ENCOUNTER — Other Ambulatory Visit: Payer: Self-pay | Admitting: Obstetrics and Gynecology

## 2021-06-07 ENCOUNTER — Ambulatory Visit: Payer: BC Managed Care – PPO | Attending: Maternal & Fetal Medicine

## 2021-06-07 DIAGNOSIS — Z3689 Encounter for other specified antenatal screening: Secondary | ICD-10-CM

## 2021-06-07 DIAGNOSIS — Z3A24 24 weeks gestation of pregnancy: Secondary | ICD-10-CM | POA: Diagnosis not present

## 2021-06-07 DIAGNOSIS — O99282 Endocrine, nutritional and metabolic diseases complicating pregnancy, second trimester: Secondary | ICD-10-CM

## 2021-06-07 DIAGNOSIS — E0590 Thyrotoxicosis, unspecified without thyrotoxic crisis or storm: Secondary | ICD-10-CM | POA: Diagnosis not present

## 2021-06-07 DIAGNOSIS — O0990 Supervision of high risk pregnancy, unspecified, unspecified trimester: Secondary | ICD-10-CM

## 2021-06-07 DIAGNOSIS — O43122 Velamentous insertion of umbilical cord, second trimester: Secondary | ICD-10-CM | POA: Diagnosis not present

## 2021-06-07 DIAGNOSIS — Z363 Encounter for antenatal screening for malformations: Secondary | ICD-10-CM | POA: Diagnosis not present

## 2021-06-07 DIAGNOSIS — E669 Obesity, unspecified: Secondary | ICD-10-CM

## 2021-06-07 DIAGNOSIS — E039 Hypothyroidism, unspecified: Secondary | ICD-10-CM | POA: Insufficient documentation

## 2021-06-07 DIAGNOSIS — O99212 Obesity complicating pregnancy, second trimester: Secondary | ICD-10-CM

## 2021-06-07 NOTE — Progress Notes (Signed)
MATERNAL FETAL MEDICINE CONSULT  Patient Name: Debra [name] Medical Record Number: [id number] Date of Birth: 1995/09/05  Requesting Physician Name: Vena Austria, MD Date of Service: 06/07/21   Chief Condition Ultrasound finding for velamentous cord insertion.  History of Present Illness  Debra [name] is a 25 y.o. G1P0000 at [redacted]w[redacted]d  with an EDD of Estimated Date of Delivery: 09/22/21 by LMP consistent with a [redacted]w[redacted]d ultrasound.  She is overall doing well without complaints. She denies s/sx or preterm labor or preeclampsia.   Her pregnancy issues include:  1)  Prenatal care has been uncomplicated. She had a low risk Mat 21 and a neg carrier screening for CF, SMA and Fragile X  2) s/p thyroidectomy for Graves diseases.  She is surgically hypothyroid with normal TFT's pm 09/06.  She denies s/sx of hypothyroidism.   Currently managed with levothyroxine 112 mcg daily. 3) Velamentous cord insertion seen on outside ultrasound.  [place] report on 10/14 noted  IMPRESSION: 1. Single live intrauterine gestation in cephalic presentation. 2. Assigned gestational age of [redacted] weeks 6 days. Adequate interval growth. 3. Adequate visualization of the fetal nose/upper lip and abdominal cord insertion site. 4. The umbilical cord is visualized inserting along the peripheral margin of the placenta raising the possibility of a velamentous cord insertion. 5. Mild left renal pyelectasis, likely transient/incidental. Growth   Vitals with BMI 06/07/2021 06/04/2021 05/07/2021  Height 5\' 9"  - -  Weight 201 lbs 201 lbs 196 lbs  BMI 29.67 - -  Systolic 108 112  Diastolic 64 68 72  Pulse 82 - -   CBC Latest Ref Rng & Units 01/31/2021 05/30/2020 07/15/2018  WBC 3.4 - 10.8 x10E3/uL 7.5 9.3 7.6  Hemoglobin 11.1 - 15.9 g/dL 07/17/2018 56.3 14.9  Hematocrit 34.0 - 46.6 % 41.3 40.9 42.3  Platelets 150 - 450 x10E3/uL 382 369 366   CMP Latest Ref Rng & Units 05/30/2020 07/15/2018 01/10/2015  Glucose 65 - 99 mg/dL 81  98 -  BUN 6 - 20 mg/dL 10 9 -  Creatinine 01/12/2015 - 1.00 mg/dL 6.37 8.58 -  Sodium 8.50 - 144 mmol/L 140 138 -  Potassium 3.5 - 5.2 mmol/L 4.0 3.2(L) -  Chloride 96 - 106 mmol/L 101 101 -  CO2 20 - 29 mmol/L 26 27 -  Calcium 8.7 - 10.2 mg/dL 9.8 277) -  Total Protein 6.0 - 8.5 g/dL 7.0 7.6 7.0  Total Bilirubin 0.0 - 1.2 mg/dL 0.3 0.9 0.6  Alkaline Phos 44 - 121 IU/L 70 46 70  AST 0 - 40 IU/L 13 20 24   ALT 0 - 32 IU/L 7 16 38(H)   Recent Results (from the past 2160 hour(s))  POC Urinalysis Dipstick OB     Status: Normal   Collection Time: 03/29/21 10:30 AM  Result Value Ref Range   Color, UA     Clarity, UA     Glucose, UA Negative Negative   Bilirubin, UA     Ketones, UA     Spec Grav, UA     Blood, UA     pH, UA     POC,PROTEIN,UA Negative Negative, Trace, Small (1+), Moderate (2+), Large (3+), 4+   Urobilinogen, UA     Nitrite, UA     Leukocytes, UA     Appearance     Odor    POC Urinalysis Dipstick OB     Status: Normal   Collection Time: 04/24/21  2:07 PM  Result Value Ref Range   Color, UA  Clarity, UA     Glucose, UA Negative Negative   Bilirubin, UA     Ketones, UA     Spec Grav, UA     Blood, UA     pH, UA     POC,PROTEIN,UA Negative Negative, Trace, Small (1+), Moderate (2+), Large (3+), 4+   Urobilinogen, UA     Nitrite, UA     Leukocytes, UA     Appearance     Odor    Thyroid Panel With TSH     Status: Abnormal   Collection Time: 04/24/21  2:44 PM  Result Value Ref Range   TSH 2.010 0.450 - 4.500 uIU/mL   T4, Total 13.5 (H) 4.5 - 12.0 ug/dL   T3 Uptake Ratio 18 (L) 24 - 39 %   Free Thyroxine Index 2.4 1.2 - 4.9  POC Urinalysis Dipstick OB     Status: Normal   Collection Time: 05/07/21 10:16 AM  Result Value Ref Range   Color, UA     Clarity, UA     Glucose, UA Negative Negative   Bilirubin, UA     Ketones, UA     Spec Grav, UA     Blood, UA     pH, UA     POC,PROTEIN,UA Negative Negative, Trace, Small (1+), Moderate (2+), Large (3+), 4+    Urobilinogen, UA     Nitrite, UA     Leukocytes, UA     Appearance     Odor    POC Urinalysis Dipstick OB     Status: Normal   Collection Time: 06/04/21  2:18 PM  Result Value Ref Range   Color, UA     Clarity, UA     Glucose, UA Negative Negative   Bilirubin, UA     Ketones, UA     Spec Grav, UA     Blood, UA     pH, UA     POC,PROTEIN,UA Negative Negative, Trace, Small (1+), Moderate (2+), Large (3+), 4+   Urobilinogen, UA     Nitrite, UA     Leukocytes, UA     Appearance     Odor         Review of Systems   Pertinent items are noted in HPI.  Patient History   OB History     Gravida  1   Para  0   Term  0   Preterm  0   AB  0   Living  0      SAB  0   IAB  0   Ectopic  0   Multiple  0   Live Births              Past Medical History:  Diagnosis Date   Allergy    Anxiety    Depression    History of irregular menstrual cycles    Irregular menses    Thyroid disease     Past Surgical History:  Procedure Laterality Date   TONSILLECTOMY AND ADENOIDECTOMY  2012   TOTAL THYROIDECTOMY       Social History   Socioeconomic History   Marital status: Married    Spouse name: Molli Hazard   Number of children: 0   Years of education: Not on file   Highest education level: Not on file  Occupational History   Occupation: Engineering geologist of [employer]  Tobacco Use   Smoking status: Never   Smokeless tobacco: Never  Vaping Use   Vaping  Use: Never used  Substance and Sexual Activity   Alcohol use: Not Currently    Comment: rarely   Drug use: No   Sexual activity: Yes    Partners: Male    Birth control/protection: Pill  Other Topics Concern   Not on file  Social History Narrative   Not on file   Social Determinants of Health   Financial Resource Strain: Not on file  Food Insecurity: Not on file  Transportation Needs: Not on file  Physical Activity: Not on file  Stress: Not on file  Social Connections: Not on file  Intimate  Partner Violence: Not on file     Physical Examination   Physical Exam   Ultrasound results:  Single intrauterine pregnancy here for a detailed anatomy suspected velamentous cord insertion with history of maternal graves disease. Normal anatomy with measurements consistent with dates There is good fetal movement and amniotic fluid volume Suboptimal views of the fetal anatomy were obtained secondary to fetal position and advanced gestational age.  The velamentous cord insertion was not definitively seen today due to fetal position. EFW 29% EFW 697 g normal AFI and FHR.   Assessment and Recommendations   1) Hypothroidism s/p total thyroidectomy.  Hypothyroidism can have adverse effects on pregnancy outcomes, depending upon the severity of the biochemical abnormalities. Poorly controlled hypothyroidism has been associated with an increased risk of several complications, including: preeclampsia and gestational hypertension, placental abruption, nonreassuring fetal heart rate tracing, peterm delivery, including very preterm delivery (before 32 weeks), increased rate of cesarean section, perinatal morbidity and mortality, neuropsychological and cognitive impairment and postpartum hemorrhage.   The goal TSH ranges with therapy are: first trimester 0.26-2.66; second trimester 0.55-2.73; and third trimester 0.43-2.91 miU/L).  For those who have not yet reached euthyroid status, we  recommend serum TSH values repeated at 4 week intervals after changes in medication.  I also stated that an increase in thyroid requirements as seen in approximately 30% of patient's during the second half of pregnancy.  Immediately after delivery, most patients should return to their prepregnancy dosage.   Your patient states that she is presently being medicated with synthroid.  A review of the patient's most recent labs demonstrated euthyroid status. Given this, we recommend continuing her current dosage with serial  monitoring of TFT each trimester.   In addition, Ms. Pain is hypothyroid due to a prior history of Graves disease. She presents with no signs or symptoms of graves disease. I discussed that there are cases of post definitive treatment mothers that are euthyroid can develop neonatal graves disease due to active circulating thyroid stimulating hormone receptor antibodies. These antibodies can cross the placenta and stimulate the fetal thyroid resulting in Neonatal Graves disease. Most case this disorder is not present until after 26 weeks. Risk assessment includes determination of the presence of these antibodies should be assessed between 26-28 weeks. Currently the FHR is normal and no indication of the graves disease is noted.  If the TSI's are elevated (> 500 %)  we recommend weekly testing and serial growth as FHR > 160 persistently, fetal goiter, fetal growth restriction and craniosynostosis is present. If a persistent fetal heart rates is noted   2) Velamentous cord insertion  The placental insertion of the umbilical cord (PCI) may occur centrally into the placental disk, eccentrically at the margin, or into the membranes beyond the margin of the placenta (so-called velamentous insertion, or VCI). The developmental dynamics that determine the directions of growth of the placental  disk and the relative point of insertion of the umbilical vessels as the placental disk expands are poorly documented and understood.  Based on first-trimester ultrasound observations, VCI appears to be a very early occurrence. VCI occurs in approximately 1%-4% of singletons.  VCI is not associated with fetal structural anomalies.  VCI increases risk of umbilical cord avulsion during placental delivery. Central and marginal PCI have no associations with abnormal fetal outcome. VCI has been associated with low birth weight, prematurity, and abnormal fetal heart patterns in labor.  Given the association with fetal growth  delays I recommend serial growth exam every 4-6 weeks. Carefulness recommended with removal of the placenta after delivery.  Again I reiterated that due to fetal position, the placental cord insertion was not definitively seen.    Recommendations: Continue serial growth every 4 weeks Assess maternal thryoid receptor hormone antibodies between 26-28 weeks.  If TRHAb >500% consider weekly testing. All questions answered.  I spent 60 minutes with > 50% in face to face consultation  Georgana Curio, MD

## 2021-07-02 ENCOUNTER — Other Ambulatory Visit: Payer: Self-pay

## 2021-07-02 ENCOUNTER — Ambulatory Visit (INDEPENDENT_AMBULATORY_CARE_PROVIDER_SITE_OTHER): Payer: BC Managed Care – PPO | Admitting: Obstetrics and Gynecology

## 2021-07-02 ENCOUNTER — Other Ambulatory Visit: Payer: BC Managed Care – PPO

## 2021-07-02 VITALS — BP 112/68 | Wt 210.0 lb

## 2021-07-02 DIAGNOSIS — O43129 Velamentous insertion of umbilical cord, unspecified trimester: Secondary | ICD-10-CM

## 2021-07-02 DIAGNOSIS — Z23 Encounter for immunization: Secondary | ICD-10-CM | POA: Diagnosis not present

## 2021-07-02 DIAGNOSIS — Z369 Encounter for antenatal screening, unspecified: Secondary | ICD-10-CM

## 2021-07-02 DIAGNOSIS — O99280 Endocrine, nutritional and metabolic diseases complicating pregnancy, unspecified trimester: Secondary | ICD-10-CM

## 2021-07-02 DIAGNOSIS — Z3A28 28 weeks gestation of pregnancy: Secondary | ICD-10-CM

## 2021-07-02 DIAGNOSIS — Z3400 Encounter for supervision of normal first pregnancy, unspecified trimester: Secondary | ICD-10-CM

## 2021-07-02 DIAGNOSIS — Z8639 Personal history of other endocrine, nutritional and metabolic disease: Secondary | ICD-10-CM

## 2021-07-02 DIAGNOSIS — O43123 Velamentous insertion of umbilical cord, third trimester: Secondary | ICD-10-CM

## 2021-07-02 DIAGNOSIS — E039 Hypothyroidism, unspecified: Secondary | ICD-10-CM

## 2021-07-02 LAB — POCT URINALYSIS DIPSTICK OB
Glucose, UA: NEGATIVE
POC,PROTEIN,UA: NEGATIVE

## 2021-07-02 NOTE — Progress Notes (Signed)
ROB - 1 hr gtt, not feeling much movement. RM 4

## 2021-07-02 NOTE — Progress Notes (Signed)
Routine Prenatal Care Visit  Subjective  Debra [name] is a 25 y.o. G1P0000 at [redacted]w[redacted]d being seen today for ongoing prenatal care.  She is currently monitored for the following issues for this high-risk pregnancy and has H/O Graves' disease; Postoperative hypothyroidism; Chest pain; S/P total thyroidectomy; Chronic constipation; Psoriasis of scalp; GAD (generalized anxiety disorder); Hypothyroidism affecting pregnancy, antepartum; Supervision of normal first pregnancy, antepartum; Vanishing twin syndrome; and Velamentous insertion of umbilical cord on their problem list.  ----------------------------------------------------------------------------------- Patient reports no complaints.   Contractions: Not present. Vag. Bleeding: None.  Movement: Present. Denies leaking of fluid.  ----------------------------------------------------------------------------------- The following portions of the patient's history were reviewed and updated as appropriate: allergies, current medications, past family history, past medical history, past social history, past surgical history and problem list. Problem list updated.   Objective  Blood pressure 112/68, weight 210 lb (95.3 kg), last menstrual period 12/16/2020. Pregravid weight 185 lb (83.9 kg) Total Weight Gain 25 lb (11.3 kg) Urinalysis:      Fetal Status: Fetal Heart Rate (bpm): 130 Fundal Height: 27 cm Movement: Present     General:  Alert, oriented and cooperative. Patient is in no acute distress.  Skin: Skin is warm and dry. No rash noted.   Cardiovascular: Normal heart rate noted  Respiratory: Normal respiratory effort, no problems with respiration noted  Abdomen: Soft, gravid, appropriate for gestational age. Pain/Pressure: Absent     Pelvic:  Cervical exam deferred        Extremities: Normal range of motion.     ental Status: Normal mood and affect. Normal behavior. Normal judgment and thought content.     Assessment   25 y.o.  G1P0000 at [redacted]w[redacted]d by  09/22/2021, by Last Menstrual Period presenting for routine prenatal visit  Plan   Pregnancy 1 Problems (from 01/31/21 to present)     Problem Noted Resolved   Velamentous insertion of umbilical cord 07/02/2021 by Vena Austria, MD No   Vanishing twin syndrome 02/27/2021 by Vena Austria, MD No   Hypothyroidism affecting pregnancy, antepartum 01/31/2021 by Vena Austria, MD No   Overview Signed 02/27/2021  5:34 PM by Vena Austria, MD    qTrimester thyroid panel      Supervision of normal first pregnancy, antepartum 01/31/2021 by Vena Austria, MD No   Overview Addendum 03/09/2021  8:17 AM by Vena Austria, MD     Nursing Staff Provider  Office Location  [facility] Dating  [redacted]w[redacted]d Ultrasound  Language  English Anatomy US    Flu Vaccine   Genetic Screen  NIPS: Normal XY  TDaP vaccine    Hgb A1C or  GTT Early : N/A Third trimester :   Covid    LAB RESULTS   Rhogam  N/A Blood Type A/Positive/-- (06/15 1117)   Feeding Plan  Antibody Negative (06/15 1117)  Contraception  Rubella 2.60 (06/15 1117)  Circumcision  RPR Non Reactive (06/15 1117)   Pediatrician   HBsAg Negative (06/15 1117)   Support Person  HIV Non Reactive (06/15 1117)  Prenatal Classes  Varicella Immune    GBS  (For PCN allergy, check sensitivities)   BTL Consent N/A    VBAC Consent N/A Pap 03/27/2020 NILM    Hgb Electro      CF Negative     SMA Negative    Fragile-X Negative             Gestational age appropriate obstetric precautions including but not limited to vaginal bleeding, contractions, leaking of fluid and  fetal movement were reviewed in detail with the patient.    - growth scan ordered with MFM - 28 week labs along with thyroid panel and TSI - has follow up with endocrinology 07/04/2021   Return in about 2 weeks (around 07/16/2021) for ROB.  Vena Austria, MD, [facility], [facility] 07/02/2021, 10:12 AM

## 2021-07-03 ENCOUNTER — Other Ambulatory Visit: Payer: Self-pay

## 2021-07-03 DIAGNOSIS — O99280 Endocrine, nutritional and metabolic diseases complicating pregnancy, unspecified trimester: Secondary | ICD-10-CM

## 2021-07-03 DIAGNOSIS — E039 Hypothyroidism, unspecified: Secondary | ICD-10-CM

## 2021-07-03 DIAGNOSIS — O43123 Velamentous insertion of umbilical cord, third trimester: Secondary | ICD-10-CM

## 2021-07-03 DIAGNOSIS — O0990 Supervision of high risk pregnancy, unspecified, unspecified trimester: Secondary | ICD-10-CM

## 2021-07-03 LAB — 28 WEEK RH+PANEL
Basophils Absolute: 0 10*3/uL (ref 0.0–0.2)
Basos: 0 %
EOS (ABSOLUTE): 0.2 10*3/uL (ref 0.0–0.4)
Eos: 1 %
Gestational Diabetes Screen: 76 mg/dL (ref 70–139)
HIV Screen 4th Generation wRfx: NONREACTIVE
Hematocrit: 35 % (ref 34.0–46.6)
Hemoglobin: 11.9 g/dL (ref 11.1–15.9)
Immature Grans (Abs): 0.1 10*3/uL (ref 0.0–0.1)
Immature Granulocytes: 1 %
Lymphocytes Absolute: 1.7 10*3/uL (ref 0.7–3.1)
Lymphs: 16 %
MCH: 31.9 pg (ref 26.6–33.0)
MCHC: 34 g/dL (ref 31.5–35.7)
MCV: 94 fL (ref 79–97)
Monocytes Absolute: 0.6 10*3/uL (ref 0.1–0.9)
Monocytes: 6 %
Neutrophils Absolute: 8.1 10*3/uL — ABNORMAL HIGH (ref 1.4–7.0)
Neutrophils: 76 %
Platelets: 308 10*3/uL (ref 150–450)
RBC: 3.73 x10E6/uL — ABNORMAL LOW (ref 3.77–5.28)
RDW: 11.8 % (ref 11.7–15.4)
RPR Ser Ql: NONREACTIVE
WBC: 10.6 10*3/uL (ref 3.4–10.8)

## 2021-07-03 LAB — THYROID STIMULATING IMMUNOGLOBULIN: Thyroid Stim Immunoglobulin: 2.17 IU/L — ABNORMAL HIGH (ref 0.00–0.55)

## 2021-07-03 LAB — TSH+FREE T4
Free T4: 1.06 ng/dL (ref 0.82–1.77)
TSH: 2.91 u[IU]/mL (ref 0.450–4.500)

## 2021-07-04 ENCOUNTER — Ambulatory Visit (INDEPENDENT_AMBULATORY_CARE_PROVIDER_SITE_OTHER): Payer: BC Managed Care – PPO | Admitting: Internal Medicine

## 2021-07-04 ENCOUNTER — Encounter: Payer: Self-pay | Admitting: Internal Medicine

## 2021-07-04 ENCOUNTER — Other Ambulatory Visit: Payer: Self-pay

## 2021-07-04 VITALS — BP 120/78 | HR 102 | Ht 69.0 in | Wt 209.6 lb

## 2021-07-04 DIAGNOSIS — E559 Vitamin D deficiency, unspecified: Secondary | ICD-10-CM | POA: Diagnosis not present

## 2021-07-04 DIAGNOSIS — E890 Postprocedural hypothyroidism: Secondary | ICD-10-CM | POA: Diagnosis not present

## 2021-07-04 DIAGNOSIS — E538 Deficiency of other specified B group vitamins: Secondary | ICD-10-CM

## 2021-07-04 DIAGNOSIS — Z8639 Personal history of other endocrine, nutritional and metabolic disease: Secondary | ICD-10-CM | POA: Diagnosis not present

## 2021-07-04 MED ORDER — LEVOTHYROXINE SODIUM 112 MCG PO TABS: ORAL_TABLET | ORAL | 3 refills | Status: DC

## 2021-07-04 NOTE — Progress Notes (Signed)
Patient ID: Debra [name], female   DOB: Aug 21, 1995, 25 y.o.   MRN: [id number]   This visit occurred during the SARS-CoV-2 public health emergency.  Safety protocols were in place, including screening questions prior to the visit, additional usage of staff PPE, and extensive cleaning of exam room while observing appropriate contact time as indicated for disinfecting solutions.   HPI  Debra [name] is a 25 y.o.-year-old female, returning for follow up for h/o uncontrolled Graves ds, now with postsurgical hypothyroidism. Last visit 7 months ago.  Interim history: At last visit, she was planning to get pregnant.  She stopped Yaz in 09/2020 and started prenatal vitamins. At this visit, she is pregnant at [redacted] weeks along. EDD 09/22/2021. She still has nausea. Also, acid reflux.  Reviewed history: She started to have mood swings and anxiety episodes in 2014.  She then developed heat intolerance, tremors, palpitations, fatigue.  Thyroid US, [facility] (12/30/14) - normal, except heterogeneity: Right thyroid lobe Measurements: 4.0 x 1.3 x 1.3 cm. No nodules visualized. Mildly heterogeneous tissue.   Left thyroid lobe Measurements: 4.3 x 1.0 x 1.3 cm. No nodules visualized. Mildly heterogeneous tissue.   Isthmus Thickness: 3 mm.  No nodules visualized.   Lymphadenopathy: None visualized.   IMPRESSION: No evidence of thyroid nodule. Mildly heterogeneous glandular tissue. Normal size.  Thyroid Uptake and scan (01/13/2015): The thyroid scan demonstrates fairly symmetric and homogeneous uptake in the thyroid gland. No hot or cold nodules are demonstrated.   4 hour I 131 uptake = 51.2% (normal 5-20%)   24 hour I 131 uptake = 55.8% (normal 10-30%)   IMPRESSION: Elevated 4 and 24 hr iodine 123 uptake consistent with hyperthyroidism/Graves disease.  She started MMI 10 mg in am in 12/2014 >> we decreased the dose to 5 mg 2x a day, as her TFTs were improving in 02/2015, however, in 04/2015, her  TFTs were still abnormal >> increased MMI to 10 mg 2x a day >>  20-10-20 mg >> 20 mg 3x a day in 12/2015.  Despite this, her TFTs remained abnormal.   She repeatedly refused RAI treatment and conventional thyroidectomy.  I then suggested transaxillary thyroidectomy, which she had at [facility] 07/29/2016 (Dr. Lovie Macadamia) >> she developed postop hypothyroidism.  Pt is on levothyroxine 112 mcg x9 tablets a week (while pregnant), taken: - in am - fasting - + b'fast later - no Ca, Fe, PPIs - + prenatal vitamins at night - not on Biotin  Reviewed patient's TFTs normal: Lab Results  Component Value Date   TSH 2.910 07/02/2021   TSH 2.010 04/24/2021   TSH 1.780 02/27/2021   TSH 0.516 01/31/2021   TSH 1.21 12/12/2020   TSH 1.660 05/30/2020   TSH 1.33 12/14/2019   TSH 0.52 12/15/2018   TSH 1.83 09/18/2017   TSH 3.37 03/17/2017   FREET4 1.06 07/02/2021   FREET4 1.25 02/27/2021   FREET4 1.60 01/31/2021   FREET4 1.2 12/12/2020   FREET4 1.3 12/14/2019   FREET4 1.5 12/15/2018   FREET4 1.13 09/18/2017   FREET4 1.18 03/17/2017   FREET4 2.03 (H) 12/19/2015   FREET4 1.39 10/25/2015   TSI's: Component     Latest Ref Rng & Units 07/02/2021  Thyroid Stim Immunoglobulin     0.00 - 0.55 IU/L 2.17 (H)   Lab Results  Component Value Date   TSI >700 (H) 03/17/2017   TSI 588 (H) 01/10/2015  07/22/2019: TSH 0.712 11/06/2016: TRAb 9.35 (0-1.75), TSH 20.15 01/10/2015: TSI 588 12/08/14: TSH 0.006, TT4 11.5,  T3U 33%,FTI 3.8 TgAB<1 Remainder panel was normal for E2, Progesterone, PRL, testosterone.  Pt denies: - feeling nodules in neck - hoarseness - dysphagia - choking - SOB with lying down  Pt does have a FH of thyroid ds. - hyper- or hypo-thyroidism in GGM. No FH of thyroid cancer. No h/o radiation tx to head or neck.  No herbal supplements. No Biotin use. No recent steroids use.   Vitamin D level was slightly low: Lab Results  Component Value Date   VD25OH 29 (L) 12/12/2020   VD25OH 25  (L) 12/14/2019  We started 2000 units vitamin D daily.  However, at last visit she was only taking a prenatal vitamin - with 400 units vitamin D daily.  I advised her to add 1000 units daily.  She also had a slightly low vitamin B12: Lab Results  Component Value Date   VITAMINB12 373 12/12/2020   VITAMINB12 283 12/14/2019  We started 1000 mcg vitamin B12 daily.  However, she is only taking 8 mcg B12 daily now in her prenatal vitamins.  On this dose, vitamin D B12 was normal at last visit  She got married in 2019.   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 She had anxiety related to teaching. Now off Celexa and Buspar before last visit.  ROS: + see HPI  I reviewed pt's medications, allergies, PMH, social hx, family hx, and changes were documented in the history of present illness. Otherwise, unchanged from my initial visit note.  Past Medical History:  Diagnosis Date   Allergy    Anxiety    Depression    History of irregular menstrual cycles    Irregular menses    Thyroid disease    Past Surgical History:  Procedure Laterality Date   TONSILLECTOMY AND ADENOIDECTOMY  2012   TOTAL THYROIDECTOMY     History   Social History   Marital Status: Single    Spouse Name: N/A   Number of Children: 0   Occupational History   student   Social History Main Topics   Smoking status: Never Smoker    Smokeless tobacco: Never Used   Alcohol Use: Yes   Drug Use: No   Current Outpatient Medications on File Prior to Visit  Medication Sig Dispense Refill   citalopram (CELEXA) 20 MG tablet Take 1 tablet (20 mg total) by mouth daily. 90 tablet 3   clobetasol (OLUX) 0.05 % topical foam Apply topically 2 (two) times daily. 100 g 5   levothyroxine (SYNTHROID) 112 MCG tablet Take 1 tablet (112 mcg total) by mouth daily before breakfast. (Patient taking differently: Take 112 mcg by mouth daily before breakfast. Pt adjusts as necessary) 90 tablet 1   ondansetron  (ZOFRAN ODT) 4 MG disintegrating tablet Take 1 tablet (4 mg total) by mouth every 6 (six) hours as needed for nausea. 40 tablet 2   No current facility-administered medications on file prior to visit.   Allergies  Allergen Reactions   Sulfa Antibiotics Rash   Family History  Problem Relation Age of Onset   Hypertension Father    Gout Father    Hypertension Maternal Grandfather    Stroke Maternal Grandfather    Arthritis Paternal Grandmother    Gout Paternal Grandmother    Hypertension Paternal Grandmother    Melanoma Paternal Grandmother    Intellectual disability Paternal Grandfather    Heart Problems Paternal Grandfather        CABG x4   Thyroid disease  Other    PE: BP 120/78 (BP Location: Right Arm, Patient Position: Sitting, Cuff Size: Normal)   Pulse (!) 102   Ht 5\' 9"  (1.753 m)   Wt 209 lb 9.6 oz (95.1 kg)   LMP 12/16/2020 (Exact Date)   SpO2 97%   BMI 30.95 kg/m  Body mass index is 30.95 kg/m. Wt Readings from Last 3 Encounters:  07/04/21 209 lb 9.6 oz (95.1 kg)  07/02/21 210 lb (95.3 kg)  06/07/21 201 lb (91.2 kg)   Constitutional: Pregnant appearing, in NAD Eyes: PERRLA, EOMI, no exophthalmos ENT: moist mucous membranes, no thyromegaly, no cervical lymphadenopathy Cardiovascular: Tachycardia, RR, No MRG Respiratory: CTA B Gastrointestinal: abdomen soft, NT, ND, BS+ Musculoskeletal: no deformities, strength intact in all 4 Skin: moist, warm, no rashes Neurological: no tremor with outstretched hands, DTR normal in all 4  ASSESSMENT: 1. H/o Graves ds  2. Postop hypothyroidism  3.  Vitamin D insufficiency  4.  Low vitamin B12  PLAN:  1. Patient with history of severe Graves' disease, previously not responding well to methimazole, now status post transaxillary thyroidectomy by Dr. 06/09/21 at [facility] -No active signs of Graves' ophthalmopathy: No double vision, blurry vision, eye pain, chemosis. -We reviewed together her latest TSI level obtained by OB/GYN 2  days ago: elevated -We discussed about the effect of the TSI's on the baby after they across the placenta -she has an ultrasound coming out in 5 days.  I advised her to let me know about this so I can go and take the results. -Discussed about incidence and treatment of fetal of hyperthyroidism due to maternal Ab's  2. Postop hypothyroidism - latest thyroid labs reviewed with pt. >> normal 2 days ago: Lab Results  Component Value Date   TSH 2.910 07/02/2021  - she continues on LT4 112 mcg x 9 doses a week >> plan to go back to the 112 mcg daily after she gives birth. - pt feels good on this dose. - we discussed about taking the thyroid hormone every day, with water, >30 minutes before breakfast, separated by >4 hours from acid reflux medications, calcium, iron, multivitamins. Pt. is taking it correctly. -We will have her back for labs in 4 to 5 weeks after she gives birth and in 6 months for visit.  3.  Vitamin D insufficiency -In the past vitamin D level was slightly low, at 25 -We started vitamin D 2000 units daily but at last visit she was only getting 400 units in prenatal vitamins -At that time, I advised her to add a separate supplement of vitamin D 1000 units to the prenatal vitamins -We will repeat the level today  4.  Low vitamin B12 -We checked her vitamin B12 at last visit in the same context (please see problem #3) -I initially advised her to start 1000 mcg B12 daily but at last visit she was only getting this from multivitamins. -At last visit, level was normal, at 373 so we did not end up adding a B12 supplement to her prenatal vitamins -We will repeat a level today   Component     Latest Ref Rng & Units 07/04/2021  Vitamin B12     200 - 1,100 pg/mL 240  Vitamin D, 25-Hydroxy     30 - 100 ng/mL 25 (L)  Vitamin B12 is close to the lower limit of normal, while vitamin D is still low. For now I would suggest to add a B12 1000 mcg daily to her prenatal vitamins.  Also, I  will advise her to add 2000 units vitamin D daily to her prenatal vitamin.  Debra Pavlov, MD PhD [facility]

## 2021-07-04 NOTE — Patient Instructions (Addendum)
Please continue Levothyroxine 112 mcg 9x a week.  After you give birth, decrease Levothyroxine back to once a day.  Come back for labs ~4-5 weeks afterwards.  Take the thyroid hormone every day, with water, at least 30 minutes before breakfast, separated by at least 4 hours from: - acid reflux medications - calcium - iron - multivitamins  Please stop at the lab.  Please come back for a follow-up appointment in 6 months.

## 2021-07-05 LAB — VITAMIN B12: Vitamin B-12: 240 pg/mL (ref 200–1100)

## 2021-07-05 LAB — VITAMIN D 25 HYDROXY (VIT D DEFICIENCY, FRACTURES): Vit D, 25-Hydroxy: 25 ng/mL — ABNORMAL LOW (ref 30–100)

## 2021-07-09 ENCOUNTER — Ambulatory Visit: Payer: BC Managed Care – PPO | Admitting: *Deleted

## 2021-07-09 ENCOUNTER — Ambulatory Visit: Payer: BC Managed Care – PPO | Attending: Obstetrics and Gynecology

## 2021-07-09 ENCOUNTER — Other Ambulatory Visit: Payer: Self-pay

## 2021-07-09 VITALS — BP 132/68 | HR 86

## 2021-07-09 DIAGNOSIS — O3110X0 Continuing pregnancy after spontaneous abortion of one fetus or more, unspecified trimester, not applicable or unspecified: Secondary | ICD-10-CM

## 2021-07-09 DIAGNOSIS — O99213 Obesity complicating pregnancy, third trimester: Secondary | ICD-10-CM | POA: Diagnosis not present

## 2021-07-09 DIAGNOSIS — Z3A29 29 weeks gestation of pregnancy: Secondary | ICD-10-CM | POA: Diagnosis not present

## 2021-07-09 DIAGNOSIS — O43193 Other malformation of placenta, third trimester: Secondary | ICD-10-CM | POA: Diagnosis not present

## 2021-07-09 DIAGNOSIS — E039 Hypothyroidism, unspecified: Secondary | ICD-10-CM

## 2021-07-09 DIAGNOSIS — O99280 Endocrine, nutritional and metabolic diseases complicating pregnancy, unspecified trimester: Secondary | ICD-10-CM | POA: Diagnosis present

## 2021-07-09 DIAGNOSIS — Z362 Encounter for other antenatal screening follow-up: Secondary | ICD-10-CM | POA: Diagnosis not present

## 2021-07-09 DIAGNOSIS — E0590 Thyrotoxicosis, unspecified without thyrotoxic crisis or storm: Secondary | ICD-10-CM | POA: Diagnosis not present

## 2021-07-09 DIAGNOSIS — Z8639 Personal history of other endocrine, nutritional and metabolic disease: Secondary | ICD-10-CM

## 2021-07-09 DIAGNOSIS — O43123 Velamentous insertion of umbilical cord, third trimester: Secondary | ICD-10-CM

## 2021-07-09 DIAGNOSIS — O99283 Endocrine, nutritional and metabolic diseases complicating pregnancy, third trimester: Secondary | ICD-10-CM | POA: Diagnosis not present

## 2021-07-09 DIAGNOSIS — Z3400 Encounter for supervision of normal first pregnancy, unspecified trimester: Secondary | ICD-10-CM | POA: Diagnosis present

## 2021-07-09 DIAGNOSIS — Z369 Encounter for antenatal screening, unspecified: Secondary | ICD-10-CM | POA: Diagnosis present

## 2021-07-16 ENCOUNTER — Other Ambulatory Visit: Payer: Self-pay

## 2021-07-16 ENCOUNTER — Ambulatory Visit (INDEPENDENT_AMBULATORY_CARE_PROVIDER_SITE_OTHER): Payer: BC Managed Care – PPO | Admitting: Obstetrics and Gynecology

## 2021-07-16 VITALS — BP 118/68 | Wt 214.0 lb

## 2021-07-16 DIAGNOSIS — Z9089 Acquired absence of other organs: Secondary | ICD-10-CM

## 2021-07-16 DIAGNOSIS — Z3A30 30 weeks gestation of pregnancy: Secondary | ICD-10-CM

## 2021-07-16 DIAGNOSIS — Z3400 Encounter for supervision of normal first pregnancy, unspecified trimester: Secondary | ICD-10-CM

## 2021-07-16 DIAGNOSIS — O43123 Velamentous insertion of umbilical cord, third trimester: Secondary | ICD-10-CM

## 2021-07-16 DIAGNOSIS — Z8639 Personal history of other endocrine, nutritional and metabolic disease: Secondary | ICD-10-CM

## 2021-07-16 DIAGNOSIS — E890 Postprocedural hypothyroidism: Secondary | ICD-10-CM

## 2021-07-16 LAB — POCT URINALYSIS DIPSTICK OB
Glucose, UA: NEGATIVE
POC,PROTEIN,UA: NEGATIVE

## 2021-07-16 NOTE — Progress Notes (Signed)
ROB - lower abdomen pressure started today. RM 4

## 2021-07-16 NOTE — Progress Notes (Signed)
Routine Prenatal Care Visit  Subjective  Debra [name] is a 25 y.o. G1P0000 at [redacted]w[redacted]d being seen today for ongoing prenatal care.  She is currently monitored for the following issues for this low-risk pregnancy and has H/O Graves' disease; Postoperative hypothyroidism; Chest pain; S/P total thyroidectomy; Chronic constipation; Psoriasis of scalp; GAD (generalized anxiety disorder); Hypothyroidism affecting pregnancy, antepartum; Supervision of normal first pregnancy, antepartum; Vanishing twin syndrome; and Velamentous insertion of umbilical cord on their problem list.  ----------------------------------------------------------------------------------- Patient reports no complaints.    .  .   . Denies leaking of fluid.  ----------------------------------------------------------------------------------- The following portions of the patient's history were reviewed and updated as appropriate: allergies, current medications, past family history, past medical history, past social history, past surgical history and problem list. Problem list updated.   Objective  Blood pressure 118/68, weight 214 lb (97.1 kg), last menstrual period 12/16/2020. Pregravid weight 185 lb (83.9 kg) Total Weight Gain 25 lb (11.3 kg) Urinalysis:      Fetal Status:           General:  Alert, oriented and cooperative. Patient is in no acute distress.  Skin: Skin is warm and dry. No rash noted.   Cardiovascular: Normal heart rate noted  Respiratory: Normal respiratory effort, no problems with respiration noted  Abdomen: Soft, gravid, appropriate for gestational age.       Pelvic:  Cervical exam deferred        Extremities: Normal range of motion.     ental Status: Normal mood and affect. Normal behavior. Normal judgment and thought content.     Assessment   25 y.o. G1P0000 at [redacted]w[redacted]d by  09/22/2021, by Last Menstrual Period presenting for routine prenatal visit  Plan   Pregnancy 1 Problems (from 01/31/21 to  present)     Problem Noted Resolved   Velamentous insertion of umbilical cord 07/02/2021 by Vena Austria, MD No   Vanishing twin syndrome 02/27/2021 by Vena Austria, MD No   Hypothyroidism affecting pregnancy, antepartum 01/31/2021 by Vena Austria, MD No   Overview Signed 02/27/2021  5:34 PM by Vena Austria, MD    qTrimester thyroid panel      Supervision of normal first pregnancy, antepartum 01/31/2021 by Vena Austria, MD No   Overview Addendum 03/09/2021  8:17 AM by Vena Austria, MD     Nursing Staff Provider  Office Location  [facility] Dating  [redacted]w[redacted]d Ultrasound  Language  English Anatomy US  Marginal cord insertion, otherwise normal  Flu Vaccine   Genetic Screen  NIPS: Normal XY  TDaP vaccine    Hgb A1C or  GTT Early : N/A Third trimester : 76  Covid    LAB RESULTS   Rhogam  N/A Blood Type A/Positive/-- (06/15 1117)   Feeding Plan  Antibody Negative (06/15 1117)  Contraception  Rubella 2.60 (06/15 1117)  Circumcision  RPR Non Reactive (06/15 1117)   Pediatrician   HBsAg Negative (06/15 1117)   Support Person  HIV Non Reactive (06/15 1117)  Prenatal Classes  Varicella Immune    GBS  (For PCN allergy, check sensitivities)   BTL Consent N/A    VBAC Consent N/A Pap 03/27/2020 NILM    Hgb Electro      CF Negative     SMA Negative    Fragile-X Negative             Gestational age appropriate obstetric precautions including but not limited to vaginal bleeding, contractions, leaking of fluid and fetal movement were  reviewed in detail with the patient.    Return in about 2 weeks (around 07/30/2021) for ROB.  Vena Austria, MD, [facility], [facility] 07/16/2021, 2:18 PM

## 2021-07-19 ENCOUNTER — Other Ambulatory Visit: Payer: Self-pay

## 2021-07-19 ENCOUNTER — Encounter: Payer: Self-pay | Admitting: Obstetrics & Gynecology

## 2021-07-19 ENCOUNTER — Observation Stay
Admission: EM | Admit: 2021-07-19 | Discharge: 2021-07-19 | Disposition: A | Discharge status: Home / Self Care | Payer: BC Managed Care – PPO | Attending: Obstetrics & Gynecology | Admitting: Obstetrics & Gynecology

## 2021-07-19 DIAGNOSIS — Z3A30 30 weeks gestation of pregnancy: Secondary | ICD-10-CM | POA: Insufficient documentation

## 2021-07-19 DIAGNOSIS — N898 Other specified noninflammatory disorders of vagina: Secondary | ICD-10-CM | POA: Diagnosis present

## 2021-07-19 DIAGNOSIS — E039 Hypothyroidism, unspecified: Secondary | ICD-10-CM | POA: Insufficient documentation

## 2021-07-19 DIAGNOSIS — Z79899 Other long term (current) drug therapy: Secondary | ICD-10-CM | POA: Insufficient documentation

## 2021-07-19 DIAGNOSIS — O3110X0 Continuing pregnancy after spontaneous abortion of one fetus or more, unspecified trimester, not applicable or unspecified: Secondary | ICD-10-CM

## 2021-07-19 DIAGNOSIS — O99283 Endocrine, nutritional and metabolic diseases complicating pregnancy, third trimester: Secondary | ICD-10-CM | POA: Insufficient documentation

## 2021-07-19 DIAGNOSIS — O26893 Other specified pregnancy related conditions, third trimester: Secondary | ICD-10-CM | POA: Diagnosis not present

## 2021-07-19 DIAGNOSIS — O43123 Velamentous insertion of umbilical cord, third trimester: Secondary | ICD-10-CM

## 2021-07-19 DIAGNOSIS — Z3400 Encounter for supervision of normal first pregnancy, unspecified trimester: Secondary | ICD-10-CM

## 2021-07-19 LAB — URINALYSIS, ROUTINE W REFLEX MICROSCOPIC
Bilirubin Urine: NEGATIVE
Glucose, UA: NEGATIVE mg/dL
Hgb urine dipstick: NEGATIVE
Ketones, ur: NEGATIVE mg/dL
Leukocytes,Ua: NEGATIVE
Nitrite: NEGATIVE
Protein, ur: NEGATIVE mg/dL
Specific Gravity, Urine: 1.01 (ref 1.005–1.030)
pH: 6 (ref 5.0–8.0)

## 2021-07-19 LAB — WET PREP, GENITAL
Clue Cells Wet Prep HPF POC: NONE SEEN
Sperm: NONE SEEN
Trich, Wet Prep: NONE SEEN
WBC, Wet Prep HPF POC: <10 — AB (ref <10)
Yeast Wet Prep HPF POC: NONE SEEN

## 2021-07-19 LAB — RUPTURE OF MEMBRANE (ROM)PLUS: Rom Plus: NEGATIVE

## 2021-07-19 MED ORDER — ACETAMINOPHEN 325 MG PO TABS: 650.0000 mg | ORAL_TABLET | ORAL | Status: DC | PRN

## 2021-07-19 MED ORDER — ONDANSETRON HCL 4 MG/2ML IJ SOLN: 4.0000 mg | Freq: Four times a day (QID) | INTRAMUSCULAR | Status: DC | PRN

## 2021-07-19 MED ORDER — LIDOCAINE HCL (PF) 1 % IJ SOLN: 30.0000 mL | INTRAMUSCULAR | Status: DC | PRN

## 2021-07-19 NOTE — Progress Notes (Signed)
Discharge instructions provided to pt. Pt verbalizes understanding. Vaginal bleeding and discharge, contractions, and fetal movement reviewed by RN. Follow-up care reviewed. Pt discharged home with significant other and mother.

## 2021-07-19 NOTE — OB Triage Note (Signed)
Pt is a 25yo G1P0, 30w 5d. She arrived to the unit with complaints of leaking of fluid. Pt states that she has changed her underwear 3 times since this morning. Current pair of underwear was changed 15 minutes before arrival to the unit and silver dollar size amount is noted. She denies vaginal bleeding, reports positive fetal movement, and reports irregular contractions since Sunday. VS stable, monitors applied and assessing.   Initial FHT 160 at 1859.

## 2021-07-19 NOTE — Final Progress Note (Signed)
Physician Final Progress Note  Patient ID: Debra [name] MRN: [id number] DOB/AGE: 11-06-1995 25 y.o.  Admit date: 07/19/2021 Admitting provider: Nadara Mustard, MD Discharge date: 07/19/2021  Admission Diagnoses: Vaginal discharge 30 weeks Concern for PPROM  Discharge Diagnoses:  Principal Problem:   Vaginal discharge   No PPROM  Consults: None  Significant Findings/ Diagnostic Studies:  Obstetrics Admission History & Physical   Labor Eval   HPI:  25 y.o. G1P0000 @ [redacted]w[redacted]d (09/22/2021, by Last Menstrual Period). Admitted on 07/19/2021:   Patient Active Problem List   Diagnosis Date Noted   Vaginal discharge 07/19/2021   Velamentous insertion of umbilical cord 07/02/2021   Vanishing twin syndrome 02/27/2021   Hypothyroidism affecting pregnancy, antepartum 01/31/2021   Supervision of normal first pregnancy, antepartum 01/31/2021   Chronic constipation 03/31/2020   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   GAD (generalized anxiety disorder) 03/31/2020   Chest pain 03/17/2017   Postoperative hypothyroidism 08/09/2016   S/P total thyroidectomy 08/09/2016   H/O Graves' disease 01/10/2015     Presents for vaginal discharge today, concerned it may be her water broke.  No VB or contracetions or other pain.  No fever  Prenatal care at: at [facility]. Pregnancy complicated by none.  ROS: A review of systems was performed and negative, except as stated in the above HPI. PMHx:  Past Medical History:  Diagnosis Date   Allergy    Anxiety    Depression    History of irregular menstrual cycles    Irregular menses    Thyroid disease    PSHx:  Past Surgical History:  Procedure Laterality Date   TONSILLECTOMY AND ADENOIDECTOMY  2012   TOTAL THYROIDECTOMY     Medications:  Medications Prior to Admission  Medication Sig Dispense Refill Last Dose   citalopram (CELEXA) 20 MG tablet Take 1 tablet (20 mg total) by mouth daily. 90 tablet 3 07/19/2021   clobetasol (OLUX) 0.05 % topical  foam Apply topically 2 (two) times daily. 100 g 5 Past Week   levothyroxine (SYNTHROID) 112 MCG tablet Take 9 tablets a week by mouth as advised 135 tablet 3 07/19/2021   ondansetron (ZOFRAN ODT) 4 MG disintegrating tablet Take 1 tablet (4 mg total) by mouth every 6 (six) hours as needed for nausea. 40 tablet 2 Past Week   Allergies: is allergic to sulfa antibiotics. OBHx:  OB History  Gravida Para Term Preterm AB Living  1 0 0 0 0 0  SAB IAB Ectopic Multiple Live Births  0 0 0 0      # Outcome Date GA Lbr Len/2nd Weight Sex Delivery Anes PTL Lv  1 Current            XBJ:[id number] except as detailed in HPI.Marland Kitchen  No family history of birth defects. Soc Hx: Never smoker, Alcohol: none, and Recreational drug use: none  Objective:   Vitals:   07/19/21 1908  BP: 131/79  Pulse: 91  Resp: 16  Temp: 98.2 F (36.8 C)   Constitutional: Well nourished, well developed female in no acute distress.  HEENT: normal Skin: Warm and dry.  Cardiovascular:Regular rate and rhythm.   Extremity: trace to 1+ bilateral pedal edema Respiratory: Clear to auscultation bilateral. Normal respiratory effort Abdomen: gravid, NT ND, FHT present, without guarding, without rebound tenderness on exam Back: no CVAT Neuro: DTRs 2+, Cranial nerves grossly intact Psych: Alert and Oriented x3. No memory deficits. Normal mood and affect.  MS: normal gait, normal bilateral lower extremity ROM/strength/stability.  Pelvic exam:  is not limited by body habitus EGBUS: within normal limits Vagina: within normal limits and with normal mucosa Cervix: visibly not dilated POOL NEG SCANT DISCHARGE Uterus: No contractions observed for 30 minutes.  Adnexa: not evaluated  Assessment & Plan:   25 y.o. G1P0000 @ [redacted]w[redacted]d, Admitted on 07/19/2021:Vaginal discharge, neg for PPROM    Procedures: A NST procedure was performed with FHR monitoring and a normal baseline established, appropriate time of 20-40 minutes of  evaluation, and accels >2 seen w 15x15 characteristics.  Results show a REACTIVE NST.   Pooling Neg Fern Neg ROM PLUS - Neg  Discharge Condition: good  Disposition: Discharge disposition: 01-Home or Self Care       Diet: Regular diet  Discharge Activity: Activity as tolerated  Discharge Instructions     Call MD for:   Complete by: As directed    Worsening contractions or pain; leakage of fluid; bleeding.   Diet - low sodium heart healthy   Complete by: As directed    Increase activity slowly   Complete by: As directed       Allergies as of 07/19/2021       Reactions   Sulfa Antibiotics Rash        Medication List     TAKE these medications    citalopram 20 MG tablet Commonly known as: CELEXA Take 1 tablet (20 mg total) by mouth daily.   clobetasol 0.05 % topical foam Commonly known as: OLUX Apply topically 2 (two) times daily.   levothyroxine 112 MCG tablet Commonly known as: SYNTHROID Take 9 tablets a week by mouth as advised   ondansetron 4 MG disintegrating tablet Commonly known as: Zofran ODT Take 1 tablet (4 mg total) by mouth every 6 (six) hours as needed for nausea.         Total time spent taking care of this patient: 20 minutes  Signed: Letitia Libra 07/19/2021, 8:55 PM

## 2021-07-19 NOTE — Discharge Summary (Signed)
  See FPN 

## 2021-08-01 ENCOUNTER — Encounter: Payer: BC Managed Care – PPO | Admitting: Obstetrics and Gynecology

## 2021-08-02 ENCOUNTER — Other Ambulatory Visit: Payer: Self-pay

## 2021-08-02 ENCOUNTER — Telehealth: Payer: Self-pay | Admitting: Internal Medicine

## 2021-08-02 ENCOUNTER — Ambulatory Visit (INDEPENDENT_AMBULATORY_CARE_PROVIDER_SITE_OTHER): Payer: BC Managed Care – PPO | Admitting: Obstetrics and Gynecology

## 2021-08-02 ENCOUNTER — Other Ambulatory Visit: Payer: Self-pay | Admitting: Internal Medicine

## 2021-08-02 ENCOUNTER — Other Ambulatory Visit: Payer: Self-pay | Admitting: Family Medicine

## 2021-08-02 VITALS — BP 118/80 | Wt 217.0 lb

## 2021-08-02 DIAGNOSIS — E890 Postprocedural hypothyroidism: Secondary | ICD-10-CM

## 2021-08-02 DIAGNOSIS — Z8639 Personal history of other endocrine, nutritional and metabolic disease: Secondary | ICD-10-CM

## 2021-08-02 DIAGNOSIS — Z3A32 32 weeks gestation of pregnancy: Secondary | ICD-10-CM

## 2021-08-02 DIAGNOSIS — Z3400 Encounter for supervision of normal first pregnancy, unspecified trimester: Secondary | ICD-10-CM

## 2021-08-02 DIAGNOSIS — E039 Hypothyroidism, unspecified: Secondary | ICD-10-CM

## 2021-08-02 DIAGNOSIS — Z23 Encounter for immunization: Secondary | ICD-10-CM | POA: Diagnosis not present

## 2021-08-02 DIAGNOSIS — O99280 Endocrine, nutritional and metabolic diseases complicating pregnancy, unspecified trimester: Secondary | ICD-10-CM

## 2021-08-02 DIAGNOSIS — O3110X0 Continuing pregnancy after spontaneous abortion of one fetus or more, unspecified trimester, not applicable or unspecified: Secondary | ICD-10-CM

## 2021-08-02 DIAGNOSIS — O43123 Velamentous insertion of umbilical cord, third trimester: Secondary | ICD-10-CM

## 2021-08-02 LAB — POCT URINALYSIS DIPSTICK OB
Glucose, UA: NEGATIVE
POC,PROTEIN,UA: NEGATIVE

## 2021-08-02 MED ORDER — LEVOTHYROXINE SODIUM 112 MCG PO TABS: ORAL_TABLET | ORAL | 1 refills | Status: DC

## 2021-08-02 NOTE — Telephone Encounter (Signed)
RX has now been sent °

## 2021-08-02 NOTE — Progress Notes (Signed)
ROB - TDAP, no concerns. RM 3

## 2021-08-02 NOTE — Telephone Encounter (Signed)
MEDICATION: levothyroxine (SYNTHROID) 112 MCG tablet  PHARMACY:   [id number] [place], [place] - [address] Phone:  [phone]  Fax:  [phone]      HAS THE PATIENT CONTACTED THEIR PHARMACY?  yes  IS THIS A 90 DAY SUPPLY : yes  IS PATIENT OUT OF MEDICATION: no  IF NOT; HOW MUCH IS LEFT: 1 day supply left  LAST APPOINTMENT DATE: @12 /15/2022  NEXT APPOINTMENT DATE:@5 /19/2023  DO WE HAVE YOUR PERMISSION TO LEAVE A DETAILED MESSAGE?:yes  OTHER COMMENTS:    **Let patient know to contact pharmacy at the end of the day to make sure medication is ready. **  ** Please notify patient to allow 48-72 hours to process**  **Encourage patient to contact the pharmacy for refills or they can request refills through [facility]**

## 2021-08-02 NOTE — Progress Notes (Signed)
Routine Prenatal Care Visit  Subjective  Debra [name] is a 25 y.o. G1P0000 at 69w5dbeing seen today for ongoing prenatal care.  She is currently monitored for the following issues for this high-risk pregnancy and has H/O Graves' disease; Postoperative hypothyroidism; Chest pain; S/P total thyroidectomy; Chronic constipation; Psoriasis of scalp; GAD (generalized anxiety disorder); Hypothyroidism affecting pregnancy, antepartum; Supervision of normal first pregnancy, antepartum; Vanishing twin syndrome; Velamentous insertion of umbilical cord; and Vaginal discharge on their problem list.  ----------------------------------------------------------------------------------- Patient reports no complaints.   Contractions: Not present. Vag. Bleeding: None.  Movement: Present. Denies leaking of fluid.  ----------------------------------------------------------------------------------- The following portions of the patient's history were reviewed and updated as appropriate: allergies, current medications, past family history, past medical history, past social history, past surgical history and problem list. Problem list updated.   Objective  Blood pressure 118/80, weight 217 lb (98.4 kg), last menstrual period 12/16/2020. Pregravid weight 185 lb (83.9 kg) Total Weight Gain 32 lb (14.5 kg) Urinalysis:      Fetal Status: Fetal Heart Rate (bpm): 140 Fundal Height: 31 cm Movement: Present  Presentation: Vertex  General:  Alert, oriented and cooperative. Patient is in no acute distress.  Skin: Skin is warm and dry. No rash noted.   Cardiovascular: Normal heart rate noted  Respiratory: Normal respiratory effort, no problems with respiration noted  Abdomen: Soft, gravid, appropriate for gestational age. Pain/Pressure: Present     Pelvic:  Cervical exam deferred        Extremities: Normal range of motion.     ental Status: Normal [name] and affect. Normal behavior. Normal judgment and thought  content.   Immunization History  Administered Date(s) Administered   DTaP 07/01/1995, 09/01/1995, 10/29/1995, 08/02/1996, 12/26/2000   HPV 9-valent 12/18/2007, 02/24/2008, 08/15/2008   Hepatitis A 11/10/2013   Hepatitis B [id number] 07/01/1995, 10/29/1995   HiB (PRP-OMP) 07/01/1995, 09/01/1995, 10/29/1995, 07/23/1996   IPV 07/01/1995, 09/01/1995, 10/29/1995, 12/26/2000   Influenza Nasal 06/17/2005, 07/18/2008   Influenza,inj,Quad PF,6+ Mos 07/30/2018, 07/02/2021   Influenza-Unspecified 07/16/2006   MMR 08/02/1996, 12/26/2000   Meningococcal Conjugate 11/10/2013   Tdap 12/01/2006, 03/30/2020   Varicella 05/06/1996, 12/20/2008     Assessment   25 y.o. G1P0000 at 369w5dy  09/22/2021, by Last Menstrual Period presenting for routine prenatal visit  Plan   Pregnancy 1 Problems (from 01/31/21 to present)     Problem Noted Resolved   Velamentous insertion of umbilical cord 1102/54/2706y StMalachy MoodMD No   Vanishing twin syndrome 02/27/2021 by StMalachy MoodMD No   Hypothyroidism affecting pregnancy, antepartum 01/31/2021 by StMalachy MoodMD No   Overview Signed 02/27/2021  5:34 PM by StMalachy MoodMD    qTrimester thyroid panel      Supervision of normal first pregnancy, antepartum 01/31/2021 by StMalachy MoodMD No   Overview Addendum 07/16/2021  2:22 PM by StMalachy MoodMD     Nursing Staff Provider  Office Location  [facility] Dating  [id number]  Language  English Anatomy US [place]arginal cord insertion, otherwise normal  Flu Vaccine   Genetic Screen  NIPS: Normal XY  TDaP vaccine    Hgb A1C or  GTT Early : N/A Third trimester : 76  Covid    LAB RESULTS   Rhogam  N/A Blood Type A/Positive/-- (06/15 1117)   Feeding Plan  Antibody Negative (06/15 1117)  Contraception  Rubella 2.60 (06/15 1117)  Circumcision  RPR Non Reactive (06/15 1117)   Pediatrician   HBsAg Negative (06/15  1117)   Support Person  HIV Non Reactive (06/15 1117)  Prenatal  Classes  Varicella Immune    GBS  (For PCN allergy, check sensitivities)   BTL Consent N/A    VBAC Consent N/A Pap 03/27/2020 NILM    Hgb Electro      CF Negative     SMA Negative    Fragile-X Negative             Gestational age appropriate obstetric precautions including but not limited to vaginal bleeding, contractions, leaking of fluid and fetal movement were reviewed in detail with the patient.    Return in about 2 weeks (around 08/16/2021) for ROB.  Debra Mood, MD, [facility], [facility] 08/02/2021, 9:26 AM

## 2021-08-03 NOTE — Telephone Encounter (Signed)
Requested Prescriptions  Pending Prescriptions Disp Refills   citalopram (CELEXA) 20 MG tablet [Pharmacy Med Name: CITALOPRAM HBR 20 MG TABLET] 90 tablet 0    Sig: TAKE 1 TABLET BY MOUTH EVERY DAY     Psychiatry:  Antidepressants - SSRI Passed - 08/02/2021  9:55 AM      Passed - Valid encounter within last 6 months    Recent Outpatient Visits          5 months ago Encounter for annual health examination   [facility], Marzella Schlein, MD   1 year ago GAD (generalized anxiety disorder)   [facility], Marzella Schlein, MD   1 year ago GAD (generalized anxiety disorder)   [facility] Bacigalupo, Marzella Schlein, MD   1 year ago Chronic constipation   [facility] Bacigalupo, Marzella Schlein, MD      Future Appointments            In 7 months Bacigalupo, Marzella Schlein, MD [facility]

## 2021-08-07 ENCOUNTER — Ambulatory Visit: Payer: BC Managed Care – PPO | Attending: Maternal & Fetal Medicine

## 2021-08-07 ENCOUNTER — Other Ambulatory Visit: Payer: Self-pay

## 2021-08-07 VITALS — BP 134/79 | HR 93 | Temp 97.0°F | Ht 69.0 in | Wt 216.5 lb

## 2021-08-07 DIAGNOSIS — O43193 Other malformation of placenta, third trimester: Secondary | ICD-10-CM | POA: Diagnosis not present

## 2021-08-07 DIAGNOSIS — O99212 Obesity complicating pregnancy, second trimester: Secondary | ICD-10-CM | POA: Diagnosis not present

## 2021-08-07 DIAGNOSIS — O43123 Velamentous insertion of umbilical cord, third trimester: Secondary | ICD-10-CM | POA: Diagnosis not present

## 2021-08-07 DIAGNOSIS — Z8639 Personal history of other endocrine, nutritional and metabolic disease: Secondary | ICD-10-CM

## 2021-08-07 DIAGNOSIS — Z3400 Encounter for supervision of normal first pregnancy, unspecified trimester: Secondary | ICD-10-CM

## 2021-08-07 DIAGNOSIS — O3113X0 Continuing pregnancy after spontaneous abortion of one fetus or more, third trimester, not applicable or unspecified: Secondary | ICD-10-CM | POA: Insufficient documentation

## 2021-08-07 DIAGNOSIS — O99280 Endocrine, nutritional and metabolic diseases complicating pregnancy, unspecified trimester: Secondary | ICD-10-CM

## 2021-08-07 DIAGNOSIS — E0590 Thyrotoxicosis, unspecified without thyrotoxic crisis or storm: Secondary | ICD-10-CM | POA: Diagnosis not present

## 2021-08-07 DIAGNOSIS — O99283 Endocrine, nutritional and metabolic diseases complicating pregnancy, third trimester: Secondary | ICD-10-CM | POA: Diagnosis not present

## 2021-08-07 DIAGNOSIS — Z3A33 33 weeks gestation of pregnancy: Secondary | ICD-10-CM | POA: Insufficient documentation

## 2021-08-07 DIAGNOSIS — O3110X0 Continuing pregnancy after spontaneous abortion of one fetus or more, unspecified trimester, not applicable or unspecified: Secondary | ICD-10-CM

## 2021-08-07 DIAGNOSIS — E039 Hypothyroidism, unspecified: Secondary | ICD-10-CM

## 2021-08-19 NOTE — L&D Delivery Note (Signed)
Vaginal Delivery Note  Spontaneous delivery of live viable female infant from the LOA position through an intact perineum. Delivery of anterior right shoulder with gentle downward guidance followed by delivery of the left posterior shoulder with gentle upward guidance. Body followed spontaneously. Infant placed on maternal chest. Nursery present and helped with neonatal resuscitation and evaluation. Cord clamped and cut after one minute. Cord blood collected. Placenta delivered with manual removal after umbilical cord evulsion. Placenta noted to be intact with a 3 vessel cord .  1st degree and periurethral laceration. Uterus firm and below umbilicus at the end of the delivery.  Mom and baby recovering in stable condition. Sponge and needle counts were correct at the end of the delivery.  APGARS: 1 minute:7 5 minutes: 9 Weight: pending Epidural  1st degree and periurethral  Debra Prows MD [facility], [facility] 09/08/21 9:24 AM

## 2021-08-21 ENCOUNTER — Ambulatory Visit (INDEPENDENT_AMBULATORY_CARE_PROVIDER_SITE_OTHER): Payer: BC Managed Care – PPO | Admitting: Obstetrics and Gynecology

## 2021-08-21 ENCOUNTER — Other Ambulatory Visit: Payer: Self-pay

## 2021-08-21 VITALS — BP 120/72 | Wt 219.0 lb

## 2021-08-21 DIAGNOSIS — O43123 Velamentous insertion of umbilical cord, third trimester: Secondary | ICD-10-CM

## 2021-08-21 DIAGNOSIS — Z3A35 35 weeks gestation of pregnancy: Secondary | ICD-10-CM

## 2021-08-21 DIAGNOSIS — Z3685 Encounter for antenatal screening for Streptococcus B: Secondary | ICD-10-CM

## 2021-08-21 DIAGNOSIS — O99280 Endocrine, nutritional and metabolic diseases complicating pregnancy, unspecified trimester: Secondary | ICD-10-CM

## 2021-08-21 DIAGNOSIS — Z3400 Encounter for supervision of normal first pregnancy, unspecified trimester: Secondary | ICD-10-CM

## 2021-08-21 DIAGNOSIS — E039 Hypothyroidism, unspecified: Secondary | ICD-10-CM

## 2021-08-21 LAB — POCT URINALYSIS DIPSTICK OB
Glucose, UA: NEGATIVE
POC,PROTEIN,UA: NEGATIVE

## 2021-08-21 NOTE — Progress Notes (Signed)
ROB - bump on vagina x2 days. RM 5

## 2021-08-21 NOTE — Progress Notes (Signed)
Routine Prenatal Care Visit  Subjective  Debra [name] is a 26 y.o. G1P0000 at [redacted]w[redacted]d being seen today for ongoing prenatal care.  She is currently monitored for the following issues for this low-risk pregnancy and has H/O Graves' disease; Postoperative hypothyroidism; Chest pain; S/P total thyroidectomy; Chronic constipation; Psoriasis of scalp; GAD (generalized anxiety disorder); Hypothyroidism affecting pregnancy, antepartum; Supervision of normal first pregnancy, antepartum; Vanishing twin syndrome; Velamentous insertion of umbilical cord; and Vaginal discharge on their problem list.  ----------------------------------------------------------------------------------- Patient reports no complaints.   Contractions: Not present. Vag. Bleeding: None.  Movement: Present. Denies leaking of fluid.  ----------------------------------------------------------------------------------- The following portions of the patient's history were reviewed and updated as appropriate: allergies, current medications, past family history, past medical history, past social history, past surgical history and problem list. Problem list updated.   Objective  Blood pressure 120/72, weight 219 lb (99.3 kg), last menstrual period 12/16/2020. Pregravid weight 185 lb (83.9 kg) Total Weight Gain 34 lb (15.4 kg) Urinalysis:      Fetal Status: Fetal Heart Rate (bpm): 125 Fundal Height: 36 cm Movement: Present  Presentation: Vertex  General:  Alert, oriented and cooperative. Patient is in no acute distress.  Skin: Skin is warm and dry. No rash noted.   Cardiovascular: Normal heart rate noted  Respiratory: Normal respiratory effort, no problems with respiration noted  Abdomen: Soft, gravid, appropriate for gestational age. Pain/Pressure: Present     Pelvic:  Cervical exam deferred        Extremities: Normal range of motion.     ental Status: Normal mood and affect. Normal behavior. Normal judgment and thought  content.     Assessment   26 y.o. G1P0000 at [redacted]w[redacted]d by  09/22/2021, by Last Menstrual Period presenting for routine prenatal visit  Plan   Pregnancy 1 Problems (from 01/31/21 to present)     Problem Noted Resolved   Velamentous insertion of umbilical cord [id number] by Malachy Mood, MD No   Vanishing twin syndrome 02/27/2021 by Malachy Mood, MD No   Hypothyroidism affecting pregnancy, antepartum 01/31/2021 by Malachy Mood, MD No   Overview Signed 02/27/2021  5:34 PM by Malachy Mood, MD    qTrimester thyroid panel      Supervision of normal first pregnancy, antepartum 01/31/2021 by Malachy Mood, MD No   Overview Addendum 08/02/2021  9:25 AM by Malachy Mood, MD     Nursing Staff Provider  Office Location  [facility] Dating  [redacted]w[redacted]d Ultrasound  Language  English Anatomy US  Marginal cord insertion, otherwise normal  Flu Vaccine  07/02/21 Genetic Screen  NIPS: Normal XY  TDaP vaccine   08/02/21 Hgb A1C or  GTT Early : N/A Third trimester : 76  Covid 2 doses   LAB RESULTS   Rhogam  N/A Blood Type A/Positive/-- (06/15 1117)   Feeding Plan  Antibody Negative (06/15 1117)  Contraception  Rubella 2.60 (06/15 1117)  Circumcision  RPR Non Reactive (06/15 1117)   Pediatrician   HBsAg Negative (06/15 1117)   Support Person Husband Matt HIV Non Reactive (06/15 1117)  Prenatal Classes  Varicella Immune    GBS  (For PCN allergy, check sensitivities)   BTL Consent N/A    VBAC Consent N/A Pap 03/27/2020 NILM    Hgb Electro      CF Negative     SMA Negative    Fragile-X Negative             Gestational age appropriate obstetric precautions including but not limited  to vaginal bleeding, contractions, leaking of fluid and fetal movement were reviewed in detail with the patient.    - GBS collected  Return in about 1 week (around 08/28/2021) for ROB 1 week, then weekly for 3 weeks.  Malachy Mood, MD, [facility], [facility] 08/21/2021,  11:46 AM

## 2021-08-23 ENCOUNTER — Encounter: Payer: Self-pay | Admitting: Obstetrics and Gynecology

## 2021-08-23 LAB — STREP GP B NAA: Strep Gp B NAA: NEGATIVE

## 2021-08-27 ENCOUNTER — Other Ambulatory Visit: Payer: Self-pay

## 2021-08-27 ENCOUNTER — Ambulatory Visit (INDEPENDENT_AMBULATORY_CARE_PROVIDER_SITE_OTHER): Payer: BC Managed Care – PPO | Admitting: Obstetrics and Gynecology

## 2021-08-27 VITALS — BP 132/80 | Wt 223.0 lb

## 2021-08-27 DIAGNOSIS — Z3400 Encounter for supervision of normal first pregnancy, unspecified trimester: Secondary | ICD-10-CM

## 2021-08-27 DIAGNOSIS — E039 Hypothyroidism, unspecified: Secondary | ICD-10-CM

## 2021-08-27 DIAGNOSIS — Z3A36 36 weeks gestation of pregnancy: Secondary | ICD-10-CM

## 2021-08-27 DIAGNOSIS — O99280 Endocrine, nutritional and metabolic diseases complicating pregnancy, unspecified trimester: Secondary | ICD-10-CM

## 2021-08-27 DIAGNOSIS — O43123 Velamentous insertion of umbilical cord, third trimester: Secondary | ICD-10-CM

## 2021-08-27 LAB — POCT URINALYSIS DIPSTICK OB
Glucose, UA: NEGATIVE
POC,PROTEIN,UA: NEGATIVE

## 2021-08-27 NOTE — Progress Notes (Signed)
Routine Prenatal Care Visit  Subjective  ROSANGELICA [name] is a 26 y.o. G1P0000 at [redacted]w[redacted]d being seen today for ongoing prenatal care.  She is currently monitored for the following issues for this low-risk pregnancy and has H/O Graves' disease; Postoperative hypothyroidism; Chest pain; S/P total thyroidectomy; Chronic constipation; Psoriasis of scalp; GAD (generalized anxiety disorder); Hypothyroidism affecting pregnancy, antepartum; Supervision of normal first pregnancy, antepartum; Vanishing twin syndrome; Velamentous insertion of umbilical cord; and Vaginal discharge on their problem list.  ----------------------------------------------------------------------------------- Patient reports no complaints.   Contractions: Not present. Vag. Bleeding: None.  Movement: Present. Denies leaking of fluid.  ----------------------------------------------------------------------------------- The following portions of the patient's history were reviewed and updated as appropriate: allergies, current medications, past family history, past medical history, past social history, past surgical history and problem list. Problem list updated.   Objective  Blood pressure 132/80, weight 223 lb (101.2 kg), last menstrual period 12/16/2020. Pregravid weight 185 lb (83.9 kg) Total Weight Gain 38 lb (17.2 kg) Urinalysis:      Fetal Status: Fetal Heart Rate (bpm): 135 Fundal Height: 36 cm Movement: Present  Presentation: Vertex  General:  Alert, oriented and cooperative. Patient is in no acute distress.  Skin: Skin is warm and dry. No rash noted.   Cardiovascular: Normal heart rate noted  Respiratory: Normal respiratory effort, no problems with respiration noted  Abdomen: Soft, gravid, appropriate for gestational age. Pain/Pressure: Present     Pelvic:  Cervical exam deferred Dilation: Fingertip Effacement (%): 50 Station: -3  Extremities: Normal range of motion.     ental Status: Normal mood and affect.  Normal behavior. Normal judgment and thought content.     Assessment   27 y.o. G1P0000 at [redacted]w[redacted]d by  09/22/2021, by Last Menstrual Period presenting for routine prenatal visit  Plan   Pregnancy 1 Problems (from 01/31/21 to present)     Problem Noted Resolved   Velamentous insertion of umbilical cord 07/02/2021 by Vena Austria, MD No   Vanishing twin syndrome 02/27/2021 by Vena Austria, MD No   Hypothyroidism affecting pregnancy, antepartum 01/31/2021 by Vena Austria, MD No   Overview Signed 02/27/2021  5:34 PM by Vena Austria, MD    qTrimester thyroid panel      Supervision of normal first pregnancy, antepartum 01/31/2021 by Vena Austria, MD No   Overview Addendum 08/23/2021  8:07 AM by Vena Austria, MD     Nursing Staff Provider  Office Location  [facility] Dating  [redacted]w[redacted]d Ultrasound  Language  English Anatomy US  Marginal cord insertion, otherwise normal  Flu Vaccine  07/02/21 Genetic Screen  NIPS: Normal XY  TDaP vaccine   08/02/21 Hgb A1C or  GTT Early : N/A Third trimester : 76  Covid 2 doses   LAB RESULTS   Rhogam  N/A Blood Type A/Positive/-- (06/15 1117)   Feeding Plan  Antibody Negative (06/15 1117)  Contraception  Rubella 2.60 (06/15 1117)  Circumcision  RPR Non Reactive (06/15 1117)   Pediatrician   HBsAg Negative (06/15 1117)   Support Person Husband Matt HIV Non Reactive (06/15 1117)  Prenatal Classes  Varicella Immune    GBS negative  BTL Consent N/A    VBAC Consent N/A Pap 03/27/2020 NILM    Hgb Electro      CF Negative     SMA Negative    Fragile-X Negative             Gestational age appropriate obstetric precautions including but not limited to vaginal bleeding, contractions, leaking  of fluid and fetal movement were reviewed in detail with the patient.    Return in about 1 week (around 09/03/2021) for ROB.  Vena Austria, MD, [facility], [facility] 08/27/2021, 10:57 AM

## 2021-08-27 NOTE — Progress Notes (Signed)
ROB - no concerns. RM 5 

## 2021-09-04 ENCOUNTER — Other Ambulatory Visit: Payer: Self-pay

## 2021-09-04 ENCOUNTER — Ambulatory Visit (INDEPENDENT_AMBULATORY_CARE_PROVIDER_SITE_OTHER): Payer: BC Managed Care – PPO | Admitting: Obstetrics and Gynecology

## 2021-09-04 VITALS — BP 120/80 | Wt 223.0 lb

## 2021-09-04 DIAGNOSIS — E039 Hypothyroidism, unspecified: Secondary | ICD-10-CM

## 2021-09-04 DIAGNOSIS — O3110X0 Continuing pregnancy after spontaneous abortion of one fetus or more, unspecified trimester, not applicable or unspecified: Secondary | ICD-10-CM

## 2021-09-04 DIAGNOSIS — O99280 Endocrine, nutritional and metabolic diseases complicating pregnancy, unspecified trimester: Secondary | ICD-10-CM

## 2021-09-04 DIAGNOSIS — O43123 Velamentous insertion of umbilical cord, third trimester: Secondary | ICD-10-CM

## 2021-09-04 DIAGNOSIS — Z3A37 37 weeks gestation of pregnancy: Secondary | ICD-10-CM

## 2021-09-04 DIAGNOSIS — O0990 Supervision of high risk pregnancy, unspecified, unspecified trimester: Secondary | ICD-10-CM

## 2021-09-04 DIAGNOSIS — Z3400 Encounter for supervision of normal first pregnancy, unspecified trimester: Secondary | ICD-10-CM

## 2021-09-04 DIAGNOSIS — Z8639 Personal history of other endocrine, nutritional and metabolic disease: Secondary | ICD-10-CM

## 2021-09-04 NOTE — Progress Notes (Signed)
Routine Prenatal Care Visit  Subjective  Debra [name] is a 26 y.o. G1P0000 at [redacted]w[redacted]d being seen today for ongoing prenatal care.  She is currently monitored for the following issues for this low-risk pregnancy and has H/O Graves' disease; Postoperative hypothyroidism; Chest pain; S/P total thyroidectomy; Chronic constipation; Psoriasis of scalp; GAD (generalized anxiety disorder); Hypothyroidism affecting pregnancy, antepartum; Supervision of normal first pregnancy, antepartum; Vanishing twin syndrome; Velamentous insertion of umbilical cord; and Vaginal discharge on their problem list.  ----------------------------------------------------------------------------------- Patient reports no complaints.   Contractions: Not present. Vag. Bleeding: None.  Movement: Present. Denies leaking of fluid.  ----------------------------------------------------------------------------------- The following portions of the patient's history were reviewed and updated as appropriate: allergies, current medications, past family history, past medical history, past social history, past surgical history and problem list. Problem list updated.   Objective  Blood pressure 120/80, weight 223 lb (101.2 kg), last menstrual period 12/16/2020. Pregravid weight 185 lb (83.9 kg) Total Weight Gain 38 lb (17.2 kg) Urinalysis:      Fetal Status: Fetal Heart Rate (bpm): 150 Fundal Height: 36 cm Movement: Present  Presentation: Vertex  General:  Alert, oriented and cooperative. Patient is in no acute distress.  Skin: Skin is warm and dry. No rash noted.   Cardiovascular: Normal heart rate noted  Respiratory: Normal respiratory effort, no problems with respiration noted  Abdomen: Soft, gravid, appropriate for gestational age. Pain/Pressure: Present     Pelvic:  Cervical exam performed Dilation: 1 Effacement (%): 70 Station: -3  Extremities: Normal range of motion.     ental Status: Normal mood and affect. Normal  behavior. Normal judgment and thought content.     Assessment   26 y.o. G1P0000 at [redacted]w[redacted]d by  09/22/2021, by Last Menstrual Period presenting for routine prenatal visit  Plan   Pregnancy 1 Problems (from 01/31/21 to present)     Problem Noted Resolved   Velamentous insertion of umbilical cord 07/02/2021 by Vena Austria, MD No   Vanishing twin syndrome 02/27/2021 by Vena Austria, MD No   Hypothyroidism affecting pregnancy, antepartum 01/31/2021 by Vena Austria, MD No   Overview Signed 02/27/2021  5:34 PM by Vena Austria, MD    qTrimester thyroid panel      Supervision of normal first pregnancy, antepartum 01/31/2021 by Vena Austria, MD No   Overview Addendum 08/23/2021  8:07 AM by Vena Austria, MD     Nursing Staff Provider  Office Location  [facility] Dating  [redacted]w[redacted]d Ultrasound  Language  English Anatomy US  Marginal cord insertion, otherwise normal  Flu Vaccine  07/02/21 Genetic Screen  NIPS: Normal XY  TDaP vaccine   08/02/21 Hgb A1C or  GTT Early : N/A Third trimester : 76  Covid 2 doses   LAB RESULTS   Rhogam  N/A Blood Type A/Positive/-- (06/15 1117)   Feeding Plan  Antibody Negative (06/15 1117)  Contraception  Rubella 2.60 (06/15 1117)  Circumcision  RPR Non Reactive (06/15 1117)   Pediatrician   HBsAg Negative (06/15 1117)   Support Person Husband Matt HIV Non Reactive (06/15 1117)  Prenatal Classes  Varicella Immune    GBS negative  BTL Consent N/A    VBAC Consent N/A Pap 03/27/2020 NILM    Hgb Electro      CF Negative     SMA Negative    Fragile-X Negative             Gestational age appropriate obstetric precautions including but not limited to vaginal bleeding, contractions, leaking  of fluid and fetal movement were reviewed in detail with the patient.    Return in about 1 week (around 09/11/2021) for ROB.  Vena Austria, MD, [facility], [facility] 09/04/2021, 10:44 AM

## 2021-09-06 ENCOUNTER — Other Ambulatory Visit: Payer: Self-pay

## 2021-09-06 ENCOUNTER — Ambulatory Visit: Payer: BC Managed Care – PPO | Attending: Obstetrics

## 2021-09-06 VITALS — BP 126/79 | HR 87 | Temp 97.8°F | Ht 69.0 in | Wt 224.0 lb

## 2021-09-06 DIAGNOSIS — O3110X0 Continuing pregnancy after spontaneous abortion of one fetus or more, unspecified trimester, not applicable or unspecified: Secondary | ICD-10-CM | POA: Diagnosis not present

## 2021-09-06 DIAGNOSIS — O99280 Endocrine, nutritional and metabolic diseases complicating pregnancy, unspecified trimester: Secondary | ICD-10-CM

## 2021-09-06 DIAGNOSIS — O0993 Supervision of high risk pregnancy, unspecified, third trimester: Secondary | ICD-10-CM | POA: Diagnosis present

## 2021-09-06 DIAGNOSIS — E039 Hypothyroidism, unspecified: Secondary | ICD-10-CM | POA: Diagnosis not present

## 2021-09-06 DIAGNOSIS — O99283 Endocrine, nutritional and metabolic diseases complicating pregnancy, third trimester: Secondary | ICD-10-CM

## 2021-09-06 DIAGNOSIS — E669 Obesity, unspecified: Secondary | ICD-10-CM | POA: Diagnosis not present

## 2021-09-06 DIAGNOSIS — Z09 Encounter for follow-up examination after completed treatment for conditions other than malignant neoplasm: Secondary | ICD-10-CM | POA: Insufficient documentation

## 2021-09-06 DIAGNOSIS — O0990 Supervision of high risk pregnancy, unspecified, unspecified trimester: Secondary | ICD-10-CM

## 2021-09-06 DIAGNOSIS — Z3A37 37 weeks gestation of pregnancy: Secondary | ICD-10-CM

## 2021-09-06 DIAGNOSIS — Z8639 Personal history of other endocrine, nutritional and metabolic disease: Secondary | ICD-10-CM | POA: Diagnosis not present

## 2021-09-06 DIAGNOSIS — O99213 Obesity complicating pregnancy, third trimester: Secondary | ICD-10-CM | POA: Insufficient documentation

## 2021-09-06 DIAGNOSIS — Z3400 Encounter for supervision of normal first pregnancy, unspecified trimester: Secondary | ICD-10-CM

## 2021-09-06 NOTE — Progress Notes (Unsigned)
Pt reports contractions on arrival.  11 minutes apart.  Pt also reported bleeding once around 1200 today when wiping. Stated she already lost her mucus plug.  Dr. Parke Poisson notified prior to pt's MFM ultrasound started today. VSS.

## 2021-09-07 ENCOUNTER — Inpatient Hospital Stay: Payer: BC Managed Care – PPO | Admitting: Anesthesiology

## 2021-09-07 ENCOUNTER — Inpatient Hospital Stay
Admission: EM | Admit: 2021-09-07 | Discharge: 2021-09-10 | DRG: 805 | Disposition: A | Discharge status: Home / Self Care | Payer: BC Managed Care – PPO | Attending: Obstetrics and Gynecology | Admitting: Obstetrics and Gynecology

## 2021-09-07 ENCOUNTER — Encounter: Payer: Self-pay | Admitting: Obstetrics and Gynecology

## 2021-09-07 ENCOUNTER — Other Ambulatory Visit: Payer: Self-pay

## 2021-09-07 DIAGNOSIS — D62 Acute posthemorrhagic anemia: Secondary | ICD-10-CM | POA: Diagnosis not present

## 2021-09-07 DIAGNOSIS — E890 Postprocedural hypothyroidism: Secondary | ICD-10-CM | POA: Diagnosis present

## 2021-09-07 DIAGNOSIS — O26893 Other specified pregnancy related conditions, third trimester: Secondary | ICD-10-CM | POA: Diagnosis present

## 2021-09-07 DIAGNOSIS — U071 COVID-19: Secondary | ICD-10-CM | POA: Diagnosis present

## 2021-09-07 DIAGNOSIS — O9852 Other viral diseases complicating childbirth: Secondary | ICD-10-CM | POA: Diagnosis present

## 2021-09-07 DIAGNOSIS — O9081 Anemia of the puerperium: Secondary | ICD-10-CM | POA: Diagnosis not present

## 2021-09-07 DIAGNOSIS — Z3A37 37 weeks gestation of pregnancy: Secondary | ICD-10-CM | POA: Diagnosis not present

## 2021-09-07 DIAGNOSIS — O99280 Endocrine, nutritional and metabolic diseases complicating pregnancy, unspecified trimester: Secondary | ICD-10-CM

## 2021-09-07 DIAGNOSIS — O4290 Premature rupture of membranes, unspecified as to length of time between rupture and onset of labor, unspecified weeks of gestation: Secondary | ICD-10-CM | POA: Diagnosis present

## 2021-09-07 DIAGNOSIS — E039 Hypothyroidism, unspecified: Secondary | ICD-10-CM

## 2021-09-07 DIAGNOSIS — O3110X0 Continuing pregnancy after spontaneous abortion of one fetus or more, unspecified trimester, not applicable or unspecified: Secondary | ICD-10-CM

## 2021-09-07 DIAGNOSIS — O9903 Anemia complicating the puerperium: Secondary | ICD-10-CM | POA: Diagnosis not present

## 2021-09-07 DIAGNOSIS — O43123 Velamentous insertion of umbilical cord, third trimester: Principal | ICD-10-CM

## 2021-09-07 DIAGNOSIS — Z3A38 38 weeks gestation of pregnancy: Secondary | ICD-10-CM | POA: Diagnosis not present

## 2021-09-07 DIAGNOSIS — Z3400 Encounter for supervision of normal first pregnancy, unspecified trimester: Secondary | ICD-10-CM

## 2021-09-07 DIAGNOSIS — O99284 Endocrine, nutritional and metabolic diseases complicating childbirth: Secondary | ICD-10-CM | POA: Diagnosis present

## 2021-09-07 DIAGNOSIS — O4202 Full-term premature rupture of membranes, onset of labor within 24 hours of rupture: Secondary | ICD-10-CM | POA: Diagnosis not present

## 2021-09-07 DIAGNOSIS — O98513 Other viral diseases complicating pregnancy, third trimester: Secondary | ICD-10-CM | POA: Diagnosis not present

## 2021-09-07 LAB — RESP PANEL BY RT-PCR (FLU A&B, COVID) ARPGX2
Influenza A by PCR: NEGATIVE
Influenza B by PCR: NEGATIVE
SARS Coronavirus 2 by RT PCR: POSITIVE — AB

## 2021-09-07 LAB — CBC
HCT: 34.8 % — ABNORMAL LOW (ref 36.0–46.0)
Hemoglobin: 12 g/dL (ref 12.0–15.0)
MCH: 31.2 pg (ref 26.0–34.0)
MCHC: 34.5 g/dL (ref 30.0–36.0)
MCV: 90.4 fL (ref 80.0–100.0)
Platelets: 308 10*3/uL (ref 150–400)
RBC: 3.85 MIL/uL — ABNORMAL LOW (ref 3.87–5.11)
RDW: 12.8 % (ref 11.5–15.5)
WBC: 13.6 10*3/uL — ABNORMAL HIGH (ref 4.0–10.5)
nRBC: 0 % (ref 0.0–0.2)

## 2021-09-07 LAB — RUPTURE OF MEMBRANE (ROM)PLUS: Rom Plus: POSITIVE

## 2021-09-07 LAB — TYPE AND SCREEN
ABO/RH(D): A POS
Antibody Screen: NEGATIVE

## 2021-09-07 MED ORDER — OXYTOCIN-SODIUM CHLORIDE 30-0.9 UT/500ML-% IV SOLN
INTRAVENOUS | Status: AC
  Administered 2021-09-07: 2 m[IU]/min via INTRAVENOUS
  Filled 2021-09-07: qty 500

## 2021-09-07 MED ORDER — SODIUM CHLORIDE 0.9 % IV SOLN
INTRAVENOUS | Status: DC | PRN
  Administered 2021-09-07 (×2): 5 mL via EPIDURAL

## 2021-09-07 MED ORDER — LIDOCAINE HCL (PF) 1 % IJ SOLN
INTRAMUSCULAR | Status: AC
  Filled 2021-09-07: qty 30

## 2021-09-07 MED ORDER — FENTANYL-BUPIVACAINE-NACL 0.5-0.125-0.9 MG/250ML-% EP SOLN: 12.0000 mL/h | EPIDURAL | Status: DC | PRN

## 2021-09-07 MED ORDER — OXYTOCIN-SODIUM CHLORIDE 30-0.9 UT/500ML-% IV SOLN
2.5000 [IU]/h | INTRAVENOUS | Status: DC
  Administered 2021-09-08: 2.5 [IU]/h via INTRAVENOUS

## 2021-09-07 MED ORDER — LIDOCAINE-EPINEPHRINE (PF) 1.5 %-1:200000 IJ SOLN
INTRAMUSCULAR | Status: DC | PRN
  Administered 2021-09-07: 3 mL via EPIDURAL

## 2021-09-07 MED ORDER — EPHEDRINE 5 MG/ML INJ: 10.0000 mg | INTRAVENOUS | Status: DC | PRN

## 2021-09-07 MED ORDER — BUTORPHANOL TARTRATE 1 MG/ML IJ SOLN: 1.0000 mg | INTRAMUSCULAR | Status: DC | PRN

## 2021-09-07 MED ORDER — AMMONIA AROMATIC IN INHA
RESPIRATORY_TRACT | Status: AC
  Filled 2021-09-07: qty 10

## 2021-09-07 MED ORDER — FENTANYL-BUPIVACAINE-NACL 0.5-0.125-0.9 MG/250ML-% EP SOLN
EPIDURAL | Status: DC | PRN
  Administered 2021-09-07: 12 mL/h via EPIDURAL

## 2021-09-07 MED ORDER — DIPHENHYDRAMINE HCL 50 MG/ML IJ SOLN: 12.5000 mg | INTRAMUSCULAR | Status: DC | PRN

## 2021-09-07 MED ORDER — PHENYLEPHRINE 40 MCG/ML (10ML) SYRINGE FOR IV PUSH (FOR BLOOD PRESSURE SUPPORT): 80.0000 ug | PREFILLED_SYRINGE | INTRAVENOUS | Status: DC | PRN

## 2021-09-07 MED ORDER — TERBUTALINE SULFATE 1 MG/ML IJ SOLN: 0.2500 mg | Freq: Once | INTRAMUSCULAR | Status: DC | PRN

## 2021-09-07 MED ORDER — LIDOCAINE HCL (PF) 1 % IJ SOLN
INTRAMUSCULAR | Status: DC | PRN
  Administered 2021-09-07: 3 mL via SUBCUTANEOUS

## 2021-09-07 MED ORDER — FENTANYL-BUPIVACAINE-NACL 0.5-0.125-0.9 MG/250ML-% EP SOLN
EPIDURAL | Status: AC
  Filled 2021-09-07: qty 250

## 2021-09-07 MED ORDER — OXYTOCIN 10 UNIT/ML IJ SOLN
INTRAMUSCULAR | Status: AC
  Filled 2021-09-07: qty 2

## 2021-09-07 MED ORDER — LACTATED RINGERS IV SOLN: 500.0000 mL | INTRAVENOUS | Status: DC | PRN

## 2021-09-07 MED ORDER — LACTATED RINGERS IV SOLN: INTRAVENOUS | Status: DC

## 2021-09-07 MED ORDER — OXYTOCIN-SODIUM CHLORIDE 30-0.9 UT/500ML-% IV SOLN
1.0000 m[IU]/min | INTRAVENOUS | Status: DC
  Filled 2021-09-07: qty 500

## 2021-09-07 MED ORDER — SOD CITRATE-CITRIC ACID 500-334 MG/5ML PO SOLN: 30.0000 mL | ORAL | Status: DC | PRN

## 2021-09-07 MED ORDER — ONDANSETRON HCL 4 MG/2ML IJ SOLN
4.0000 mg | Freq: Four times a day (QID) | INTRAMUSCULAR | Status: DC | PRN
  Administered 2021-09-07 – 2021-09-08 (×2): 4 mg via INTRAVENOUS
  Filled 2021-09-07 (×2): qty 2

## 2021-09-07 MED ORDER — LIDOCAINE HCL (PF) 1 % IJ SOLN: 30.0000 mL | INTRAMUSCULAR | Status: DC | PRN

## 2021-09-07 MED ORDER — MISOPROSTOL 200 MCG PO TABS
ORAL_TABLET | ORAL | Status: AC
  Filled 2021-09-07: qty 4

## 2021-09-07 MED ORDER — OXYTOCIN BOLUS FROM INFUSION
333.0000 mL | Freq: Once | INTRAVENOUS | Status: AC
  Administered 2021-09-08: 333 mL via INTRAVENOUS

## 2021-09-07 MED ORDER — ACETAMINOPHEN 325 MG PO TABS: 650.0000 mg | ORAL_TABLET | ORAL | Status: DC | PRN

## 2021-09-07 MED ORDER — LACTATED RINGERS IV SOLN
500.0000 mL | Freq: Once | INTRAVENOUS | Status: AC
  Administered 2021-09-07: 500 mL via INTRAVENOUS

## 2021-09-07 NOTE — H&P (Signed)
Obstetric H&P   Chief Complaint: Leaking of fluid  Prenatal Care Provider: [facility]  History of Present Illness: 26 y.o. G1P0000 23w6dby 09/22/2021, by Last Menstrual Period presenting to L&D with LOF early this morning around 10 AM. +FM, no LOF, no VB, irregular contractions.  ROM plus positive.    Pregnancy notable for postsurgical hypothyroidism, vanishing twin 1st trimester, as well as marginal cord insertion.   Most recent growth scan 09/06/2021 EFW of 7lbs 3oz c/w 57%ile  Pregravid weight 83.9 kg Total Weight Gain 17.7 kg  Pregnancy 1 Problems (from 01/31/21 to present)     Problem Noted Resolved   Velamentous insertion of umbilical cord 116/10/9604by SMalachy Mood MD No   Vanishing twin syndrome 02/27/2021 by SMalachy Mood MD No   Hypothyroidism affecting pregnancy, antepartum 01/31/2021 by SMalachy Mood MD No   Overview Signed 02/27/2021  5:34 PM by SMalachy Mood MD    qTrimester thyroid panel      Supervision of normal first pregnancy, antepartum 01/31/2021 by SMalachy Mood MD No   Overview Addendum 08/23/2021  8:07 AM by SMalachy Mood MD     Nursing Staff Provider  Office Location  [facility] Dating  [id number]  Language  English Anatomy US[place]Marginal cord insertion, otherwise normal  Flu Vaccine  07/02/21 Genetic Screen  NIPS: Normal XY  TDaP vaccine   08/02/21 Hgb A1C or  GTT Early : N/A Third trimester : 76  Covid 2 doses   LAB RESULTS   Rhogam  N/A Blood Type A/Positive/-- (06/15 1117)   Feeding Plan  Antibody Negative (06/15 1117)  Contraception  Rubella 2.60 (06/15 1117)  Circumcision  RPR Non Reactive (06/15 1117)   Pediatrician   HBsAg Negative (06/15 1117)   Support Person Husband Matt HIV Non Reactive (06/15 1117)  Prenatal Classes  Varicella Immune    GBS negative  BTL Consent N/A    VBAC Consent N/A Pap 03/27/2020 NILM    Hgb Electro      CF Negative     SMA Negative    Fragile-X Negative             Review of Systems:  10 point review of systems negative unless otherwise noted in HPI  Past Medical History: Patient Active Problem List   Diagnosis Date Noted   Labor and delivery indication for care or intervention 09/07/2021   Vaginal discharge 07/19/2021   Velamentous insertion of umbilical cord 1154/04/8118 Vanishing twin syndrome 02/27/2021   Hypothyroidism affecting pregnancy, antepartum 01/31/2021    qTrimester thyroid panel    Supervision of normal first pregnancy, antepartum 01/31/2021     Nursing Staff Provider  Office Location  [facility] Dating  [id number]  Language  English Anatomy US [place]inal cord insertion, otherwise normal  Flu Vaccine  07/02/21 Genetic Screen  NIPS: Normal XY  TDaP vaccine   08/02/21 Hgb A1C or  GTT Early : N/A Third trimester : 76  Covid 2 doses   LAB RESULTS   Rhogam  N/A Blood Type A/Positive/-- (06/15 1117)   Feeding Plan  Antibody Negative (06/15 1117)  Contraception  Rubella 2.60 (06/15 1117)  Circumcision  RPR Non Reactive (06/15 1117)   Pediatrician   HBsAg Negative (06/15 1117)   Support Person Husband Matt HIV Non Reactive (06/15 1117)  Prenatal Classes  Varicella Immune    GBS negative  BTL Consent N/A    VBAC Consent N/A Pap 03/27/2020 NILM    Hgb Electro  CF Negative     SMA Negative    Fragile-X Negative      Chronic constipation 03/31/2020   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   GAD (generalized anxiety disorder) 03/31/2020   Chest pain 03/17/2017   Postoperative hypothyroidism 08/09/2016   S/P total thyroidectomy 08/09/2016   H/O Graves' disease 01/10/2015    Past Surgical History: Past Surgical History:  Procedure Laterality Date   TONSILLECTOMY AND ADENOIDECTOMY  2012   TOTAL THYROIDECTOMY      Past Obstetric History: # 1 - Date: None, Sex: None, Weight: None, GA: None, Delivery: None, Apgar1: None, Apgar5: None, Living: None, Birth Comments: None   Past Gynecologic History:  Family History: Family History  Problem  Relation Age of Onset   Hypertension Father    Gout Father    Hypertension Maternal Grandfather    Stroke Maternal Grandfather    Arthritis Paternal Grandmother    Gout Paternal Grandmother    Hypertension Paternal Grandmother    Melanoma Paternal Grandmother    Intellectual disability Paternal Grandfather    Heart Problems Paternal Grandfather        CABG x4   Thyroid disease Other     Social History: Social History   Socioeconomic History   Marital status: Married    Spouse name: Rodman Key   Number of children: 0   Years of education: Not on file   Highest education level: Not on file  Occupational History   Occupation: Chartered certified accountant of [employer]  Tobacco Use   Smoking status: Never   Smokeless tobacco: Never  Vaping Use   Vaping Use: Never used  Substance and Sexual Activity   Alcohol use: Not Currently    Comment: rarely   Drug use: No   Sexual activity: Yes    Partners: Male    Birth control/protection: Pill  Other Topics Concern   Not on file  Social History Narrative   Not on file   Social Determinants of Health   Financial Resource Strain: Not on file  Food Insecurity: Not on file  Transportation Needs: Not on file  Physical Activity: Not on file  Stress: Not on file  Social Connections: Not on file  Intimate Partner Violence: Not on file    Medications: Prior to Admission medications   Medication Sig Start Date End Date Taking? Authorizing Provider  citalopram (CELEXA) 20 MG tablet TAKE 1 TABLET BY MOUTH EVERY DAY 08/03/21  Yes Bacigalupo, Dionne Bucy, MD  levothyroxine (SYNTHROID) 112 MCG tablet TAKE 1 TABLET BY MOUTH DAILY BEFORE BREAKFAST. 08/02/21  Yes Philemon Kingdom, MD  ondansetron (ZOFRAN ODT) 4 MG disintegrating tablet Take 1 tablet (4 mg total) by mouth every 6 (six) hours as needed for nausea. 06/04/21  Yes Malachy Mood, MD  Prenatal Vit-Fe Fumarate-FA (PRENATAL MULTIVITAMIN) TABS tablet Take 1 tablet by mouth daily at 12 noon.    Yes [provider]  clobetasol (OLUX) 0.05 % topical foam Apply topically 2 (two) times daily. 05/30/20   Virginia Crews, MD    Allergies: Allergies  Allergen Reactions   Sulfa Antibiotics Rash    Physical Exam: Vitals: Blood pressure 120/71, pulse 90, temperature 97.8 F (36.6 C), temperature source Oral, resp. rate 18, last menstrual period 12/16/2020.  Urine Dip Protein: N/A  FHT: 130, moderate, +accels, no decels Toco: q23mn  General: NAD HEENT: normocephalic, anicteric Pulmonary: No increased work of breathing Cardiovascular: RRR, distal pulses 2+ Abdomen: Gravid, non-tender Leopolds:vtx Genitourinary: Dilation: 4 Effacement (%): 70 Cervical Position: Middle Station: -  2 Presentation: Vertex Exam by:: Jones Bales  Extremities: no edema, erythema, or tenderness Neurologic: Grossly intact Psychiatric: mood appropriate, affect full  Labs: Results for orders placed or performed during the hospital encounter of 09/07/21 (from the past 24 hour(s))  ROM Plus ([facility] only)     Status: None   Collection Time: 09/07/21  2:52 PM  Result Value Ref Range   Rom Plus POSITIVE     Assessment: 26 y.o. G1P0000 51w6dby 09/22/2021, by Last Menstrual Period SROM at term  Plan: 1) SROM - monitor for cervical change, if no change or contractions space out pitocin written  2) Fetus - cat I tracing  3) PNL - Blood type A/Positive/-- (06/15 1117) / Anti-bodyscreen Negative (06/15 1117) / Rubella 2.60 (06/15 1117) / Varicella Immune / RPR Non Reactive (11/14 1055) / HBsAg Negative (06/15 1117) / HIV Non Reactive (11/14 1055) / 1-hr OGTT 76 / GBS Negative/-- (01/03 1216)  4) Immunization History -  Immunization History  Administered Date(s) Administered   DTaP 07/01/1995, 09/01/1995, 10/29/1995, 08/02/1996, 12/26/2000   HPV 9-valent 12/18/2007, 02/24/2008, 08/15/2008   Hepatitis A 11/10/2013   Hepatitis B 0Apr 27, 1996 07/01/1995, 10/29/1995   HiB (PRP-OMP)  07/01/1995, 09/01/1995, 10/29/1995, 07/23/1996   IPV 07/01/1995, 09/01/1995, 10/29/1995, 12/26/2000   Influenza Nasal 06/17/2005, 07/18/2008   Influenza,inj,Quad PF,6+ Mos 07/30/2018, 07/02/2021   Influenza-Unspecified 07/16/2006   MMR 08/02/1996, 12/26/2000   Meningococcal Conjugate 11/10/2013   Tdap 12/01/2006, 03/30/2020, 08/02/2021   Varicella 05/06/1996, 12/20/2008    5) Disposition - pending delivery  AMalachy Mood MD, [facility] 09/07/2021, 3:47 PM

## 2021-09-07 NOTE — Anesthesia Preprocedure Evaluation (Signed)
Anesthesia Evaluation  Patient identified by MRN, date of birth, ID band Patient awake    Reviewed: Allergy & Precautions, H&P , NPO status , Patient's Chart, lab work & pertinent test results  History of Anesthesia Complications Negative for: history of anesthetic complications  Airway Mallampati: I       Dental no notable dental hx. (+) Teeth Intact   Pulmonary           Cardiovascular      Neuro/Psych PSYCHIATRIC DISORDERS Anxiety Depression    GI/Hepatic   Endo/Other  Hypothyroidism   Renal/GU      Musculoskeletal   Abdominal   Peds  Hematology   Anesthesia Other Findings   Reproductive/Obstetrics (+) Pregnancy                             Anesthesia Physical Anesthesia Plan  ASA: 2  Anesthesia Plan: Epidural   Post-op Pain Management:    Induction:   PONV Risk Score and Plan:   Airway Management Planned:   Additional Equipment:   Intra-op Plan:   Post-operative Plan:   Informed Consent:   Plan Discussed with: Anesthesiologist  Anesthesia Plan Comments:         Anesthesia Quick Evaluation

## 2021-09-07 NOTE — Progress Notes (Signed)
° °  Subjective:  Feeling some contractions  Objective:   Vitals: Blood pressure 120/71, pulse 90, temperature 97.8 F (36.6 C), temperature source Oral, resp. rate 18, height 5\' 9"  (1.753 m), weight 101 kg, last menstrual period 12/16/2020. General: NAD Abdomen: gravid, non-tender Cervical Exam:  Dilation: 4 Effacement (%): 70 Cervical Position: Middle Station: -2 Presentation: Vertex Exam by:: Clotilda Hafer,MD Small fore bag ruptured  FHT: 130, moderate, +accels, decels Toco: q53min  Results for orders placed or performed during the hospital encounter of 09/07/21 (from the past 24 hour(s))  ROM Plus ([facility] only)     Status: None   Collection Time: 09/07/21  2:52 PM  Result Value Ref Range   Rom Plus POSITIVE   Resp Panel by RT-PCR (Flu A&B, Covid) Nasopharyngeal Swab     Status: Abnormal   Collection Time: 09/07/21  2:52 PM   Specimen: Nasopharyngeal Swab; Nasopharyngeal(NP) swabs in vial transport medium  Result Value Ref Range   SARS Coronavirus 2 by RT PCR POSITIVE (A) NEGATIVE   Influenza A by PCR NEGATIVE NEGATIVE   Influenza B by PCR NEGATIVE NEGATIVE  CBC     Status: Abnormal   Collection Time: 09/07/21  4:28 PM  Result Value Ref Range   WBC 13.6 (H) 4.0 - 10.5 K/uL   RBC 3.85 (L) 3.87 - 5.11 MIL/uL   Hemoglobin 12.0 12.0 - 15.0 g/dL   HCT 09/09/21 (L) 73.7 - 10.6 %   MCV 90.4 80.0 - 100.0 fL   MCH 31.2 26.0 - 34.0 pg   MCHC 34.5 30.0 - 36.0 g/dL   RDW 26.9 48.5 - 46.2 %   Platelets 308 150 - 400 K/uL   nRBC 0.0 0.0 - 0.2 %  Type and screen [facility]     Status: None   Collection Time: 09/07/21  4:28 PM  Result Value Ref Range   ABO/RH(D) A POS    Antibody Screen NEG    Sample Expiration      09/10/2021,2359 Performed at [facility], [address], [place], [place]     Assessment:   26 y.o. G1P0000 [redacted]w[redacted]d prelabor rupture of membranes  Plan:   1) Labor  - no cervical change, start pitocin  2) Fetus - cat I  tracing  3) COVID positive - CT value 41, low viral load but no documented prior COVID.  Could be old residual infection or new developing infection.  Per ID and infectious disease treat as positive  [redacted]w[redacted]d, MD, [facility], [facility] 09/07/2021, 5:40 PM

## 2021-09-07 NOTE — Anesthesia Procedure Notes (Signed)
Epidural Patient location during procedure: OB Start time: 09/07/2021 10:15 PM End time: 09/07/2021 10:16 PM  Staffing Anesthesiologist: Lenard Simmer, MD Performed: anesthesiologist   Preanesthetic Checklist Completed: patient identified, IV checked, site marked, risks and benefits discussed, surgical consent, monitors and equipment checked, pre-op evaluation and timeout performed  Epidural Patient position: sitting Prep: ChloraPrep Patient monitoring: heart rate, continuous pulse ox and blood pressure Approach: midline Location: L3-L4 Injection technique: LOR saline  Needle:  Needle type: Tuohy  Needle gauge: 17 G Needle length: 9 cm and 9 Needle insertion depth: 5 cm Catheter type: closed end flexible Catheter size: 19 Gauge Catheter at skin depth: 10 cm Test dose: negative and 1.5% lidocaine with Epi 1:200 K  Assessment Sensory level: T10 Events: blood not aspirated, injection not painful, no injection resistance, no paresthesia and negative IV test  Additional Notes 1st attempt Pt. Evaluated and documentation done after procedure finished. Patient identified. Risks/Benefits/Options discussed with patient including but not limited to bleeding, infection, nerve damage, paralysis, failed block, incomplete pain control, headache, blood pressure changes, nausea, vomiting, reactions to medication both or allergic, itching and postpartum back pain. Confirmed with bedside nurse the patient's most recent platelet count. Confirmed with patient that they are not currently taking any anticoagulation, have any bleeding history or any family history of bleeding disorders. Patient expressed understanding and wished to proceed. All questions were answered. Sterile technique was used throughout the entire procedure. Please see nursing notes for vital signs. Test dose was given through epidural catheter and negative prior to continuing to dose epidural or start infusion. Warning signs of high  block given to the patient including shortness of breath, tingling/numbness in hands, complete motor block, or any concerning symptoms with instructions to call for help. Patient was given instructions on fall risk and not to get out of bed. All questions and concerns addressed with instructions to call with any issues or inadequate analgesia.    Patient tolerated the insertion well without immediate complications.Reason for block:procedure for pain

## 2021-09-08 ENCOUNTER — Encounter: Payer: Self-pay | Admitting: Obstetrics and Gynecology

## 2021-09-08 DIAGNOSIS — O4202 Full-term premature rupture of membranes, onset of labor within 24 hours of rupture: Secondary | ICD-10-CM

## 2021-09-08 DIAGNOSIS — O9903 Anemia complicating the puerperium: Secondary | ICD-10-CM

## 2021-09-08 DIAGNOSIS — U071 COVID-19: Secondary | ICD-10-CM | POA: Diagnosis not present

## 2021-09-08 DIAGNOSIS — O98513 Other viral diseases complicating pregnancy, third trimester: Secondary | ICD-10-CM

## 2021-09-08 DIAGNOSIS — D62 Acute posthemorrhagic anemia: Secondary | ICD-10-CM

## 2021-09-08 DIAGNOSIS — Z3A38 38 weeks gestation of pregnancy: Secondary | ICD-10-CM

## 2021-09-08 LAB — RPR: RPR Ser Ql: NONREACTIVE

## 2021-09-08 MED ORDER — DIPHENHYDRAMINE HCL 25 MG PO CAPS: 25.0000 mg | ORAL_CAPSULE | Freq: Four times a day (QID) | ORAL | Status: DC | PRN

## 2021-09-08 MED ORDER — PRENATAL MULTIVITAMIN CH
1.0000 | ORAL_TABLET | Freq: Every day | ORAL | Status: DC
  Administered 2021-09-08 – 2021-09-09 (×2): 1 via ORAL
  Filled 2021-09-08 (×2): qty 1

## 2021-09-08 MED ORDER — ZOLPIDEM TARTRATE 5 MG PO TABS: 5.0000 mg | ORAL_TABLET | Freq: Every evening | ORAL | Status: DC | PRN

## 2021-09-08 MED ORDER — ONDANSETRON HCL 4 MG/2ML IJ SOLN
4.0000 mg | INTRAMUSCULAR | Status: DC | PRN
  Administered 2021-09-08: 4 mg via INTRAVENOUS
  Filled 2021-09-08: qty 2

## 2021-09-08 MED ORDER — IBUPROFEN 600 MG PO TABS
600.0000 mg | ORAL_TABLET | Freq: Four times a day (QID) | ORAL | Status: DC
  Administered 2021-09-08 – 2021-09-10 (×8): 600 mg via ORAL
  Filled 2021-09-08 (×8): qty 1

## 2021-09-08 MED ORDER — BENZOCAINE-MENTHOL 20-0.5 % EX AERO
1.0000 "application " | INHALATION_SPRAY | CUTANEOUS | Status: DC | PRN
  Administered 2021-09-08: 1 via TOPICAL
  Filled 2021-09-08 (×3): qty 56

## 2021-09-08 MED ORDER — LEVOTHYROXINE SODIUM 112 MCG PO TABS
112.0000 ug | ORAL_TABLET | Freq: Every day | ORAL | Status: DC
  Administered 2021-09-09 – 2021-09-10 (×2): 112 ug via ORAL
  Filled 2021-09-08 (×3): qty 1

## 2021-09-08 MED ORDER — DIBUCAINE (PERIANAL) 1 % EX OINT
1.0000 "application " | TOPICAL_OINTMENT | CUTANEOUS | Status: DC | PRN
  Filled 2021-09-08 (×2): qty 28

## 2021-09-08 MED ORDER — ONDANSETRON HCL 4 MG PO TABS: 4.0000 mg | ORAL_TABLET | ORAL | Status: DC | PRN

## 2021-09-08 MED ORDER — DOCUSATE SODIUM 100 MG PO CAPS
100.0000 mg | ORAL_CAPSULE | Freq: Two times a day (BID) | ORAL | Status: DC
  Administered 2021-09-08 – 2021-09-10 (×4): 100 mg via ORAL
  Filled 2021-09-08 (×4): qty 1

## 2021-09-08 MED ORDER — WITCH HAZEL-GLYCERIN EX PADS
1.0000 "application " | MEDICATED_PAD | CUTANEOUS | Status: DC | PRN
  Administered 2021-09-08: 1 via TOPICAL
  Filled 2021-09-08 (×4): qty 100

## 2021-09-08 MED ORDER — SIMETHICONE 80 MG PO CHEW: 80.0000 mg | CHEWABLE_TABLET | ORAL | Status: DC | PRN

## 2021-09-08 MED ORDER — COCONUT OIL OIL
1.0000 "application " | TOPICAL_OIL | Status: DC | PRN
  Filled 2021-09-08: qty 120

## 2021-09-08 MED ORDER — ACETAMINOPHEN 500 MG PO TABS
1000.0000 mg | ORAL_TABLET | Freq: Four times a day (QID) | ORAL | Status: DC
  Administered 2021-09-08 – 2021-09-09 (×4): 1000 mg via ORAL
  Filled 2021-09-08 (×4): qty 2

## 2021-09-08 MED ORDER — CEFAZOLIN SODIUM-DEXTROSE 2-4 GM/100ML-% IV SOLN
2.0000 g | Freq: Once | INTRAVENOUS | Status: AC
  Administered 2021-09-08: 2 g via INTRAVENOUS
  Filled 2021-09-08: qty 100

## 2021-09-08 MED ORDER — CITALOPRAM HYDROBROMIDE 20 MG PO TABS
20.0000 mg | ORAL_TABLET | Freq: Every day | ORAL | Status: DC
  Administered 2021-09-09 – 2021-09-10 (×2): 20 mg via ORAL
  Filled 2021-09-08 (×3): qty 1

## 2021-09-08 NOTE — Progress Notes (Signed)
° °  Subjective:  Epidural in place, comfortable  Objective:   Vitals: Blood pressure 124/78, pulse 86, temperature 98.1 F (36.7 C), temperature source Oral, resp. rate 17, height 5\' 9"  (1.753 m), weight 101 kg, last menstrual period 12/16/2020. General: NAD Abdomen: gravid, non-tender Cervical Exam:  Dilation: 5 Effacement (%): 100 Cervical Position: Anterior Station: 0 Presentation: Vertex Exam by:: Caton Popowski MD IUPC placed  FHT: 130, moderate, +accels, no decels Toco: q2-41min  Results for orders placed or performed during the hospital encounter of 09/07/21 (from the past 24 hour(s))  ROM Plus ([facility] only)     Status: None   Collection Time: 09/07/21  2:52 PM  Result Value Ref Range   Rom Plus POSITIVE   Resp Panel by RT-PCR (Flu A&B, Covid) Nasopharyngeal Swab     Status: Abnormal   Collection Time: 09/07/21  2:52 PM   Specimen: Nasopharyngeal Swab; Nasopharyngeal(NP) swabs in vial transport medium  Result Value Ref Range   SARS Coronavirus 2 by RT PCR POSITIVE (A) NEGATIVE   Influenza A by PCR NEGATIVE NEGATIVE   Influenza B by PCR NEGATIVE NEGATIVE  CBC     Status: Abnormal   Collection Time: 09/07/21  4:28 PM  Result Value Ref Range   WBC 13.6 (H) 4.0 - 10.5 K/uL   RBC 3.85 (L) 3.87 - 5.11 MIL/uL   Hemoglobin 12.0 12.0 - 15.0 g/dL   HCT 09/09/21 (L) 28.4 - 13.2 %   MCV 90.4 80.0 - 100.0 fL   MCH 31.2 26.0 - 34.0 pg   MCHC 34.5 30.0 - 36.0 g/dL   RDW 44.0 10.2 - 72.5 %   Platelets 308 150 - 400 K/uL   nRBC 0.0 0.0 - 0.2 %  Type and screen [facility]     Status: None   Collection Time: 09/07/21  4:28 PM  Result Value Ref Range   ABO/RH(D) A POS    Antibody Screen NEG    Sample Expiration      09/10/2021,2359 Performed at [facility], [address] [place], [place]     Assessment:   26 y.o. G1P0000 [redacted]w[redacted]d  SROM at term  Plan:   1) Labor - continue to titrate pitocin as needed  2) Fetus - cat I tracing  [redacted]w[redacted]d, MD, Vena Austria [facility], [facility] 09/08/2021, 1:01 AM

## 2021-09-08 NOTE — Discharge Summary (Signed)
° °  Postpartum Discharge Summary ° °Date of Service updated 09/10/2021 °  °Patient Name: Debra [name] °DOB: 08/04/1996 °MRN: [id number] ° °Date of admission: 09/07/2021 °Delivery date:09/08/2021  °Delivering provider: SCHUMAN, CHRISTANNA R  °Date of discharge: 09/10/2021 ° °Admitting diagnosis: Labor and delivery indication for care or intervention [O75.9] °Amniotic fluid leaking [O42.90] °Intrauterine pregnancy: [redacted]w[redacted]d     °Secondary diagnosis:  Principal Problem: °  Labor and delivery indication for care or intervention °Active Problems: °  Amniotic fluid leaking °  Postpartum care following vaginal delivery ° °Additional problems: none    °Discharge diagnosis: Term Pregnancy Delivered                                              °Post partum procedures: none °Augmentation: Pitocin °Complications: None ° °Hospital course: Onset of Labor With Vaginal Delivery      °26 y.o. yo G1P0000 at [redacted]w[redacted]d was admitted in Latent Labor on 09/07/2021. Patient had an uncomplicated labor course as follows:  °Membrane Rupture Time/Date: 5:31 PM ,09/07/2021   °Delivery Method:Vaginal, Spontaneous  °Episiotomy: None  °Lacerations:  1st degree;Periurethral  °Patient had an uncomplicated postpartum course.  She is ambulating, tolerating a regular diet, passing flatus, and urinating well. Patient is discharged home in stable condition on 09/10/21. ° °Newborn Data: °Birth date:09/08/2021  °Birth time:8:47 AM  °Gender:Female  °Living status:Living  °Apgars:7 ,9  °Weight:3140 g  ° °Magnesium Sulfate received: No °BMZ received: No °Rhophylac:No °MMR:No °T-DaP:Given prenatally °Flu: Yes °Transfusion:No   ° °Physical exam  °Vitals:  ° 09/09/21 0839 09/09/21 1608 09/09/21 2258 09/10/21 0830  °BP: 119/73 120/82 119/82 134/84  °Pulse: 79 66 67 68  °Resp: 18 18 19 18  °Temp: 97.9 °F (36.6 °C) 98.7 °F (37.1 °C) 98 °F (36.7 °C) 98.3 °F (36.8 °C)  °TempSrc: Oral Oral Oral Oral  °SpO2: 99% 97% 99% 99%  °Weight:      °Height:      ° °General: alert,  cooperative, and no distress °Lochia: appropriate °Uterine Fundus: firm °Incision: N/A °DVT Evaluation: No evidence of DVT seen on physical exam. °Labs: °Lab Results  °Component Value Date  ° WBC 13.5 (H) 09/09/2021  ° HGB 10.8 (L) 09/09/2021  ° HCT 31.8 (L) 09/09/2021  ° MCV 90.3 09/09/2021  ° PLT 264 09/09/2021  ° °CMP Latest Ref Rng & Units 05/30/2020  °Glucose 65 - 99 mg/dL 81  °BUN 6 - 20 mg/dL 10  °Creatinine 0.57 - 1.00 mg/dL 0.75  °Sodium 134 - 144 mmol/L 140  °Potassium 3.5 - 5.2 mmol/L 4.0  °Chloride 96 - 106 mmol/L 101  °CO2 20 - 29 mmol/L 26  °Calcium 8.7 - 10.2 mg/dL 9.8  °Total Protein 6.0 - 8.5 g/dL 7.0  °Total Bilirubin 0.0 - 1.2 mg/dL 0.3  °Alkaline Phos 44 - 121 IU/L 70  °AST 0 - 40 IU/L 13  °ALT 0 - 32 IU/L 7  ° °Edinburgh Score: °Edinburgh Postnatal Depression Scale Screening Tool 09/10/2021  °I have been able to laugh and see the funny side of things. (No Data)  ° ° ° ° °After visit meds:  °Allergies as of 09/10/2021   ° °   Reactions  ° Sulfa Antibiotics Rash  ° °  ° °  °Medication List  °  ° °TAKE these medications   ° °citalopram 20 MG tablet °Commonly known as: CELEXA °TAKE 1   TABLET BY MOUTH EVERY DAY °  °clobetasol 0.05 % topical foam °Commonly known as: OLUX °Apply topically 2 (two) times daily. °  °ibuprofen 600 MG tablet °Commonly known as: ADVIL °Take 1 tablet (600 mg total) by mouth every 6 (six) hours as needed. °  °levothyroxine 112 MCG tablet °Commonly known as: SYNTHROID °TAKE 1 TABLET BY MOUTH DAILY BEFORE BREAKFAST. °  °ondansetron 4 MG disintegrating tablet °Commonly known as: Zofran ODT °Take 1 tablet (4 mg total) by mouth every 6 (six) hours as needed for nausea. °  °prenatal multivitamin Tabs tablet °Take 1 tablet by mouth daily at 12 noon. °  ° °  ° °  °  ° ° °  °Discharge Care Instructions  °(From admission, onward)  °  ° ° °  ° °  Start     Ordered  ° 09/09/21 0000  Discharge wound care:       °Comments: SHOWER DAILY °Wash incision gently with soap and water.  °Call office  with any drainage, redness, or firmness of the incision.  ° 09/09/21 0855  ° °  °  ° °  ° ° ° °Discharge home in stable condition °Infant Feeding: Bottle and Breast °Infant Disposition:home with mother °Discharge instruction: per After Visit Summary and Postpartum booklet. °Activity: Advance as tolerated. Pelvic rest for 6 weeks.  °Diet: routine diet °Anticipated Birth Control: Unsure °Postpartum Appointment:2 weeks °Additional Postpartum F/U:    °Future Appointments: °Future Appointments  °Date Time Provider Department Center  °09/18/2021 10:15 AM Schuman, Christanna R, MD [facility] None  °01/04/2022  4:00 PM Gherghe, Cristina, MD [facility] None  °03/04/2022 10:00 AM Bacigalupo, Angela M, MD [facility]  ° °Follow up Visit: ° Follow-up Information   ° ° Schuman, Christanna R, MD. Schedule an appointment as soon as possible for a visit in 2 week(s).   °Specialty: Obstetrics and Gynecology °Why: postpartum care °Contact information: °[address] °[place] [address] °[phone] ° ° °  °  ° °  °  ° °  ° ° ° °  ° °09/10/2021 °Margaret M Fryer, CNM ° ° °

## 2021-09-09 LAB — CBC
HCT: 31.8 % — ABNORMAL LOW (ref 36.0–46.0)
Hemoglobin: 10.8 g/dL — ABNORMAL LOW (ref 12.0–15.0)
MCH: 30.7 pg (ref 26.0–34.0)
MCHC: 34 g/dL (ref 30.0–36.0)
MCV: 90.3 fL (ref 80.0–100.0)
Platelets: 264 10*3/uL (ref 150–400)
RBC: 3.52 MIL/uL — ABNORMAL LOW (ref 3.87–5.11)
RDW: 12.9 % (ref 11.5–15.5)
WBC: 13.5 10*3/uL — ABNORMAL HIGH (ref 4.0–10.5)
nRBC: 0 % (ref 0.0–0.2)

## 2021-09-09 MED ORDER — IBUPROFEN 600 MG PO TABS: 600.0000 mg | ORAL_TABLET | Freq: Four times a day (QID) | ORAL | 0 refills | Status: DC | PRN

## 2021-09-09 MED ORDER — ACETAMINOPHEN 500 MG PO TABS
1000.0000 mg | ORAL_TABLET | Freq: Four times a day (QID) | ORAL | Status: DC
  Administered 2021-09-09 – 2021-09-10 (×4): 1000 mg via ORAL
  Filled 2021-09-09 (×4): qty 2

## 2021-09-09 NOTE — Progress Notes (Signed)
Subjective:  She is feeling well. Pain control is adequate. Voiding without difficulty. Tolerating a regular diet. Ambulating well.  Objective:   Blood pressure 119/73, pulse 76, temperature 97.9 F (36.6 C), temperature source Oral, resp. rate 18, height 5' 9"  (1.753 m), weight 101 kg, last menstrual period 12/16/2020, SpO2 98 %, unknown if currently breastfeeding.  General: NAD Pulmonary: no increased work of breathing Abdomen: non-distended, non-tender Uterus:  fundus firm at U; lochia normal Extremities: no edema, no erythema, no tenderness, no signs of DVT  Results for orders placed or performed during the hospital encounter of 09/07/21 (from the past 72 hour(s))  ROM Plus ([place] only)     Status: None   Collection Time: 09/07/21  2:52 PM  Result Value Ref Range   Rom Plus POSITIVE     Comment: Performed at [facility], [address] [place], [place] [address]  Resp Panel by RT-PCR (Flu A&B, Covid) Nasopharyngeal Swab     Status: Abnormal   Collection Time: 09/07/21  2:52 PM   Specimen: Nasopharyngeal Swab; Nasopharyngeal(NP) swabs in vial transport medium  Result Value Ref Range   SARS Coronavirus 2 by RT PCR POSITIVE (A) NEGATIVE    Comment: (NOTE) SARS-CoV-2 target nucleic acids are DETECTED.  The SARS-CoV-2 RNA is generally detectable in upper respiratory specimens during the acute phase of infection. Positive results are indicative of the presence of the identified virus, but do not rule out bacterial infection or co-infection with other pathogens not detected by the test. Clinical correlation with patient history and other diagnostic information is necessary to determine patient infection status. The expected result is Negative.  Fact Sheet for Patients: EntrepreneurPulse.com.au  Fact Sheet for Healthcare Providers: IncredibleEmployment.be  This test is not yet approved or cleared by the [place] FDA and  has  been authorized for detection and/or diagnosis of SARS-CoV-2 by FDA under an Emergency Use Authorization (EUA).  This EUA will remain in effect (meaning this test can be used) for the duration of  the COVID-19 declaration under Section 564(b)(1) of the A ct, 21 U.S.C. section 360bbb-3(b)(1), unless the authorization is terminated or revoked sooner.     Influenza A by PCR NEGATIVE NEGATIVE   Influenza B by PCR NEGATIVE NEGATIVE    Comment: (NOTE) The Xpert Xpress SARS-CoV-2/FLU/RSV plus assay is intended as an aid in the diagnosis of influenza from Nasopharyngeal swab specimens and should not be used as a sole basis for treatment. Nasal washings and aspirates are unacceptable for Xpert Xpress SARS-CoV-2/FLU/RSV testing.  Fact Sheet for Patients: EntrepreneurPulse.com.au  Fact Sheet for Healthcare Providers: IncredibleEmployment.be  This test is not yet approved or cleared by the [place] FDA and has been authorized for detection and/or diagnosis of SARS-CoV-2 by FDA under an Emergency Use Authorization (EUA). This EUA will remain in effect (meaning this test can be used) for the duration of the COVID-19 declaration under Section 564(b)(1) of the Act, 21 U.S.C. section 360bbb-3(b)(1), unless the authorization is terminated or revoked.  Performed at [facility], [address] [place], [place] [address]   CBC     Status: Abnormal   Collection Time: 09/07/21  4:28 PM  Result Value Ref Range   WBC 13.6 (H) 4.0 - 10.5 K/uL   RBC 3.85 (L) 3.87 - 5.11 MIL/uL   Hemoglobin 12.0 12.0 - 15.0 g/dL   HCT 34.8 (L) 36.0 - 46.0 %   MCV 90.4 80.0 - 100.0 fL   MCH 31.2 26.0 - 34.0 pg  MCHC 34.5 30.0 - 36.0 g/dL   RDW 12.8 11.5 - 15.5 %   Platelets 308 150 - 400 K/uL   nRBC 0.0 0.0 - 0.2 %    Comment: Performed at [facility], [address] [place], [place] [address]  Type and screen [place]      Status: None   Collection Time: 09/07/21  4:28 PM  Result Value Ref Range   ABO/RH(D) A POS    Antibody Screen NEG    Sample Expiration      09/10/2021,2359 Performed at [facility], [address] [place], [place] [address]   RPR     Status: None   Collection Time: 09/07/21  4:28 PM  Result Value Ref Range   RPR Ser Ql NON REACTIVE NON REACTIVE    Comment: Performed at [facility], [place] [address] [place], [place] [address]  CBC     Status: Abnormal   Collection Time: 09/09/21  5:50 AM  Result Value Ref Range   WBC 13.5 (H) 4.0 - 10.5 K/uL   RBC 3.52 (L) 3.87 - 5.11 MIL/uL   Hemoglobin 10.8 (L) 12.0 - 15.0 g/dL   HCT 31.8 (L) 36.0 - 46.0 %   MCV 90.3 80.0 - 100.0 fL   MCH 30.7 26.0 - 34.0 pg   MCHC 34.0 30.0 - 36.0 g/dL   RDW 12.9 11.5 - 15.5 %   Platelets 264 150 - 400 K/uL   nRBC 0.0 0.0 - 0.2 %    Comment: Performed at [facility], [address] [place],  [address]    Assessment:   26 y.o. G1P1001 postpartum day # 1  Plan:   1) Acute blood loss anemia - hemodynamically stable and asymptomatic - po ferrous sulfate  2) Blood Type --/--/A POS (01/20 1628)   3) Rubella 2.60 (06/15 1117) / Varicella Immune  4) TDAP status - up to date Immunization History  Administered Date(s) Administered   DTaP 07/01/1995, 09/01/1995, 10/29/1995, 08/02/1996, 12/26/2000   HPV 9-valent 12/18/2007, 02/24/2008, 08/15/2008   Hepatitis A 11/10/2013   Hepatitis B 23-Oct-1994, 07/01/1995, 10/29/1995   HiB (PRP-OMP) 07/01/1995, 09/01/1995, 10/29/1995, 07/23/1996   IPV 07/01/1995, 09/01/1995, 10/29/1995, 12/26/2000   Influenza Nasal 06/17/2005, 07/18/2008   Influenza,inj,Quad PF,6+ Mos 07/30/2018, 07/02/2021   Influenza-Unspecified 07/16/2006   MMR 08/02/1996, 12/26/2000   Meningococcal Conjugate 11/10/2013   Tdap 12/01/2006, 03/30/2020, 08/02/2021   Varicella 05/06/1996, 12/20/2008    5) Feeding- breast/bottle   6) Contraception - not  discussed  7) Disposition - patient may be able to be discharged home today depending on the infant  Adrian Prows MD [place] 09/09/2021 8:50 AM

## 2021-09-09 NOTE — Anesthesia Post-op Follow-up Note (Signed)
°  Anesthesia Pain Follow-up Note  Patient: Debra [name]  Day #: 1  Date of Follow-up: 09/09/2021 Time: 12:22 PM  Last Vitals:  Vitals:   09/09/21 0451 09/09/21 0839  BP: 115/79 119/73  Pulse: 76 79  Resp: 18 18  Temp: 36.5 C 36.6 C  SpO2: 98% 99%    Level of Consciousness: alert  Pain: mild   Side Effects:None  Catheter Site Exam:clean, dry     Plan: D/C from anesthesia care at surgeon's request  Lenard Simmer

## 2021-09-09 NOTE — Anesthesia Postprocedure Evaluation (Signed)
Anesthesia Post Note  Patient: Debra [name]  Procedure(s) Performed: AN AD HOC LABOR EPIDURAL  Patient location during evaluation: Mother Baby Anesthesia Type: Epidural Level of consciousness: awake and alert Pain management: pain level controlled Vital Signs Assessment: post-procedure vital signs reviewed and stable Respiratory status: spontaneous breathing, nonlabored ventilation and respiratory function stable Cardiovascular status: stable Postop Assessment: no headache, no backache, no apparent nausea or vomiting and able to ambulate Anesthetic complications: no   No notable events documented.   Last Vitals:  Vitals:   09/09/21 0451 09/09/21 0839  BP: 115/79 119/73  Pulse: 76 79  Resp: 18 18  Temp: 36.5 C 36.6 C  SpO2: 98% 99%    Last Pain:  Vitals:   09/09/21 1140  TempSrc:   PainSc: 1                  Lenard Simmer

## 2021-09-10 NOTE — Discharge Instructions (Signed)

## 2021-09-10 NOTE — Progress Notes (Signed)
Patient discharged home with family.  Discharge instructions, when to follow up, and prescriptions reviewed with patient.  Patient verbalized understanding. Patient will be escorted out by staff.   

## 2021-09-10 NOTE — Lactation Note (Signed)
This note was copied from a baby's chart. Lactation Consultation Note  Patient Name: Debra [name] MCNOB'S Date: 09/10/2021 Reason for consult: Initial assessment;Primapara;Early term 27-38.6wks Age:26 years  Initial lactation visit for P1 who delivered vaginally 52hrs ago. Baby initially had trouble sustaining blood glucose levels above 40, was given glucose gel and instructed on supplementation with formula. Glucose levels have stabilized, baby was circumcised a few minutes ago, and is pending discharge this afternoon. Mom has been set-up with a DEBP and has started to pump every 3hrs with some output at this time.  Mom desired LC to observe her position and latching baby as she wants to exclusively BF and move away from formula supplementation. Baby did wake when the blankets were removed. Mom was independent in positioning baby in good alignment for football hold, and sandwiching the breast tissue. Baby did latch but held nipple in mouth and fell asleep- we discussed how this was normal behavior post circ.  LC and mom discussed a feeding plan to lead her back to exclusive breastfeeding. -Encouraged to continue the recommended supplementation until follow-up with pediatrician  -In the meantime, continue to put baby to breast 8-12x in 24 hours, both breasts, and pump post feedings while dad offers supplement. Provided guidance of catching baby when first waking/stirring, and when he is still calm for best chance of good and sustained latch.  -Track pee/poop diapers -slowly taper the formula volume based on MD advice. Outpatient lactation information given; encouraged to call or make appt for ongoing BF support to reach ultimate goals.  Maternal Data Has patient been taught Hand Expression?: Yes Does the patient have breastfeeding experience prior to this delivery?: No  Feeding Mother's Current Feeding Choice: Breast Milk  LATCH Score Latch: Repeated attempts needed to sustain latch,  nipple held in mouth throughout feeding, stimulation needed to elicit sucking reflex.  Audible Swallowing: None (sleepy)  Type of Nipple: Everted at rest and after stimulation  Comfort (Breast/Nipple): Soft / non-tender  Hold (Positioning): No assistance needed to correctly position infant at breast.  LATCH Score: 7   Lactation Tools Discussed/Used Tools: Bottle;Supplemental Nutrition System;Pump Breast pump type: Double-Electric Breast Pump Pump Education: Other (comment) (set-up over weekend) Reason for Pumping: stimulation/supplementation Pumping frequency: q 3hrs  Interventions Interventions: Breast feeding basics reviewed;Assisted with latch;DEBP;Hand pump;Education  Discharge Discharge Education: Engorgement and breast care;Outpatient recommendation Pump: Personal  Consult Status Consult Status: Complete    Danford Bad 09/10/2021, 1:20 PM

## 2021-09-12 ENCOUNTER — Encounter: Payer: BC Managed Care – PPO | Admitting: Obstetrics and Gynecology

## 2021-09-18 ENCOUNTER — Encounter: Payer: BC Managed Care – PPO | Admitting: Obstetrics and Gynecology

## 2021-09-19 ENCOUNTER — Ambulatory Visit: Payer: BC Managed Care – PPO | Admitting: Obstetrics and Gynecology

## 2021-09-19 ENCOUNTER — Ambulatory Visit (INDEPENDENT_AMBULATORY_CARE_PROVIDER_SITE_OTHER): Payer: BC Managed Care – PPO | Admitting: Licensed Practical Nurse

## 2021-09-19 ENCOUNTER — Encounter: Payer: Self-pay | Admitting: Licensed Practical Nurse

## 2021-09-19 ENCOUNTER — Other Ambulatory Visit: Payer: Self-pay

## 2021-09-19 DIAGNOSIS — Z1332 Encounter for screening for maternal depression: Secondary | ICD-10-CM

## 2021-09-19 NOTE — Progress Notes (Addendum)
Postpartum Visit  Chief Complaint:  Chief Complaint  Patient presents with   Postpartum Care    Headaches    History of Present Illness: Patient is a 26 y.o. G1P1001 presents for postpartum visit. Bleeding seems to have stopped. Feels a pinching pain near her urethra where the stitch is.   Date of delivery: 09/08/21 Type of delivery: Vaginal delivery - Vacuum or forceps assisted  no Episiotomy No.  Laceration: yes. First degree and periurethral  Pregnancy or labor problems:  no Any problems since the delivery:  no  Newborn Details:  SINGLETON :  1. Baby's name: Micah. Birth weight: 6lbs 14.8 oz Maternal Details:  Breast Feeding:  yes, pumping and giving 2oz EBM after every feed-infant with jaundice.  Post partum depression/anxiety noted:  score 10, Makaia feels she has a good handle on things.  Does have a little anxiety-but  it is not disrupting her. Is taking medication for anxiety. Sleep as expected with a new born.  Edinburgh Post-Partum Depression Score:  10  Date of last PAP: 03/27/2020  normal   Past Medical History:  Diagnosis Date   Allergy    Anxiety    Depression    History of irregular menstrual cycles    Irregular menses    Thyroid disease     Past Surgical History:  Procedure Laterality Date   TONSILLECTOMY AND ADENOIDECTOMY  2012   TOTAL THYROIDECTOMY      Prior to Admission medications   Medication Sig Start Date End Date Taking? Authorizing Provider  citalopram (CELEXA) 20 MG tablet TAKE 1 TABLET BY MOUTH EVERY DAY 08/03/21  Yes Bacigalupo, Marzella Schlein, MD  clobetasol (OLUX) 0.05 % topical foam Apply topically 2 (two) times daily. 05/30/20  Yes Bacigalupo, Marzella Schlein, MD  levothyroxine (SYNTHROID) 112 MCG tablet TAKE 1 TABLET BY MOUTH DAILY BEFORE BREAKFAST. 08/02/21  Yes Carlus Pavlov, MD  Prenatal Vit-Fe Fumarate-FA (PRENATAL MULTIVITAMIN) TABS tablet Take 1 tablet by mouth daily at 12 noon.   Yes [provider]  ibuprofen (ADVIL) 600 MG  tablet Take 1 tablet (600 mg total) by mouth every 6 (six) hours as needed. Patient not taking: Reported on 09/19/2021 09/09/21   Natale Milch, MD  ondansetron (ZOFRAN ODT) 4 MG disintegrating tablet Take 1 tablet (4 mg total) by mouth every 6 (six) hours as needed for nausea. Patient not taking: Reported on 09/19/2021 06/04/21   Vena Austria, MD    Allergies  Allergen Reactions   Sulfa Antibiotics Rash     Social History   Socioeconomic History   Marital status: Married    Spouse name: Molli Hazard   Number of children: 0   Years of education: Not on file   Highest education level: Not on file  Occupational History   Occupation: Engineering geologist of [employer]  Tobacco Use   Smoking status: Never   Smokeless tobacco: Never  Vaping Use   Vaping Use: Never used  Substance and Sexual Activity   Alcohol use: Not Currently    Comment: rarely   Drug use: No   Sexual activity: Yes    Partners: Male    Birth control/protection: Pill  Other Topics Concern   Not on file  Social History Narrative   Not on file   Social Determinants of Health   Financial Resource Strain: Not on file  Food Insecurity: Not on file  Transportation Needs: Not on file  Physical Activity: Not on file  Stress: Not on file  Social Connections: Not on  file  Intimate Partner Violence: Not on file    Family History  Problem Relation Age of Onset   Hypertension Father    Gout Father    Hypertension Maternal Grandfather    Stroke Maternal Grandfather    Arthritis Paternal Grandmother    Gout Paternal Grandmother    Hypertension Paternal Grandmother    Melanoma Paternal Grandmother    Intellectual disability Paternal Grandfather    Heart Problems Paternal Grandfather        CABG x4   Thyroid disease Other     Review of Systems  Constitutional: Negative.   Respiratory: Negative.    Cardiovascular: Negative.   Gastrointestinal: Negative.   Genitourinary: Negative.   Musculoskeletal:  Negative.   Skin: Negative.   Neurological: Negative.   Endo/Heme/Allergies: Negative.   Psychiatric/Behavioral:  The patient is nervous/anxious.     Physical Exam BP 102/70    Ht 5\' 9"  (1.753 m)    Wt 202 lb (91.6 kg)    Breastfeeding Yes    BMI 29.83 kg/m   Physical Exam Genitourinary:     Vulva normal.     Genitourinary Comments: Stitch partially removed near urethra. Labia well healed    Pulmonary:     Effort: Pulmonary effort is normal.  Abdominal:     General: Abdomen is flat.  Neurological:     Mental Status: She is alert.    Breasts:lactating, no redness or masses. Nipple erect and intact intact bilaterally   Assessment: 26 y.o. G1P1001 presenting for 2 week postpartum visit  Plan: Problem List Items Addressed This Visit   None    1) Contraception Education given regarding options for contraception, including barrier methods, IUD placement, oral contraceptives. Currently undecided, was on the pill once and did like the side effects.   2)  Pap - due in 2024  3) Patient underwent screening for postpartum depression with no concerns noted-knows to call with any concerns.  4) Follow up in 4 weeks for 6wk PP exam  2025, CNM  Carie Caddy, [facility]  09/19/21  1:03 PM

## 2021-10-19 ENCOUNTER — Ambulatory Visit: Payer: BC Managed Care – PPO | Admitting: Obstetrics and Gynecology

## 2021-10-25 ENCOUNTER — Other Ambulatory Visit: Payer: Self-pay

## 2021-10-25 ENCOUNTER — Encounter: Payer: Self-pay | Admitting: Obstetrics and Gynecology

## 2021-10-25 ENCOUNTER — Ambulatory Visit (INDEPENDENT_AMBULATORY_CARE_PROVIDER_SITE_OTHER): Payer: BC Managed Care – PPO | Admitting: Obstetrics and Gynecology

## 2021-10-25 DIAGNOSIS — Z308 Encounter for other contraceptive management: Secondary | ICD-10-CM

## 2021-10-25 MED ORDER — PHEXXI 1.8-1-0.4 % VA GEL: 1.0000 | VAGINAL | 11 refills | Status: DC | PRN

## 2021-10-25 NOTE — Progress Notes (Signed)
? ?Postpartum Visit  ?Chief Complaint:  ?Chief Complaint  ?Patient presents with  ? Postpartum Care  ? ? ?History of Present Illness: Patient is a 26 y.o. G1P1001 presents for postpartum visit. ? ?Date of delivery: 09/08/2021 ?Type of delivery: Vaginal ?Laceration: 1st degree perineal and periurethral  ? ?Breast Feeding:  yes ?Lochia: normal   ?Edinburgh Post-Partum Depression Score: 11  ?Date of last PAP: 03/27/2020  normal  ? ?She reports she has been feeling well. She feels like her mood is normal. She has two bleeding events with 2 days of bleeding at 4 weeks postpartum and 6 weeks postpartum ? ?Newborn Details:  ?SINGLETON :  ?1. Infant Status: Infant doing well at home with mother. ? ? ?Review of Systems: Review of Systems  ?Constitutional:  Negative for chills, fever, malaise/fatigue and weight loss.  ?HENT:  Negative for congestion, hearing loss and sinus pain.   ?Eyes:  Negative for blurred vision and double vision.  ?Respiratory:  Negative for cough, sputum production, shortness of breath and wheezing.   ?Cardiovascular:  Negative for chest pain, palpitations, orthopnea and leg swelling.  ?Gastrointestinal:  Negative for abdominal pain, constipation, diarrhea, nausea and vomiting.  ?Genitourinary:  Negative for dysuria, flank pain, frequency, hematuria and urgency.  ?Musculoskeletal:  Negative for back pain, falls and joint pain.  ?Skin:  Negative for itching and rash.  ?Neurological:  Negative for dizziness and headaches.  ?Psychiatric/Behavioral:  Negative for depression, substance abuse and suicidal ideas. The patient is not nervous/anxious.   ? ?Past Medical History:  ?Past Medical History:  ?Diagnosis Date  ? Allergy   ? Anxiety   ? Depression   ? History of irregular menstrual cycles   ? Irregular menses   ? Thyroid disease   ? ? ?Past Surgical History:  ?Past Surgical History:  ?Procedure Laterality Date  ? TONSILLECTOMY AND ADENOIDECTOMY  2012  ? TOTAL THYROIDECTOMY    ? ? ?Family History:  ?Family  History  ?Problem Relation Age of Onset  ? Hypertension Father   ? Gout Father   ? Hypertension Maternal Grandfather   ? Stroke Maternal Grandfather   ? Arthritis Paternal Grandmother   ? Gout Paternal Grandmother   ? Hypertension Paternal Grandmother   ? Melanoma Paternal Grandmother   ? Intellectual disability Paternal Grandfather   ? Heart Problems Paternal Grandfather   ?     CABG x4  ? Thyroid disease Other   ? ? ?Social History:  ?Social History  ? ?Socioeconomic History  ? Marital status: Married  ?  Spouse name: Molli Hazard  ? Number of children: 0  ? Years of education: Not on file  ? Highest education level: Not on file  ?Occupational History  ? Occupation: Engineering geologist of Dance  ?Tobacco Use  ? Smoking status: Never  ? Smokeless tobacco: Never  ?Vaping Use  ? Vaping Use: Never used  ?Substance and Sexual Activity  ? Alcohol use: Not Currently  ?  Comment: rarely  ? Drug use: No  ? Sexual activity: Yes  ?  Partners: Male  ?  Birth control/protection: Pill  ?Other Topics Concern  ? Not on file  ?Social History Narrative  ? Not on file  ? ?Social Determinants of Health  ? ?Financial Resource Strain: Not on file  ?Food Insecurity: Not on file  ?Transportation Needs: Not on file  ?Physical Activity: Not on file  ?Stress: Not on file  ?Social Connections: Not on file  ?Intimate Partner Violence: Not on file  ? ? ?  Allergies:  ?Allergies  ?Allergen Reactions  ? Sulfa Antibiotics Rash  ? ? ?Medications: ?Prior to Admission medications   ?Medication Sig Start Date End Date Taking? Authorizing Provider  ?citalopram (CELEXA) 20 MG tablet TAKE 1 TABLET BY MOUTH EVERY DAY 08/03/21  Yes Bacigalupo, Marzella Schlein, MD  ?clobetasol (OLUX) 0.05 % topical foam Apply topically 2 (two) times daily. 05/30/20  Yes Bacigalupo, Marzella Schlein, MD  ?levothyroxine (SYNTHROID) 112 MCG tablet TAKE 1 TABLET BY MOUTH DAILY BEFORE BREAKFAST. 08/02/21  Yes Carlus Pavlov, MD  ?Prenatal Vit-Fe Fumarate-FA (PRENATAL MULTIVITAMIN) TABS tablet  Take 1 tablet by mouth daily at 12 noon.   Yes [provider]  ?ibuprofen (ADVIL) 600 MG tablet Take 1 tablet (600 mg total) by mouth every 6 (six) hours as needed. ?Patient not taking: Reported on 10/25/2021 09/09/21   Natale Milch, MD  ?ondansetron (ZOFRAN ODT) 4 MG disintegrating tablet Take 1 tablet (4 mg total) by mouth every 6 (six) hours as needed for nausea. ?Patient not taking: Reported on 09/19/2021 06/04/21   Vena Austria, MD  ? ? ?Physical Exam ?Vitals:  ?Vitals:  ? 10/25/21 1524  ?BP: 120/80  ? ? ?Physical Exam ?Constitutional:   ?   Appearance: She is well-developed.  ?Genitourinary:  ?   Genitourinary Comments: External: Normal appearing vulva. No lesions noted.  ?  ?HENT:  ?   Head: Normocephalic and atraumatic.  ?Neck:  ?   Thyroid: No thyromegaly.  ?Cardiovascular:  ?   Rate and Rhythm: Normal rate and regular rhythm.  ?   Heart sounds: Normal heart sounds.  ?Pulmonary:  ?   Effort: Pulmonary effort is normal.  ?   Breath sounds: Normal breath sounds.  ?Abdominal:  ?   General: Bowel sounds are normal. There is no distension.  ?   Palpations: Abdomen is soft. There is no mass.  ?Musculoskeletal:  ?   Cervical back: Neck supple.  ?Neurological:  ?   Mental Status: She is alert and oriented to person, place, and time.  ?Skin: ?   General: Skin is warm and dry.  ?Psychiatric:     ?   Behavior: Behavior normal.     ?   Thought Content: Thought content normal.     ?   Judgment: Judgment normal.  ?Vitals reviewed.  ? ? ?Assessment: 26 y.o. G1P1001 presenting for 6 week postpartum visit ? ?Plan: ?Problem List Items Addressed This Visit   ?None ?Visit Diagnoses   ? ? Postpartum care and examination    -  Primary  ? Encounter for other contraceptive management      ? Relevant Medications  ? Lactic Ac-Citric Ac-Pot Bitart (PHEXXI) 1.8-1-0.4 % GEL  ? ?  ? ? ?1) Contraception-  reviewed non hormonal options including Paraguard and phexxi. Sent rx for Phexxi.  ? ?2)  Pap: up to date ? ?3)  Patient underwent screening for postpartum depression with no concerns noted. ? ?- Follow up as needed  ? ?Adelene Idler MD, FACOG ?[facility], [facility] ?10/25/2021 ?3:46 PM ? ?

## 2021-10-27 ENCOUNTER — Other Ambulatory Visit: Payer: Self-pay | Admitting: Family Medicine

## 2021-10-29 NOTE — Telephone Encounter (Signed)
Requested Prescriptions  ?Pending Prescriptions Disp Refills  ?? citalopram (CELEXA) 20 MG tablet [Pharmacy Med Name: CITALOPRAM HBR 20 MG TABLET] 90 tablet 0  ?  Sig: TAKE 1 TABLET BY MOUTH EVERY DAY  ?  ? Psychiatry:  Antidepressants - SSRI Failed - 10/27/2021 10:27 AM  ?  ?  Failed - Valid encounter within last 6 months  ?  Recent Outpatient Visits   ?      ? 8 months ago Encounter for annual health examination  ? [facility] [place], Marzella Schlein, MD  ? 1 year ago GAD (generalized anxiety disorder)  ? [facility] [place], Marzella Schlein, MD  ? 1 year ago GAD (generalized anxiety disorder)  ? [facility] [place], Marzella Schlein, MD  ? 1 year ago Chronic constipation  ? [facility] Bacigalupo, Marzella Schlein, MD  ?  ?  ?Future Appointments   ?        ? In 4 months Bacigalupo, Marzella Schlein, MD [facility]  ?  ? ?  ?  ?  ? ? ?

## 2021-11-18 ENCOUNTER — Other Ambulatory Visit: Payer: Self-pay | Admitting: Family Medicine

## 2021-11-19 MED ORDER — CITALOPRAM HYDROBROMIDE 20 MG PO TABS: 20.0000 mg | ORAL_TABLET | Freq: Every day | ORAL | 0 refills | Status: DC

## 2021-12-24 ENCOUNTER — Encounter: Payer: Self-pay | Admitting: Family Medicine

## 2021-12-25 MED ORDER — CLOBETASOL PROPIONATE 0.05 % EX FOAM: Freq: Two times a day (BID) | CUTANEOUS | 5 refills | Status: AC

## 2021-12-30 ENCOUNTER — Other Ambulatory Visit: Payer: Self-pay | Admitting: Family Medicine

## 2021-12-31 MED ORDER — CITALOPRAM HYDROBROMIDE 20 MG PO TABS: 20.0000 mg | ORAL_TABLET | Freq: Every day | ORAL | 0 refills | Status: DC

## 2022-01-04 ENCOUNTER — Ambulatory Visit: Payer: PRIVATE HEALTH INSURANCE | Admitting: Internal Medicine

## 2022-02-16 ENCOUNTER — Other Ambulatory Visit: Payer: Self-pay | Admitting: Family Medicine

## 2022-03-04 ENCOUNTER — Encounter: Payer: Self-pay | Admitting: Family Medicine

## 2022-04-04 ENCOUNTER — Encounter: Payer: Self-pay | Admitting: Internal Medicine

## 2022-04-04 ENCOUNTER — Ambulatory Visit (INDEPENDENT_AMBULATORY_CARE_PROVIDER_SITE_OTHER): Payer: BC Managed Care – PPO | Admitting: Internal Medicine

## 2022-04-04 VITALS — BP 140/86 | HR 76 | Ht 69.0 in | Wt 206.0 lb

## 2022-04-04 DIAGNOSIS — E559 Vitamin D deficiency, unspecified: Secondary | ICD-10-CM

## 2022-04-04 DIAGNOSIS — E890 Postprocedural hypothyroidism: Secondary | ICD-10-CM | POA: Diagnosis not present

## 2022-04-04 DIAGNOSIS — Z8639 Personal history of other endocrine, nutritional and metabolic disease: Secondary | ICD-10-CM

## 2022-04-04 DIAGNOSIS — E538 Deficiency of other specified B group vitamins: Secondary | ICD-10-CM | POA: Diagnosis not present

## 2022-04-04 NOTE — Patient Instructions (Signed)
Please continue Levothyroxine 112 mcg daily.  Take the thyroid hormone every day, with water, at least 30 minutes before breakfast, separated by at least 4 hours from: - acid reflux medications - calcium - iron - multivitamins  Please stop at the lab.  Please come back for a follow-up appointment in 1 year.  

## 2022-04-04 NOTE — Progress Notes (Signed)
Patient ID: Debra [name], female   DOB: 05-28-1996, 26 y.o.   MRN: [id number]  HPI  Debra [name] is a 26 y.o.-year-old female, returning for follow up for h/o uncontrolled Graves ds, now with postsurgical hypothyroidism. Last visit 9 months ago.  Interim history: Since last visit, she gave birth to a healthy baby boy 09/07/2021. She is here with him and also with her mother. She is still breast-feeding and plans to do so until he is 27-year-old.  She is on prenatal vitamins.  She is more fatigued lately as he is teething and waking up more at night.  Reviewed history: She started to have mood swings and anxiety episodes in 2014.  She then developed heat intolerance, tremors, palpitations, fatigue.  Thyroid US, [facility] (12/30/14) - normal, except heterogeneity: Right thyroid lobe Measurements: 4.0 x 1.3 x 1.3 cm. No nodules visualized. Mildly heterogeneous tissue.   Left thyroid lobe Measurements: 4.3 x 1.0 x 1.3 cm. No nodules visualized. Mildly heterogeneous tissue.   Isthmus Thickness: 3 mm.  No nodules visualized.   Lymphadenopathy: None visualized.   IMPRESSION: No evidence of thyroid nodule. Mildly heterogeneous glandular tissue. Normal size.  Thyroid Uptake and scan (01/13/2015): The thyroid scan demonstrates fairly symmetric and homogeneous uptake in the thyroid gland. No hot or cold nodules are demonstrated.   4 hour I 131 uptake = 51.2% (normal 5-20%)   24 hour I 131 uptake = 55.8% (normal 10-30%)   IMPRESSION: Elevated 4 and 24 hr iodine 123 uptake consistent with hyperthyroidism/Graves disease.  She started MMI 10 mg in am in 12/2014 >> we decreased the dose to 5 mg 2x a day, as her TFTs were improving in 02/2015, however, in 04/2015, her TFTs were still abnormal >> increased MMI to 10 mg 2x a day >>  20-10-20 mg >> 20 mg 3x a day in 12/2015.  Despite this, her TFTs remained abnormal.   She repeatedly refused RAI treatment and conventional thyroidectomy.  I  then suggested transaxillary thyroidectomy, which she had at [facility] 07/29/2016 (Dr. Lovie Macadamia) >> she developed postop hypothyroidism.  Pt is on levothyroxine 112 mcg daily, previously 9 tablets a week (while pregnant), taken: - in am - fasting - + b'fast later - no Ca, Fe, PPIs - + prenatal vitamins at night - not on Biotin  Reviewed patient's TFTs normal: Lab Results  Component Value Date   TSH 2.910 07/02/2021   TSH 2.010 04/24/2021   TSH 1.780 02/27/2021   TSH 0.516 01/31/2021   TSH 1.21 12/12/2020   TSH 1.660 05/30/2020   TSH 1.33 12/14/2019   TSH 0.52 12/15/2018   TSH 1.83 09/18/2017   TSH 3.37 03/17/2017   FREET4 1.06 07/02/2021   FREET4 1.25 02/27/2021   FREET4 1.60 01/31/2021   FREET4 1.2 12/12/2020   FREET4 1.3 12/14/2019   FREET4 1.5 12/15/2018   FREET4 1.13 09/18/2017   FREET4 1.18 03/17/2017   FREET4 2.03 (H) 12/19/2015   FREET4 1.39 10/25/2015   TSI's: Component     Latest Ref Rng & Units 07/02/2021  Thyroid Stim Immunoglobulin     0.00 - 0.55 IU/L 2.17 (H)   Lab Results  Component Value Date   TSI >700 (H) 03/17/2017   TSI 588 (H) 01/10/2015  07/22/2019: TSH 0.712 11/06/2016: TRAb 9.35 (0-1.75), TSH 20.15 01/10/2015: TSI 588 12/08/14: TSH 0.006, TT4 11.5, T3U 33%,FTI 3.8 TgAB<1 Remainder panel was normal for E2, Progesterone, PRL, testosterone.  Pt denies: - feeling nodules in neck - hoarseness - dysphagia -  choking  Pt does have a FH of thyroid ds. - hyper- or hypo-thyroidism in GGM. No FH of thyroid cancer. No h/o radiation tx to head or neck. No herbal supplements. No Biotin use. No recent steroids use.   Vitamin D level was slightly low: Lab Results  Component Value Date   VD25OH 25 (L) 07/04/2021   VD25OH 29 (L) 12/12/2020   VD25OH 25 (L) 12/14/2019  At last visit I advised her to add 2000 units vitamin D daily to prenatal vitamins.  She did so for a while, but now taking just the MVI.  She also had a slightly low vitamin B12: Lab  Results  Component Value Date   VITAMINB12 240 07/04/2021   VITAMINB12 373 12/12/2020   VITAMINB12 283 12/14/2019  Last visit, I advised her to add 1000 mcg vitamin B12 daily to prenatal vitamins.   She did so for a while, but now taking just the MVI.  She got married in 2019.   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 She had anxiety related to teaching.   ROS: + see HPI  I reviewed pt's medications, allergies, PMH, social hx, family hx, and changes were documented in the history of present illness. Otherwise, unchanged from my initial visit note.  Past Medical History:  Diagnosis Date   Allergy    Anxiety    Depression    History of irregular menstrual cycles    Irregular menses    Thyroid disease    Past Surgical History:  Procedure Laterality Date   TONSILLECTOMY AND ADENOIDECTOMY  2012   TOTAL THYROIDECTOMY     History   Social History   Marital Status: Single    Spouse Name: N/A   Number of Children: 0   Occupational History   student   Social History Main Topics   Smoking status: Never Smoker    Smokeless tobacco: Never Used   Alcohol Use: Yes   Drug Use: No   Current Outpatient Medications on File Prior to Visit  Medication Sig Dispense Refill   citalopram (CELEXA) 20 MG tablet TAKE 1 TABLET BY MOUTH EVERY DAY 30 tablet 0   clobetasol (OLUX) 0.05 % topical foam Apply topically 2 (two) times daily. 100 g 5   ibuprofen (ADVIL) 600 MG tablet Take 1 tablet (600 mg total) by mouth every 6 (six) hours as needed. (Patient not taking: Reported on 10/25/2021) 60 tablet 0   Lactic Ac-Citric Ac-Pot Bitart (PHEXXI) 1.8-1-0.4 % GEL Place 1 each vaginally as needed (Up to 1 hour before intercourse). 60 g 11   levothyroxine (SYNTHROID) 112 MCG tablet TAKE 1 TABLET BY MOUTH DAILY BEFORE BREAKFAST. 90 tablet 1   ondansetron (ZOFRAN ODT) 4 MG disintegrating tablet Take 1 tablet (4 mg total) by mouth every 6 (six) hours as needed for nausea. (Patient  not taking: Reported on 09/19/2021) 40 tablet 2   Prenatal Vit-Fe Fumarate-FA (PRENATAL MULTIVITAMIN) TABS tablet Take 1 tablet by mouth daily at 12 noon.     No current facility-administered medications on file prior to visit.   Allergies  Allergen Reactions   Sulfa Antibiotics Rash   Family History  Problem Relation Age of Onset   Hypertension Father    Gout Father    Hypertension Maternal Grandfather    Stroke Maternal Grandfather    Arthritis Paternal Grandmother    Gout Paternal Grandmother    Hypertension Paternal Grandmother    Melanoma Paternal Grandmother    Intellectual disability  Paternal Grandfather    Heart Problems Paternal Grandfather        CABG x4   Thyroid disease Other    PE: BP (!) 140/86 (BP Location: Left Arm, Patient Position: Sitting, Cuff Size: Normal)   Pulse 76   Ht 5\' 9"  (1.753 m)   Wt 206 lb (93.4 kg)   SpO2 97%   BMI 30.42 kg/m   Wt Readings from Last 3 Encounters:  04/04/22 206 lb (93.4 kg)  10/25/21 196 lb (88.9 kg)  09/19/21 202 lb (91.6 kg)   Constitutional: overweight, in NAD Eyes: EOMI, no exophthalmos ENT: moist mucous membranes, no neck masses palpated, no cervical lymphadenopathy Cardiovascular: RRR, No MRG Respiratory: CTA B Musculoskeletal: no deformities Skin: moist, warm, no rashes Neurological: + tremor with outstretched hands (chronic)  ASSESSMENT: 1. H/o Graves ds  2. Postop hypothyroidism  3.  Vitamin D insufficiency  4.  Low vitamin B12  PLAN:  1. Patient with history of severe Graves' disease, previously not responding well to methimazole, now status post transaxillary thyroidectomy by Dr. 11/17/21 at [facility] -We reviewed together her latest TSI level obtained by OB/GYN 2 days prior to our last appointment: Elevated -No signs of active Graves' ophthalmopathy: No double vision, blurry vision, eye pain, chemosis -At this point, I do not feel it is mandatory to recheck her TSI's  2. Postop hypothyroidism - latest  thyroid labs reviewed with pt. >> normal: Lab Results  Component Value Date   TSH 2.910 07/02/2021  - she continues on LT4 112 mcg daily (she was taking 2 extra tablets a week during pregnancy) - pt feels good on this dose but recently she has tired, possibly due to baby waking up more frequently at night.  However, we discussed that we needed to check her thyroid test approximately 1 month after she gave birth to make sure the dose is still correct, but she forgot about this. - we discussed about taking the thyroid hormone every day, with water, >30 minutes before breakfast, separated by >4 hours from acid reflux medications, calcium, iron, multivitamins. Pt. is taking it correctly. - will check thyroid tests today: TSH and fT4 - If labs are abnormal, she will need to return for repeat TFTs in 1.5 months  3.  Vitamin D insufficiency -In the past vitamin D level was slightly low, at 25 -We started vitamin D 2000 units daily but afterwards she was only taking multivitamins -At last visit vitamin D was low, at 25, so I again advised her to add 2000 units vitamin D daily to her prenatal vitamins >> she did so for a while, but she is now only on multivitamins. -We will recheck the level today  4.  Low vitamin B12 -We checked her vitamin B12 at last visit in the same context (please see problem #3) -I initially advised her to start 1000 mcg B12 daily but afterwards she was only taking multivitamins -At the last visit, vitamin B12 was still low-normal, at 240, so I again advised her to add 1000 mcg daily of B12 to her prenatal vitamins >> she did so for a while but she is now only on the multivitamins -We will recheck the level today  Needs refills.  Component     Latest Ref Rng 04/04/2022  TSH     mIU/L 8.47 (H)   T4,Free(Direct)     0.8 - 1.8 ng/dL 0.9   Vitamin 04/06/2022     V40 - 1,100 pg/mL 338   Vitamin D,  25-Hydroxy     30 - 100 ng/mL 30     TSH is elevated.  We will increase the dose of  levothyroxine to 125 mcg daily and recheck her test in 1.5 months. Vitamin D and B12 are normal.  I would still recommend to add an extra 1000 units of vitamin D to her multivitamins.  Debra Pavlov, MD PhD [facility] Endocrinology

## 2022-04-05 ENCOUNTER — Encounter: Payer: Self-pay | Admitting: Internal Medicine

## 2022-04-05 LAB — T4, FREE: Free T4: 0.9 ng/dL (ref 0.8–1.8)

## 2022-04-05 LAB — TSH: TSH: 8.47 mIU/L — ABNORMAL HIGH

## 2022-04-05 LAB — VITAMIN D 25 HYDROXY (VIT D DEFICIENCY, FRACTURES): Vit D, 25-Hydroxy: 30 ng/mL (ref 30–100)

## 2022-04-05 LAB — VITAMIN B12: Vitamin B-12: 338 pg/mL (ref 200–1100)

## 2022-04-05 MED ORDER — LEVOTHYROXINE SODIUM 125 MCG PO TABS: ORAL_TABLET | ORAL | 5 refills | Status: DC

## 2022-08-09 ENCOUNTER — Other Ambulatory Visit (INDEPENDENT_AMBULATORY_CARE_PROVIDER_SITE_OTHER): Payer: BC Managed Care – PPO

## 2022-08-09 DIAGNOSIS — E890 Postprocedural hypothyroidism: Secondary | ICD-10-CM

## 2022-08-09 NOTE — Addendum Note (Signed)
Addended by: Bernerd Pho I on: 08/09/2022 10:37 AM   Modules accepted: Orders

## 2022-08-10 LAB — TSH: TSH: 28.71 mIU/L — ABNORMAL HIGH

## 2022-08-10 LAB — T4, FREE: Free T4: 1 ng/dL (ref 0.8–1.8)

## 2022-08-13 ENCOUNTER — Encounter: Payer: Self-pay | Admitting: Internal Medicine

## 2022-08-16 ENCOUNTER — Other Ambulatory Visit: Payer: Self-pay | Admitting: Internal Medicine

## 2022-08-16 DIAGNOSIS — E890 Postprocedural hypothyroidism: Secondary | ICD-10-CM

## 2023-01-10 ENCOUNTER — Other Ambulatory Visit: Payer: Self-pay | Admitting: Internal Medicine

## 2023-01-10 DIAGNOSIS — E890 Postprocedural hypothyroidism: Secondary | ICD-10-CM

## 2023-02-14 ENCOUNTER — Encounter: Payer: Self-pay | Admitting: Internal Medicine

## 2023-02-17 ENCOUNTER — Other Ambulatory Visit (INDEPENDENT_AMBULATORY_CARE_PROVIDER_SITE_OTHER): Payer: BC Managed Care – PPO

## 2023-02-17 DIAGNOSIS — E890 Postprocedural hypothyroidism: Secondary | ICD-10-CM | POA: Diagnosis not present

## 2023-02-17 LAB — TSH: TSH: 0.01 u[IU]/mL — ABNORMAL LOW (ref 0.35–5.50)

## 2023-02-17 LAB — T4, FREE: Free T4: 1.38 ng/dL (ref 0.60–1.60)

## 2023-02-19 ENCOUNTER — Other Ambulatory Visit: Payer: BC Managed Care – PPO

## 2023-02-21 ENCOUNTER — Other Ambulatory Visit: Payer: Self-pay | Admitting: Internal Medicine

## 2023-02-21 DIAGNOSIS — E890 Postprocedural hypothyroidism: Secondary | ICD-10-CM

## 2023-02-21 MED ORDER — LEVOTHYROXINE SODIUM 112 MCG PO TABS: ORAL_TABLET | ORAL | 3 refills | Status: DC

## 2023-04-03 ENCOUNTER — Ambulatory Visit (INDEPENDENT_AMBULATORY_CARE_PROVIDER_SITE_OTHER): Payer: BC Managed Care – PPO | Admitting: Internal Medicine

## 2023-04-03 ENCOUNTER — Encounter: Payer: Self-pay | Admitting: Internal Medicine

## 2023-04-03 VITALS — BP 110/68 | HR 88 | Ht 69.0 in | Wt 200.2 lb

## 2023-04-03 DIAGNOSIS — E890 Postprocedural hypothyroidism: Secondary | ICD-10-CM

## 2023-04-03 DIAGNOSIS — Z8639 Personal history of other endocrine, nutritional and metabolic disease: Secondary | ICD-10-CM | POA: Diagnosis not present

## 2023-04-03 DIAGNOSIS — E538 Deficiency of other specified B group vitamins: Secondary | ICD-10-CM | POA: Diagnosis not present

## 2023-04-03 DIAGNOSIS — E559 Vitamin D deficiency, unspecified: Secondary | ICD-10-CM | POA: Diagnosis not present

## 2023-04-03 NOTE — Patient Instructions (Addendum)
Please continue Levothyroxine 112 mcg daily.  Take the thyroid hormone every day, with water, at least 30 minutes before breakfast, separated by at least 4 hours from: - acid reflux medications - calcium - iron - multivitamins  Please stop at the lab.  Please come back for a follow-up appointment in 6 months. 

## 2023-04-03 NOTE — Progress Notes (Signed)
Patient ID: Debra [name], female   DOB: 03-08-1996, 27 y.o.   MRN: [id number]  HPI  Debra [name] is a 27 y.o.-year-old female, returning for follow up for h/o uncontrolled Graves ds, now with postsurgical hypothyroidism. Last visit 1 year ago  Interim history: She gave birth to a healthy baby boy 09/07/2021. She is trying for another baby. Her menstrual cycle is late by 4 days. She feels tired  - baby teething, not sleeping well. She is also urinating more, has sensitivity to smell. She was dx'ed with scalp psoriasis >> started Clobetasol foam.  Reviewed history: She started to have mood swings and anxiety episodes in 2014.  She then developed heat intolerance, tremors, palpitations, fatigue.  Thyroid US, [facility] (12/30/14) - normal, except heterogeneity: Right thyroid lobe Measurements: 4.0 x 1.3 x 1.3 cm. No nodules visualized. Mildly heterogeneous tissue.   Left thyroid lobe Measurements: 4.3 x 1.0 x 1.3 cm. No nodules visualized. Mildly heterogeneous tissue.   Isthmus Thickness: 3 mm.  No nodules visualized.   Lymphadenopathy: None visualized.   IMPRESSION: No evidence of thyroid nodule. Mildly heterogeneous glandular tissue. Normal size.  Thyroid Uptake and scan (01/13/2015): The thyroid scan demonstrates fairly symmetric and homogeneous uptake in the thyroid gland. No hot or cold nodules are demonstrated.   4 hour I 131 uptake = 51.2% (normal 5-20%)   24 hour I 131 uptake = 55.8% (normal 10-30%)   IMPRESSION: Elevated 4 and 24 hr iodine 123 uptake consistent with hyperthyroidism/Graves disease.  She started MMI 10 mg in am in 12/2014 >> we decreased the dose to 5 mg 2x a day, as her TFTs were improving in 02/2015, however, in 04/2015, her TFTs were still abnormal >> increased MMI to 10 mg 2x a day >>  20-10-20 mg >> 20 mg 3x a day in 12/2015.  Despite this, her TFTs remained abnormal.   She repeatedly refused RAI treatment and conventional thyroidectomy.  I then  suggested transaxillary thyroidectomy, which she had at [facility] 07/29/2016 (Dr. Lovie Macadamia) >> she developed postop hypothyroidism.  Pt is on levothyroxine 112 mcg daily: - in am - fasting - + b'fast later - no Ca, Fe, PPIs - + prenatal vitamins at night - not on Biotin  Reviewed patient's TFTs normal: Lab Results  Component Value Date   TSH 0.01 Repeated and verified X2. (L) 02/17/2023   TSH 28.71 (H) 08/09/2022   TSH 8.47 (H) 04/04/2022   TSH 2.910 07/02/2021   TSH 2.010 04/24/2021   TSH 1.780 02/27/2021   TSH 0.516 01/31/2021   TSH 1.21 12/12/2020   TSH 1.660 05/30/2020   TSH 1.33 12/14/2019   FREET4 1.38 02/17/2023   FREET4 1.0 08/09/2022   FREET4 0.9 04/04/2022   FREET4 1.06 07/02/2021   FREET4 1.25 02/27/2021   FREET4 1.60 01/31/2021   FREET4 1.2 12/12/2020   FREET4 1.3 12/14/2019   FREET4 1.5 12/15/2018   FREET4 1.13 09/18/2017   TSI's: Component     Latest Ref Rng & Units 07/02/2021  Thyroid Stim Immunoglobulin     0.00 - 0.55 IU/L 2.17 (H)   Lab Results  Component Value Date   TSI >700 (H) 03/17/2017   TSI 588 (H) 01/10/2015  07/22/2019: TSH 0.712 11/06/2016: TRAb 9.35 (0-1.75), TSH 20.15 01/10/2015: TSI 588 12/08/14: TSH 0.006, TT4 11.5, T3U 33%,FTI 3.8 TgAB<1 Remainder panel was normal for E2, Progesterone, PRL, testosterone.  Pt denies: - feeling nodules in neck - hoarseness - dysphagia - choking  Pt does have a  FH of thyroid ds. - hyper- or hypo-thyroidism in GGM. No FH of thyroid cancer. No h/o radiation tx to head or neck. No herbal supplements. No Biotin use. No recent steroids use.   Vitamin D level was slightly low: Lab Results  Component Value Date   VD25OH 30 04/04/2022   VD25OH 25 (L) 07/04/2021   VD25OH 29 (L) 12/12/2020   VD25OH 25 (L) 12/14/2019  At last visit I advised her to add 2000 units vitamin D daily to prenatal vitamins.  She did so for a while, but then taking just the MVI.  She also had a slightly low vitamin B12: Lab  Results  Component Value Date   VITAMINB12 338 04/04/2022   VITAMINB12 240 07/04/2021   VITAMINB12 373 12/12/2020   VITAMINB12 283 12/14/2019  Last visit, I advised her to add 1000 mcg vitamin B12 daily to prenatal vitamins.   She did so for a while, but then taking just the MVI.  She got married in 2019.   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 She had anxiety related to teaching.   ROS: + see HPI  I reviewed pt's medications, allergies, PMH, social hx, family hx, and changes were documented in the history of present illness. Otherwise, unchanged from my initial visit note.  Past Medical History:  Diagnosis Date   Allergy    Anxiety    Depression    History of irregular menstrual cycles    Irregular menses    Thyroid disease    Past Surgical History:  Procedure Laterality Date   TONSILLECTOMY AND ADENOIDECTOMY  2012   TOTAL THYROIDECTOMY     History   Social History   Marital Status: Single    Spouse Name: N/A   Number of Children: 0   Occupational History   student   Social History Main Topics   Smoking status: Never Smoker    Smokeless tobacco: Never Used   Alcohol Use: Yes   Drug Use: No   Current Outpatient Medications on File Prior to Visit  Medication Sig Dispense Refill   citalopram (CELEXA) 20 MG tablet TAKE 1 TABLET BY MOUTH EVERY DAY 30 tablet 0   clobetasol (OLUX) 0.05 % topical foam Apply topically 2 (two) times daily. 100 g 5   Lactic Ac-Citric Ac-Pot Bitart (PHEXXI) 1.8-1-0.4 % GEL Place 1 each vaginally as needed (Up to 1 hour before intercourse). 60 g 11   levothyroxine (SYNTHROID) 112 MCG tablet TAKE 1 TABLET BY MOUTH EVERY DAY BEFORE BREAKFAST 45 tablet 3   Prenatal Vit-Fe Fumarate-FA (PRENATAL MULTIVITAMIN) TABS tablet Take 1 tablet by mouth daily at 12 noon.     No current facility-administered medications on file prior to visit.   Allergies  Allergen Reactions   Sulfa Antibiotics Rash   Family History   Problem Relation Age of Onset   Hypertension Father    Gout Father    Hypertension Maternal Grandfather    Stroke Maternal Grandfather    Arthritis Paternal Grandmother    Gout Paternal Grandmother    Hypertension Paternal Grandmother    Melanoma Paternal Grandmother    Intellectual disability Paternal Grandfather    Heart Problems Paternal Grandfather        CABG x4   Thyroid disease Other    PE: BP 110/68   Pulse 88   Ht 5\' 9"  (1.753 m)   Wt 200 lb 3.2 oz (90.8 kg)   SpO2 99%   BMI 29.56 kg/m  Wt Readings from Last 3 Encounters:  04/03/23 200 lb 3.2 oz (90.8 kg)  04/04/22 206 lb (93.4 kg)  10/25/21 196 lb (88.9 kg)   Constitutional: overweight, in NAD Eyes: EOMI, no exophthalmos ENT: no neck masses palpated, no cervical lymphadenopathy Cardiovascular: RRR, No MRG Respiratory: CTA B Musculoskeletal: no deformities Skin: no rashes Neurological: + tremor with outstretched hands (chronic)  ASSESSMENT: 1. H/o Graves ds  2. Postop hypothyroidism  3.  Vitamin D insufficiency  4.  Low vitamin B12  PLAN:  1. Patient with history of severe Graves' disease, previously not responding well to methimazole, now status post transaxillary thyroidectomy by Dr. Lovie Macadamia at [facility] levels were still elevated in 2022; we will not repeat that today -No signs of active Graves' ophthalmopathy: No double vision or vision, eye pain, chemosis -She was recently diagnosed with psoriasis and we discussed that this is not related to Graves' disease but they are both autoimmune diseases  2. Postop hypothyroidism - latest thyroid labs reviewed with pt. >> TSH was suppressed after we increased her levothyroxine dose from 112 mcg to 125 mcg daily when the TSH returned very high, at 28 (unexplained): Lab Results  Component Value Date   TSH 0.01 Repeated and verified X2. (L) 02/17/2023  - she continues on LT4 112 mcg daily - pt feels good on this dose. - we discussed about taking the thyroid  hormone every day, with water, >30 minutes before breakfast, separated by >4 hours from acid reflux medications, calcium, iron, multivitamins. Pt. is taking it correctly. - will check thyroid tests today: TSH and fT4; at today's visit, we will also check a urine pregnancy test - If labs are abnormal, she will need to return for repeat TFTs in 1.5 months  3.  Vitamin D insufficiency -I previously recommended 2000 units vitamin D daily but she is only taking a MVI -At last visit, vitamin D level was normal -Further management per PCP  4.  Low vitamin B12 -I initially advised her to start 1000 mcg B12 daily but then she switched to only multivitamins. -At last visit, vitamin B12 was low in the normal range, at 338, and I again recommended to add 1000 mcg to her multivitamin -Further management per PCP  Needs refills.  Labs need to go through [facility]. Component     Latest Ref Rng 04/03/2023  T4,Free(Direct)     0.8 - 1.8 ng/dL 1.4   TSH     mIU/L 4.78 (L)   hCG Quant     mIU/mL <1   Triiodothyronine,Free,Serum     2.3 - 4.2 pg/mL 3.3    Msg sent: Dear Lillia Abed, The pregnancy test is negative. Your TSH is slightly low.  However, it has improved significantly.  Due to this improvement, my suggestion would be to try to recheck your thyroid tests in approximately 5 weeks and adjust the levothyroxine dose at that time.  This is a very slight abnormality in the TSH and he should not cause a problem with pregnancy. Please call our main office number [phone]) to schedule a lab appointment.  Per my records, for now, you have enough refills until we recheck your tests. Sincerely, Carlus Pavlov MD  Carlus Pavlov, MD PhD [facility] Endocrinology

## 2023-04-04 ENCOUNTER — Encounter: Payer: Self-pay | Admitting: Internal Medicine

## 2023-04-04 LAB — T3, FREE: T3, Free: 3.3 pg/mL (ref 2.3–4.2)

## 2023-04-04 LAB — TSH: TSH: 0.2 m[IU]/L — ABNORMAL LOW

## 2023-04-04 LAB — BETA HCG QUANT (REF LAB): hCG Quant: <1 m[IU]/mL

## 2023-04-04 LAB — T4, FREE: Free T4: 1.4 ng/dL (ref 0.8–1.8)

## 2023-04-06 ENCOUNTER — Encounter: Payer: Self-pay | Admitting: Internal Medicine

## 2023-04-13 ENCOUNTER — Other Ambulatory Visit: Payer: Self-pay | Admitting: Internal Medicine

## 2023-04-13 DIAGNOSIS — E890 Postprocedural hypothyroidism: Secondary | ICD-10-CM

## 2023-06-10 ENCOUNTER — Encounter: Payer: Self-pay | Admitting: Internal Medicine

## 2023-06-10 DIAGNOSIS — E890 Postprocedural hypothyroidism: Secondary | ICD-10-CM

## 2023-06-11 ENCOUNTER — Other Ambulatory Visit (INDEPENDENT_AMBULATORY_CARE_PROVIDER_SITE_OTHER): Payer: BC Managed Care – PPO

## 2023-06-11 DIAGNOSIS — E890 Postprocedural hypothyroidism: Secondary | ICD-10-CM | POA: Diagnosis not present

## 2023-06-11 LAB — T4, FREE: Free T4: 1.42 ng/dL (ref 0.60–1.60)

## 2023-06-11 LAB — TSH: TSH: 0.11 u[IU]/mL — ABNORMAL LOW (ref 0.35–5.50)

## 2023-06-30 DIAGNOSIS — Z3490 Encounter for supervision of normal pregnancy, unspecified, unspecified trimester: Secondary | ICD-10-CM | POA: Insufficient documentation

## 2023-07-29 ENCOUNTER — Encounter: Payer: Self-pay | Admitting: Internal Medicine

## 2023-07-29 DIAGNOSIS — E890 Postprocedural hypothyroidism: Secondary | ICD-10-CM

## 2023-07-29 LAB — OB RESULTS CONSOLE VARICELLA ZOSTER ANTIBODY, IGG: Varicella: IMMUNE

## 2023-07-29 LAB — OB RESULTS CONSOLE RUBELLA ANTIBODY, IGM: Rubella: IMMUNE

## 2023-07-29 LAB — OB RESULTS CONSOLE HEPATITIS B SURFACE ANTIGEN: Hepatitis B Surface Ag: NEGATIVE

## 2023-08-10 MED ORDER — LEVOTHYROXINE SODIUM 112 MCG PO TABS: ORAL_TABLET | ORAL | 3 refills | Status: DC

## 2023-08-20 NOTE — L&D Delivery Note (Signed)
 Delivery Note  Debra [name] is a G2P1001 at [redacted]w[redacted]d, Patient's last menstrual period was 05/10/2023 (approximate)., not consistent with US  at [redacted]w[redacted]d. Estimated Date of Delivery: 02/17/24   First Stage: Labor onset: 1050 Induction: misoprostol  Analgesia Holli intrapartum: Epidural SROM at 1000 GBS: negative  Second Stage: Complete dilation at 1407 Onset of pushing at 1414 FHR second stage 125 bpm with moderate variability, variable decels with pushing   [name] presented to L&D for scheduled elective induction of labor at term. Misoprostol  was used for cervical ripening. She started to labor spontaneously after SROM. She progressed to C/C/+3 but did not have an urge to push due to a recent epidural. [name] was instructed on pushing techniques and pushed effectively over approximately 7 minutes for a spontaneous vaginal birth.  Delivery of a viable baby boy on 02/10/2024 at 1421 by CNM Delivery of fetal head in OA position with restitution to ROT. No nuchal cord;  Anterior then posterior shoulders delivered easily with gentle downward traction. Baby placed on mom's chest, and attended to by baby RN Cord double clamped after cessation of pulsation, cut by father of baby  Cord blood sample collection: Not Indicated A POS  Third Stage: Oxytocin  bolus started after delivery of infant for hemorrhage prophylaxis  Placenta delivered via Keren mechanism intact with 3 VC @ 1430 Placenta disposition: discarded  Uterine tone boggy / bleeding initially brisk with clots  -Methergine and TXA given for brisk uterine bleeding. Manual sweep performed and removed multiple small to moderate clots from lower uterine segment. Bleeding improved and decreased to small. Fundus remained firm at U/1.   Laceration: periurethral abrasion  Anesthesia for repair: N/A Suture: N/A Est. Blood Loss (mL): 350 ml   Complications: None  Mom to postpartum.  Baby to Couplet care / Skin to  Skin.  Newborn: Information for the patient's newborn:  Debra [name], Debra [name] [[id number]]  Live born female  Noah Birth Weight: pending  APGAR: 8, 9  Newborn Delivery   Birth date/time: 02/10/2024 14:21:00 Delivery type: Vaginal, Spontaneous      Feeding planned: breast feeding  ---------- Therisa Pillow, CNM Certified Nurse Midwife [place]

## 2023-10-07 ENCOUNTER — Ambulatory Visit: Payer: BC Managed Care – PPO | Admitting: Internal Medicine

## 2023-10-07 ENCOUNTER — Encounter: Payer: Self-pay | Admitting: Internal Medicine

## 2023-10-07 VITALS — BP 114/60 | HR 81 | Ht 69.0 in | Wt 202.4 lb

## 2023-10-07 DIAGNOSIS — E890 Postprocedural hypothyroidism: Secondary | ICD-10-CM

## 2023-10-07 DIAGNOSIS — Z8639 Personal history of other endocrine, nutritional and metabolic disease: Secondary | ICD-10-CM

## 2023-10-07 NOTE — Progress Notes (Signed)
 Patient ID: Debra [name], female   DOB: Feb 03, 1996, 28 y.o.   MRN: [id number]  HPI  Debra [name] is a 28 y.o.-year-old female, returning for follow up for h/o uncontrolled Graves ds, now with postsurgical hypothyroidism. Last visit 28 year ago  Interim history: She gave birth to a healthy baby boy 09/07/2021.  Last visit she was trying for another baby.  Pregnancy test was negative at that time. Since last visit, she got pregnant.  She is currently at 21 weeks, in the second trimester. EDD 02/17/2024: she will have another son. She has been more nausea and vomiting in the last month, occas. at night or In am.  She feels that maybe this is improving in the last week.  She also has been more cold in the last 2 weeks.  Reviewed history: She started to have mood swings and anxiety episodes in 2014.  She then developed heat intolerance, tremors, palpitations, fatigue.  Thyroid US, [facility] (12/30/14) - normal, except heterogeneity: Right thyroid lobe Measurements: 4.0 x 1.3 x 1.3 cm. No nodules visualized. Mildly heterogeneous tissue.   Left thyroid lobe Measurements: 4.3 x 1.0 x 1.3 cm. No nodules visualized. Mildly heterogeneous tissue.   Isthmus Thickness: 3 mm.  No nodules visualized.   Lymphadenopathy: None visualized.   IMPRESSION: No evidence of thyroid nodule. Mildly heterogeneous glandular tissue. Normal size.  Thyroid Uptake and scan (01/13/2015): The thyroid scan demonstrates fairly symmetric and homogeneous uptake in the thyroid gland. No hot or cold nodules are demonstrated.   4 hour I 131 uptake = 51.2% (normal 5-20%)   24 hour I 131 uptake = 55.8% (normal 10-30%)   IMPRESSION: Elevated 4 and 24 hr iodine 123 uptake consistent with hyperthyroidism/Graves disease.  She started MMI 10 mg in am in 12/2014 >> we decreased the dose to 5 mg 2x a day, as her TFTs were improving in 02/2015, however, in 04/2015, her TFTs were still abnormal >> increased MMI to 10 mg 2x a day  >>  20-10-20 mg >> 20 mg 3x a day in 12/2015.  Despite this, her TFTs remained abnormal.   She repeatedly refused RAI treatment and conventional thyroidectomy.  I then suggested transaxillary thyroidectomy, which she had at [facility] 07/29/2016 (Dr. Lovie Macadamia) >> she developed postop hypothyroidism.  Pt is on levothyroxine 112 mcg 9/7 days, dose increased after pregnancy (equivalent of 144 mcg daily): - maybe missed 1 tab - in am - fasting - + b'fast > 30 min  - no Ca, Fe, PPIs - + prenatal vitamins at night - not on Biotin  Reviewed patient's TFTs:  Lab Results  Component Value Date   TSH 0.11 (L) 06/11/2023   TSH 0.20 (L) 04/03/2023   TSH 0.01 Repeated and verified X2. (L) 02/17/2023   TSH 28.71 (H) 08/09/2022   TSH 8.47 (H) 04/04/2022   TSH 2.910 07/02/2021   TSH 2.010 04/24/2021   TSH 1.780 02/27/2021   TSH 0.516 01/31/2021   TSH 1.21 12/12/2020   FREET4 1.42 06/11/2023   FREET4 1.4 04/03/2023   FREET4 1.38 02/17/2023   FREET4 1.0 08/09/2022   FREET4 0.9 04/04/2022   FREET4 1.06 07/02/2021   FREET4 1.25 02/27/2021   FREET4 1.60 01/31/2021   FREET4 1.2 12/12/2020   FREET4 1.3 12/14/2019   TSI's: Component     Latest Ref Rng & Units 07/02/2021  Thyroid Stim Immunoglobulin     0.00 - 0.55 IU/L 2.17 (H)   Lab Results  Component Value Date   TSI >  700 (H) 03/17/2017   TSI 588 (H) 01/10/2015  07/22/2019: TSH 0.712 11/06/2016: TRAb 9.35 (0-1.75), TSH 20.15 01/10/2015: TSI 588 12/08/14: TSH 0.006, TT4 11.5, T3U 33%,FTI 3.8 TgAB<1 Remainder panel was normal for E2, Progesterone, PRL, testosterone.  Pt denies: - feeling nodules in neck - hoarseness - dysphagia - choking  Pt does have a FH of thyroid ds. - hyper- or hypo-thyroidism in GGM. No FH of thyroid cancer. No h/o radiation tx to head or neck. No herbal supplements. No Biotin use. No recent steroids use.   Vitamin D level was slightly low: Lab Results  Component Value Date   VD25OH 30 04/04/2022   VD25OH  25 (L) 07/04/2021   VD25OH 29 (L) 12/12/2020   VD25OH 25 (L) 12/14/2019  At last visit I advised her to add 2000 units vitamin D daily to prenatal vitamins.  She did so for a while, but then taking just the MVI.  She also had a slightly low vitamin B12: Lab Results  Component Value Date   VITAMINB12 338 04/04/2022   VITAMINB12 240 07/04/2021   VITAMINB12 373 12/12/2020   VITAMINB12 283 12/14/2019  Last visit, I advised her to add 1000 mcg vitamin B12 daily to prenatal vitamins.   She did so for a while, but then taking just the MVI.  She got married in 2019.   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 She had anxiety related to teaching.   ROS: + see HPI  I reviewed pt's medications, allergies, PMH, social hx, family hx, and changes were documented in the history of present illness. Otherwise, unchanged from my initial visit note.  Past Medical History:  Diagnosis Date   Allergy    Anxiety    Depression    History of irregular menstrual cycles    Irregular menses    Thyroid disease    Past Surgical History:  Procedure Laterality Date   TONSILLECTOMY AND ADENOIDECTOMY  2012   TOTAL THYROIDECTOMY     History   Social History   Marital Status: Single    Spouse Name: N/A   Number of Children: 0   Occupational History   student   Social History Main Topics   Smoking status: Never Smoker    Smokeless tobacco: Never Used   Alcohol Use: Yes   Drug Use: No   Current Outpatient Medications on File Prior to Visit  Medication Sig Dispense Refill   citalopram (CELEXA) 20 MG tablet TAKE 1 TABLET BY MOUTH EVERY DAY (Patient not taking: Reported on 04/03/2023) 30 tablet 0   clobetasol (OLUX) 0.05 % topical foam Apply topically 2 (two) times daily. 100 g 5   Lactic Ac-Citric Ac-Pot Bitart (PHEXXI) 1.8-1-0.4 % GEL Place 1 each vaginally as needed (Up to 1 hour before intercourse). (Patient not taking: Reported on 04/03/2023) 60 g 11   levothyroxine  (SYNTHROID) 112 MCG tablet TAKE 1 TABLET BY MOUTH EVERY DAY BEFORE BREAKFAST except on 2 days take 2 tablets 90 tablet 3   Prenatal Vit-Fe Fumarate-FA (PRENATAL MULTIVITAMIN) TABS tablet Take 1 tablet by mouth daily at 12 noon.     No current facility-administered medications on file prior to visit.   Allergies  Allergen Reactions   Sulfa Antibiotics Rash   Family History  Problem Relation Age of Onset   Hypertension Father    Gout Father    Hypertension Maternal Grandfather    Stroke Maternal Grandfather    Arthritis Paternal Grandmother    Gout  Paternal Grandmother    Hypertension Paternal Grandmother    Melanoma Paternal Grandmother    Intellectual disability Paternal Grandfather    Heart Problems Paternal Grandfather        CABG x4   Thyroid disease Other    PE: BP 114/60   Pulse 81   Ht 5\' 9"  (1.753 m)   Wt 202 lb 6.4 oz (91.8 kg)   SpO2 98%   BMI 29.89 kg/m   Wt Readings from Last 3 Encounters:  10/07/23 202 lb 6.4 oz (91.8 kg)  04/03/23 200 lb 3.2 oz (90.8 kg)  04/04/22 206 lb (93.4 kg)   Constitutional: overweight, in NAD Eyes: EOMI, no exophthalmos ENT: no neck masses palpated, no cervical lymphadenopathy Cardiovascular: RRR, No MRG Respiratory: CTA B Musculoskeletal: no deformities Skin: no rashes Neurological: + tremor with outstretched hands (chronic)  ASSESSMENT: 1. H/o Graves ds  2. Postop hypothyroidism  3.  Vitamin D insufficiency  4.  Low vitamin B12  PLAN:  1. Patient with history of severe Graves' disease, previously not responding well to methimazole, now status post transaxillary thyroidectomy by Dr. Lovie Macadamia at [facility] -No active signs of Graves' ophthalmopathy -Before last visit she was diagnosed with and we discussed that this is not related to Graves' disease but they are both autoimmune diseases  2. Postop hypothyroidism - latest thyroid labs reviewed with pt. >> TSH was lower than target in 05/2023, however TSH was 2.683 in 07/2023  (first trimester) -very slightly higher than target, then she had a TSH at goal last month at 2.412, but another TSH obtained yesterday was high, at 7.028. - she continues on LT4 112 mcg 9/7 days (equivalent daily dose of 144 mcg daily) - pt feels good on this dose, but she does have nausea  - we discussed about targets for TSH during pregnancy: Less than 2.5 in the first trimester and less than 3 afterwards - at today's visit we will increase her LT4 dose to add 1 extra tablet a week (equivalent daily dose of 160 mcg daily) - we discussed about taking the thyroid hormone every day, with water, >30 minutes before breakfast, separated by >4 hours from acid reflux medications, calcium, iron, multivitamins. Pt. is taking it correctly.  She feels that she only missed 1 tablet in the last month.  However, she did have nausea and vomiting in certain situations and I feel that this may be related to her higher TSH obtained yesterday.  She does feel that the nausea has improved in the last week. - I advised her to have labs checked in 4 weeks after the above change.  She prefers to have them checked at her OB/GYN's office, where she goes every 4 weeks.  I advised her to send me the results.  3.  Vitamin D insufficiency -I previously recommended to possibly vitamin D daily, however, she was not on this at last visit -Currently on prenatal vitamins -Further management per PCP  4.  Low vitamin B12 -I initially advised her to start 1000 mcg B12 daily but then she switched to only multivitamins. -Currently on prenatal vitamins -Further management per PCP  Carlus Pavlov, MD PhD [facility]

## 2023-10-07 NOTE — Patient Instructions (Addendum)
 Please increase Levothyroxine 112 mcg: - 3 days a week: 2 tab a day - 4 days a week: 1 tab a day  Take the thyroid hormone every day, with water, at least 30 minutes before breakfast, separated by at least 4 hours from: - acid reflux medications - calcium - iron - multivitamins  Please have repeat labs in 4 weeks.  Please come back for a follow-up appointment in 6 months.

## 2023-10-21 ENCOUNTER — Encounter: Payer: Self-pay | Admitting: Obstetrics and Gynecology

## 2023-10-21 ENCOUNTER — Encounter: Payer: Self-pay | Admitting: Internal Medicine

## 2023-10-21 ENCOUNTER — Other Ambulatory Visit: Payer: Self-pay

## 2023-10-21 ENCOUNTER — Observation Stay
Admission: EM | Admit: 2023-10-21 | Discharge: 2023-10-22 | Disposition: A | Discharge status: Home / Self Care | Attending: Obstetrics and Gynecology | Admitting: Obstetrics and Gynecology

## 2023-10-21 DIAGNOSIS — O99282 Endocrine, nutritional and metabolic diseases complicating pregnancy, second trimester: Secondary | ICD-10-CM | POA: Insufficient documentation

## 2023-10-21 DIAGNOSIS — O212 Late vomiting of pregnancy: Secondary | ICD-10-CM | POA: Diagnosis present

## 2023-10-21 DIAGNOSIS — E876 Hypokalemia: Secondary | ICD-10-CM | POA: Diagnosis not present

## 2023-10-21 DIAGNOSIS — O98512 Other viral diseases complicating pregnancy, second trimester: Principal | ICD-10-CM | POA: Insufficient documentation

## 2023-10-21 DIAGNOSIS — Z3A23 23 weeks gestation of pregnancy: Secondary | ICD-10-CM | POA: Insufficient documentation

## 2023-10-21 DIAGNOSIS — A084 Viral intestinal infection, unspecified: Secondary | ICD-10-CM | POA: Diagnosis not present

## 2023-10-21 DIAGNOSIS — E039 Hypothyroidism, unspecified: Secondary | ICD-10-CM | POA: Insufficient documentation

## 2023-10-21 DIAGNOSIS — R1110 Vomiting, unspecified: Principal | ICD-10-CM | POA: Diagnosis present

## 2023-10-21 LAB — COMPREHENSIVE METABOLIC PANEL
ALT: 24 U/L (ref 0–44)
AST: 26 U/L (ref 15–41)
Albumin: 3.3 g/dL — ABNORMAL LOW (ref 3.5–5.0)
Alkaline Phosphatase: 61 U/L (ref 38–126)
Anion gap: 9 (ref 5–15)
BUN: 7 mg/dL (ref 6–20)
CO2: 26 mmol/L (ref 22–32)
Calcium: 8.1 mg/dL — ABNORMAL LOW (ref 8.9–10.3)
Chloride: 99 mmol/L (ref 98–111)
Creatinine, Ser: 0.54 mg/dL (ref 0.44–1.00)
GFR, Estimated: >60 mL/min (ref >60)
Glucose, Bld: 95 mg/dL (ref 70–99)
Potassium: 2.5 mmol/L — CL (ref 3.5–5.1)
Sodium: 134 mmol/L — ABNORMAL LOW (ref 135–145)
Total Bilirubin: 0.9 mg/dL (ref 0.0–1.2)
Total Protein: 6.4 g/dL — ABNORMAL LOW (ref 6.5–8.1)

## 2023-10-21 LAB — CBC WITH DIFFERENTIAL/PLATELET
Abs Immature Granulocytes: 0.03 10*3/uL (ref 0.00–0.07)
Basophils Absolute: 0 10*3/uL (ref 0.0–0.1)
Basophils Relative: 0 %
Eosinophils Absolute: 0 10*3/uL (ref 0.0–0.5)
Eosinophils Relative: 1 %
HCT: 32.2 % — ABNORMAL LOW (ref 36.0–46.0)
Hemoglobin: 12 g/dL (ref 12.0–15.0)
Immature Granulocytes: 0 %
Lymphocytes Relative: 22 %
Lymphs Abs: 1.7 10*3/uL (ref 0.7–4.0)
MCH: 32.3 pg (ref 26.0–34.0)
MCHC: 37.3 g/dL — ABNORMAL HIGH (ref 30.0–36.0)
MCV: 86.8 fL (ref 80.0–100.0)
Monocytes Absolute: 0.5 10*3/uL (ref 0.1–1.0)
Monocytes Relative: 6 %
Neutro Abs: 5.5 10*3/uL (ref 1.7–7.7)
Neutrophils Relative %: 71 %
Platelets: 311 10*3/uL (ref 150–400)
RBC: 3.71 MIL/uL — ABNORMAL LOW (ref 3.87–5.11)
RDW: 12.9 % (ref 11.5–15.5)
WBC: 7.7 10*3/uL (ref 4.0–10.5)
nRBC: 0 % (ref 0.0–0.2)

## 2023-10-21 LAB — GLUCOSE, CAPILLARY: Glucose-Capillary: 81 mg/dL (ref 70–99)

## 2023-10-21 LAB — MAGNESIUM: Magnesium: 1.9 mg/dL (ref 1.7–2.4)

## 2023-10-21 MED ORDER — FAMOTIDINE 20 MG PO TABS: 20.0000 mg | ORAL_TABLET | Freq: Two times a day (BID) | ORAL | Status: DC

## 2023-10-21 MED ORDER — PROMETHAZINE HCL 25 MG RE SUPP: 25.0000 mg | Freq: Four times a day (QID) | RECTAL | Status: DC | PRN

## 2023-10-21 MED ORDER — FAMOTIDINE IN NACL 20-0.9 MG/50ML-% IV SOLN
20.0000 mg | Freq: Two times a day (BID) | INTRAVENOUS | Status: DC
  Administered 2023-10-21: 20 mg via INTRAVENOUS
  Filled 2023-10-21: qty 50

## 2023-10-21 MED ORDER — SODIUM CHLORIDE 0.9 % IV SOLN
25.0000 mg | Freq: Four times a day (QID) | INTRAVENOUS | Status: DC | PRN
  Administered 2023-10-21: 25 mg via INTRAVENOUS
  Filled 2023-10-21: qty 25

## 2023-10-21 MED ORDER — LACTATED RINGERS IV BOLUS
1000.0000 mL | Freq: Once | INTRAVENOUS | Status: AC
  Administered 2023-10-21: 1000 mL via INTRAVENOUS

## 2023-10-21 MED ORDER — POTASSIUM CHLORIDE 10 MEQ/100ML IV SOLN
10.0000 meq | INTRAVENOUS | Status: AC
  Administered 2023-10-21 – 2023-10-22 (×4): 10 meq via INTRAVENOUS
  Filled 2023-10-21 (×4): qty 100

## 2023-10-21 MED ORDER — THIAMINE HCL 100 MG/ML IJ SOLN
Freq: Once | INTRAVENOUS | Status: AC
  Filled 2023-10-21: qty 1000

## 2023-10-21 MED ORDER — PROMETHAZINE HCL 25 MG PO TABS: 25.0000 mg | ORAL_TABLET | Freq: Four times a day (QID) | ORAL | Status: DC | PRN

## 2023-10-21 NOTE — Discharge Summary (Signed)
 Patient ID: Debra [name] MRN: [id number] DOB/AGE: Jun 26, 1996 28 y.o.  Admit date: 10/21/2023 Discharge date: 10/22/2023  Admission Diagnoses: 28yo G2P1 at [redacted]w[redacted]d presents with N/V in pregnancy.  She was rx'd Zofran outpatient last Friday with some improvement, however today the N/V got worse. Margaretmary Eddy CNM sent her in for IVF and IV medication.  Family was sick with similar symptoms just prior to the start of her N/V  Discharge Diagnoses: Viral Gastritis  Factors complicating pregnancy: Hypothyroidism d/t thyroidectomy  Hx of velamentous cord insertion   Prenatal Procedures: none  Consults: None  Significant Diagnostic Studies:  Results for orders placed or performed during the hospital encounter of 10/21/23 (from the past week)  CBC with Differential/Platelet   Collection Time: 10/21/23  6:18 PM  Result Value Ref Range   WBC 7.7 4.0 - 10.5 K/uL   RBC 3.71 (L) 3.87 - 5.11 MIL/uL   Hemoglobin 12.0 12.0 - 15.0 g/dL   HCT 78.2 (L) 95.6 - 21.3 %   MCV 86.8 80.0 - 100.0 fL   MCH 32.3 26.0 - 34.0 pg   MCHC 37.3 (H) 30.0 - 36.0 g/dL   RDW 08.6 57.8 - 46.9 %   Platelets 311 150 - 400 K/uL   nRBC 0.0 0.0 - 0.2 %   Neutrophils Relative % 71 %   Neutro Abs 5.5 1.7 - 7.7 K/uL   Lymphocytes Relative 22 %   Lymphs Abs 1.7 0.7 - 4.0 K/uL   Monocytes Relative 6 %   Monocytes Absolute 0.5 0.1 - 1.0 K/uL   Eosinophils Relative 1 %   Eosinophils Absolute 0.0 0.0 - 0.5 K/uL   Basophils Relative 0 %   Basophils Absolute 0.0 0.0 - 0.1 K/uL   Immature Granulocytes 0 %   Abs Immature Granulocytes 0.03 0.00 - 0.07 K/uL  Comprehensive metabolic panel   Collection Time: 10/21/23  6:18 PM  Result Value Ref Range   Sodium 134 (L) 135 - 145 mmol/L   Potassium 2.5 (LL) 3.5 - 5.1 mmol/L   Chloride 99 98 - 111 mmol/L   CO2 26 22 - 32 mmol/L   Glucose, Bld 95 70 - 99 mg/dL   BUN 7 6 - 20 mg/dL   Creatinine, Ser 6.29 0.44 - 1.00 mg/dL   Calcium 8.1 (L) 8.9 - 10.3 mg/dL   Total Protein 6.4 (L)  6.5 - 8.1 g/dL   Albumin 3.3 (L) 3.5 - 5.0 g/dL   AST 26 15 - 41 U/L   ALT 24 0 - 44 U/L   Alkaline Phosphatase 61 38 - 126 U/L   Total Bilirubin 0.9 0.0 - 1.2 mg/dL   GFR, Estimated >52 >84 mL/min   Anion gap 9 5 - 15  Magnesium   Collection Time: 10/21/23  6:18 PM  Result Value Ref Range   Magnesium 1.9 1.7 - 2.4 mg/dL  TSH   Collection Time: 10/21/23  6:18 PM  Result Value Ref Range   TSH 4.993 (H) 0.350 - 4.500 uIU/mL  T4, free   Collection Time: 10/21/23  6:18 PM  Result Value Ref Range   Free T4 0.84 0.61 - 1.12 ng/dL  Glucose, capillary   Collection Time: 10/21/23 11:52 PM  Result Value Ref Range   Glucose-Capillary 81 70 - 99 mg/dL  Comprehensive metabolic panel   Collection Time: 10/22/23  2:18 AM  Result Value Ref Range   Sodium 134 (L) 135 - 145 mmol/L   Potassium 3.7 3.5 - 5.1 mmol/L   Chloride 104  98 - 111 mmol/L   CO2 22 22 - 32 mmol/L   Glucose, Bld 81 70 - 99 mg/dL   BUN <5 (L) 6 - 20 mg/dL   Creatinine, Ser 4.09 0.44 - 1.00 mg/dL   Calcium 7.6 (L) 8.9 - 10.3 mg/dL   Total Protein 5.1 (L) 6.5 - 8.1 g/dL   Albumin 2.8 (L) 3.5 - 5.0 g/dL   AST 20 15 - 41 U/L   ALT 20 0 - 44 U/L   Alkaline Phosphatase 50 38 - 126 U/L   Total Bilirubin 1.1 0.0 - 1.2 mg/dL   GFR, Estimated >81 >19 mL/min   Anion gap 8 5 - 15    Treatments: IV hydration and antiemetics  Hospital Course:  This is a 28 y.o. G2P1001 with IUP at [redacted]w[redacted]d seen for worsening N/V in pregnancy.    - N/V  -- IVF bolus given  -- Phenergan and Pepcid  -- Checked TSH and T4  - Hypokalemia   -- IV K+ replacement  K+ 2.5 -> 3.7  -- Telemetry remained reassuring through out visit  She was observed and reported improvement of symptoms, fetal heart rate monitoring remained reassuring, and she had no signs/symptoms of other maternal-fetal concerns.  She was deemed stable for discharge to home with outpatient follow up.    Current Facility-Administered Medications (Respiratory):    promethazine  (PHENERGAN) tablet 25 mg **OR** promethazine (PHENERGAN) 25 mg in sodium chloride 0.9 % 50 mL IVPB **OR** promethazine (PHENERGAN) suppository 25 mg  Current Facility-Administered Medications (Hematological):    lactated ringers 1,000 mL with thiamine 100 mg, folic acid 1 mg, M.V.I. Adult 10 mL infusion*  Current Facility-Administered Medications (Other):    famotidine (PEPCID) tablet 20 mg **OR** famotidine (PEPCID) IVPB 20 mg premix   lactated ringers 1,000 mL with thiamine 100 mg, folic acid 1 mg, M.V.I. Adult 10 mL infusion*   potassium chloride 10 mEq in 100 mL IVPB       Discharge Physical Exam:  BP (!) 97/47   Pulse 69   Temp 98.3 F (36.8 C) (Oral)   Resp 17   General: NAD CV: RRR Pulm: nl effort ABD: s/nd/nt, gravid DVT Evaluation: LE non-ttp, no evidence of DVT on exam.  Fetal Heart tones remained reassuring for GA  TOCO: quiet SVE: deferred      Discharge Condition: Stable  Disposition: Discharge disposition: 01-Home or Self Care        Allergies as of 10/22/2023       Reactions   Sulfa Antibiotics Rash        Medication List     STOP taking these medications    Phexxi 1.8-1-0.4 % Gel Generic drug: Lactic Ac-Citric Ac-Pot Bitart       TAKE these medications    citalopram 20 MG tablet Commonly known as: CELEXA TAKE 1 TABLET BY MOUTH EVERY DAY   clobetasol 0.05 % topical foam Commonly known as: OLUX Apply topically 2 (two) times daily.   levothyroxine 112 MCG tablet Commonly known as: SYNTHROID TAKE 1 TABLET BY MOUTH EVERY DAY BEFORE BREAKFAST except on 2 days take 2 tablets   prenatal multivitamin Tabs tablet Take 1 tablet by mouth daily at 12 noon.        Follow-up Information     Gustavo Lah, CNM. Schedule an appointment as soon as possible for a visit on 10/24/2023.   Specialty: Certified Nurse Midwife Contact information: [address] [place] [address] [phone]  SignedHaroldine Laws, CNM 10/22/2023 3:30 AM

## 2023-10-21 NOTE — OB Triage Note (Signed)
 Pt reports to labor and delivery with complaints of nausea and vomiting. Last Wednesday, she started having nausea and vomiting. She felt better on Friday and thought the stomach bug had resolved. On Saturday, she started having nausea and vomiting again. It resolved by end of day Sunday. Her nausea and vomiting returned this morning. She took zofran around 11am with moderate relief. Today, she has only been able to eat a few saltines and minimal water.   **On thyroid medicine - no thyroid.   Per Midwife, RN should draw CBC, CMP, and magnesium level as well as insert IV and give LR bolus. RN completed all items.

## 2023-10-22 DIAGNOSIS — O98512 Other viral diseases complicating pregnancy, second trimester: Secondary | ICD-10-CM | POA: Diagnosis not present

## 2023-10-22 LAB — COMPREHENSIVE METABOLIC PANEL
ALT: 20 U/L (ref 0–44)
AST: 20 U/L (ref 15–41)
Albumin: 2.8 g/dL — ABNORMAL LOW (ref 3.5–5.0)
Alkaline Phosphatase: 50 U/L (ref 38–126)
Anion gap: 8 (ref 5–15)
BUN: <5 mg/dL — ABNORMAL LOW (ref 6–20)
CO2: 22 mmol/L (ref 22–32)
Calcium: 7.6 mg/dL — ABNORMAL LOW (ref 8.9–10.3)
Chloride: 104 mmol/L (ref 98–111)
Creatinine, Ser: 0.44 mg/dL (ref 0.44–1.00)
GFR, Estimated: >60 mL/min (ref >60)
Glucose, Bld: 81 mg/dL (ref 70–99)
Potassium: 3.7 mmol/L (ref 3.5–5.1)
Sodium: 134 mmol/L — ABNORMAL LOW (ref 135–145)
Total Bilirubin: 1.1 mg/dL (ref 0.0–1.2)
Total Protein: 5.1 g/dL — ABNORMAL LOW (ref 6.5–8.1)

## 2023-10-22 LAB — TSH: TSH: 4.993 u[IU]/mL — ABNORMAL HIGH (ref 0.350–4.500)

## 2023-10-22 LAB — T4, FREE: Free T4: 0.84 ng/dL (ref 0.61–1.12)

## 2023-10-22 MED ORDER — POTASSIUM CHLORIDE 10 MEQ/100ML IV SOLN
10.0000 meq | Freq: Once | INTRAVENOUS | Status: DC
  Filled 2023-10-22: qty 100

## 2023-12-01 DIAGNOSIS — O280 Abnormal hematological finding on antenatal screening of mother: Secondary | ICD-10-CM | POA: Insufficient documentation

## 2023-12-01 DIAGNOSIS — R790 Abnormal level of blood mineral: Secondary | ICD-10-CM | POA: Insufficient documentation

## 2023-12-01 LAB — OB RESULTS CONSOLE HIV ANTIBODY (ROUTINE TESTING): HIV: NONREACTIVE

## 2023-12-01 LAB — OB RESULTS CONSOLE RPR: RPR: NONREACTIVE

## 2024-01-03 IMAGING — US US MFM OB FOLLOW-UP
1 series · 14 of 24 positions shown · non-contrast
Comparison: none

[Series 1: us mfm ob follow-up · 14 of 24 slices shown]
[im 1/24]
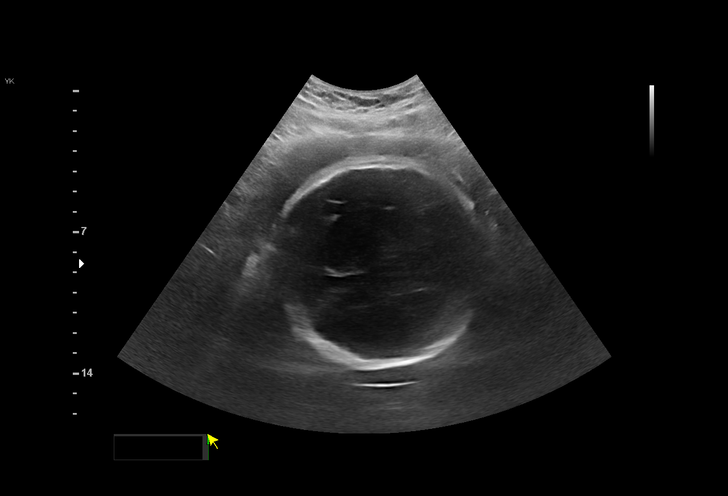
[im 3/24]
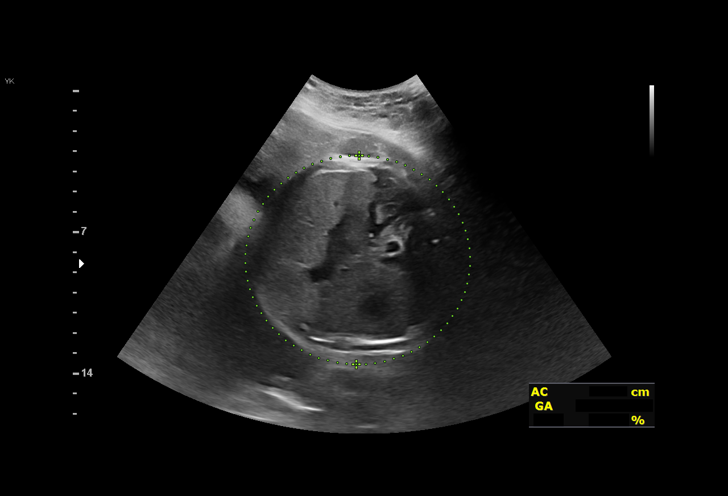
[im 5/24]
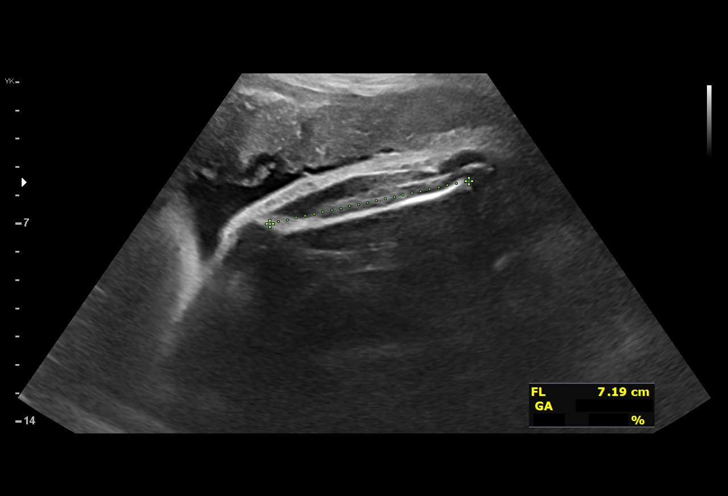
[im 7/24]
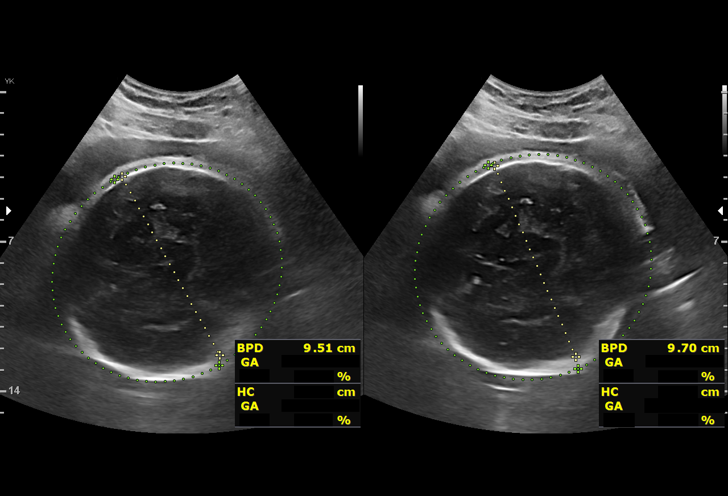
[im 8/24]
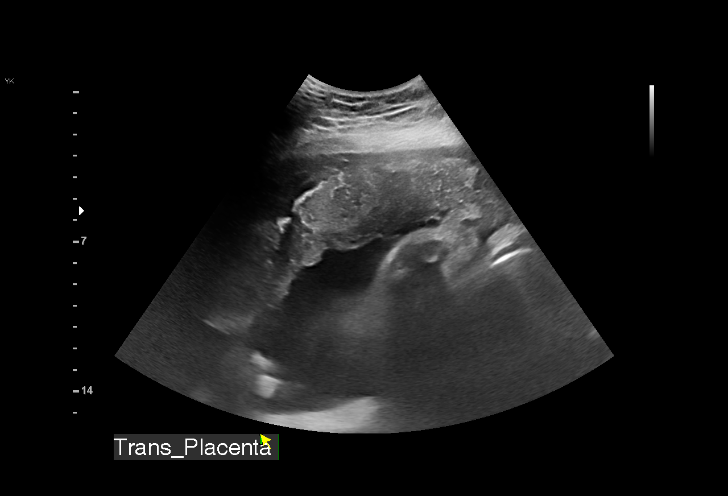
[im 10/24]
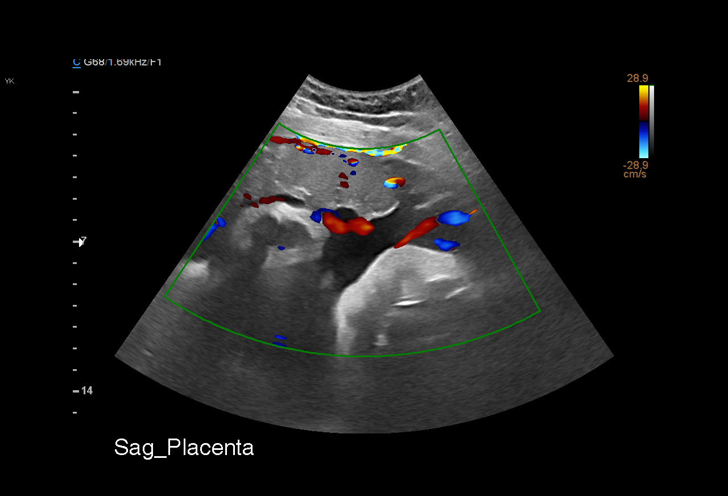
[im 12/24]
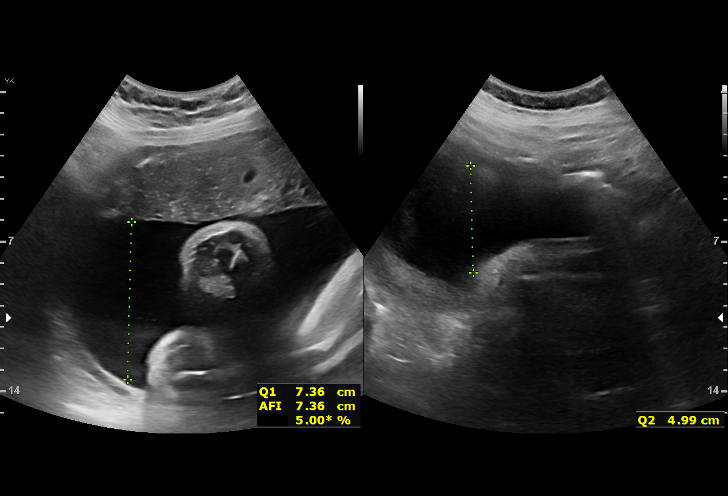
[im 13/24]
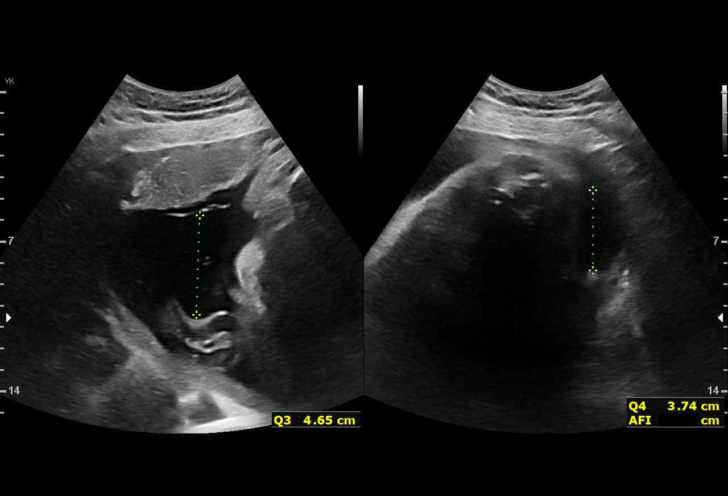
[im 15/24]
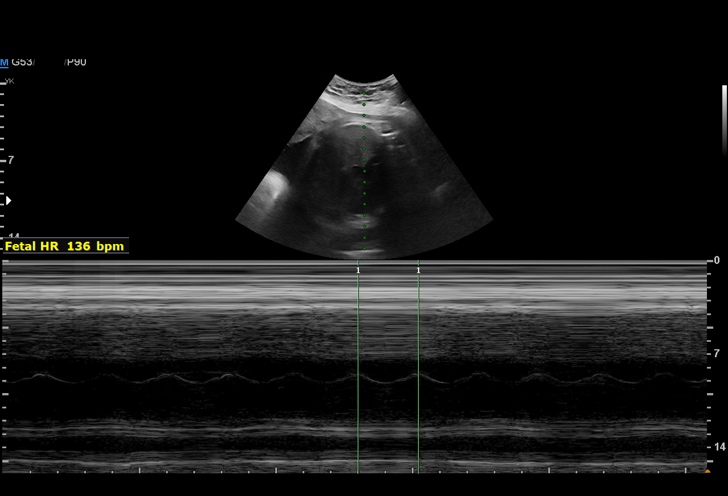
[im 17/24]
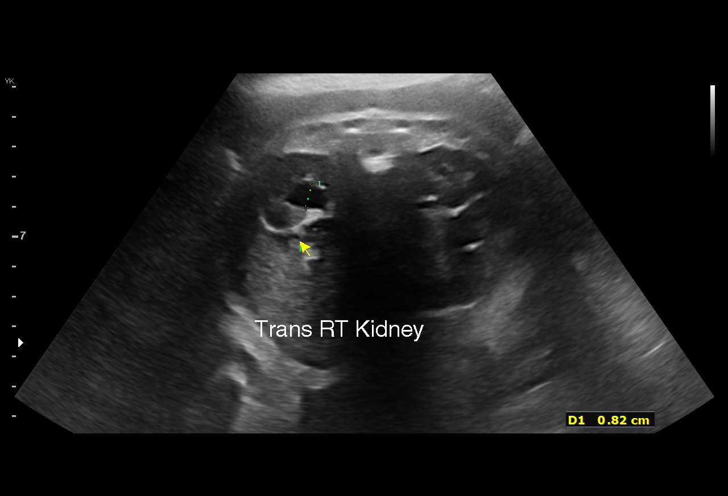
[im 19/24]
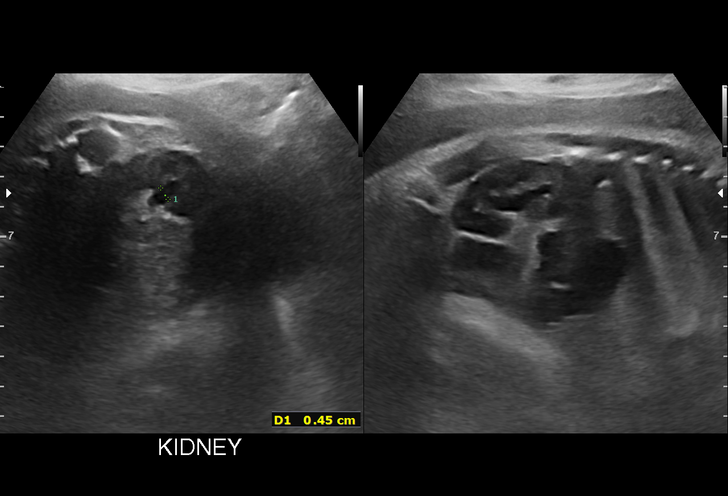
[im 20/24]
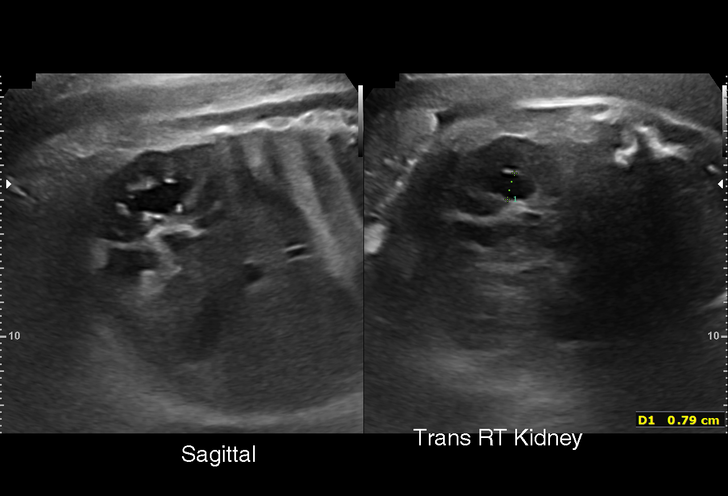
[im 22/24]
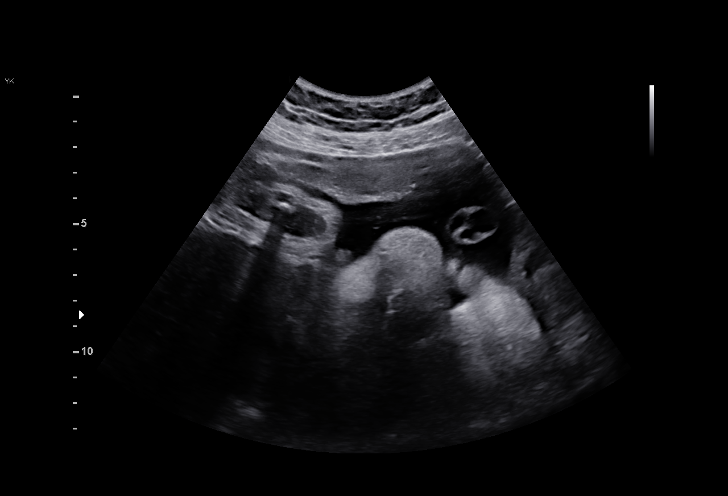
[im 24/24]
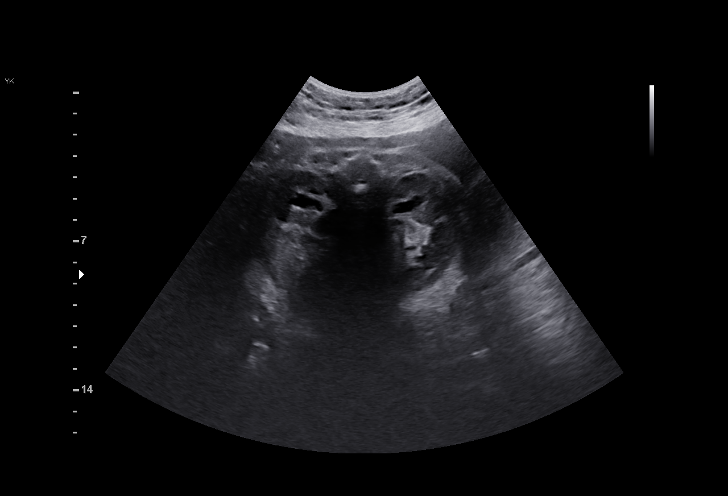

[14 of 24 positions shown; findings below may reference images not displayed]

Indications

 Hypothyroid, s/p thyroidectomy
 Obesity complicating pregnancy, second
 trimester (BMI 30)
 Marginal insertion of umbilical cord affecting
 management of mother in third trimester
 37 weeks gestation of pregnancy
 Vanishing twin
Fetal Evaluation

 Num Of Fetuses:         1
 Fetal Heart Rate(bpm):  136
 Cardiac Activity:       Observed
 Presentation:           Cephalic
 Placenta:               Anterior
 P. Cord Insertion:      Marginal insertion

 Amniotic Fluid
 AFI FV:      Within normal limits

 AFI Sum(cm)     %Tile       Largest Pocket(cm)
 20.7            81

 RUQ(cm)       RLQ(cm)       LUQ(cm)        LLQ(cm)
 7.4           3.7           5
Biometry
 BPD:        96  mm     G. Age:  39w 1d         95  %    CI:        82.32   %    70 - 86
                                                         FL/HC:      21.8   %    20.9 -
 HC:      333.8  mm     G. Age:  38w 1d         38  %    HC/AC:      1.00        0.92 -
 AC:      333.7  mm     G. Age:  37w 2d         54  %    FL/BPD:     75.7   %    71 - 87
 FL:       72.7  mm     G. Age:  37w 2d         38  %    FL/AC:      21.8   %    20 - 24

 Est. FW:    9755  gm      7 lb 3 oz     57  %
OB History

 Gravidity:    1
 Living:       0
Gestational Age

 LMP:           37w 5d        Date:  12/16/20                 EDD:   09/22/21
 U/S Today:     38w 0d                                        EDD:   09/20/21
 Best:          37w 5d     Det. By:  LMP  (12/16/20)          EDD:   09/22/21
Anatomy

 Cranium:               Previously seen        Aortic Arch:            Previously seen
 Cavum:                 Appears normal         Ductal Arch:            Previously seen
 Ventricles:            Previously seen        Diaphragm:              Appears normal
 Choroid Plexus:        Previously seen        Stomach:                Appears normal, left
                                                                       sided
 Cerebellum:            Previously seen        Abdomen:                Previously seen
 Posterior Fossa:       Previously seen        Abdominal Wall:         Previously seen
 Nuchal Fold:           Not applicable (>20    Cord Vessels:           Previously seen
                        wks GA)
 Face:                  Orbits and profile     Kidneys:                Right sided
                        previously seen
                                                                       pyelectasis,8.2
                                                                       mm
 Lips:                  Appears normal         Bladder:                Appears normal
 Thoracic:              Previously seen        Spine:                  Previously seen
 Heart:                 Previously seen        Upper Extremities:      Previously seen
 RVOT:                  Previously seen        Lower Extremities:      Previously seen
 LVOT:                  Previously seen

 Other:  Technicallly difficult due to advanced GA and maternal habitus.
Comments

 This patient was seen for a follow up growth scan due to a
 marginal placental cord insertion and hypothyroidism.  She
 denies any problems since her last exam.  She is feeling
 some irregular contractions.  Her cervix was 3 cm dilated 2
 days ago.
 She was informed that the fetal growth and amniotic fluid
 level appears appropriate for her gestational age.
 Labor precautions were reviewed today.
 No further exams were scheduled in our office.

## 2024-01-21 ENCOUNTER — Other Ambulatory Visit: Payer: Self-pay

## 2024-01-21 DIAGNOSIS — D509 Iron deficiency anemia, unspecified: Secondary | ICD-10-CM

## 2024-01-21 DIAGNOSIS — R790 Abnormal level of blood mineral: Secondary | ICD-10-CM

## 2024-01-21 NOTE — Progress Notes (Signed)
 Diagnosis: 1. Low serum ferritin level   2. Iron deficiency anemia during pregnancy     A/P -Iron transfusion with venofer 500mg  x 1 dose. May repeat in 1 week.   Fraser Jackson, CNM Certified Nurse Midwife [facility]

## 2024-01-22 ENCOUNTER — Other Ambulatory Visit: Payer: Self-pay

## 2024-01-22 DIAGNOSIS — E890 Postprocedural hypothyroidism: Secondary | ICD-10-CM

## 2024-01-22 MED ORDER — LEVOTHYROXINE SODIUM 112 MCG PO TABS: ORAL_TABLET | ORAL | 3 refills | Status: DC

## 2024-01-23 LAB — OB RESULTS CONSOLE GC/CHLAMYDIA
Chlamydia: NEGATIVE
Neisseria Gonorrhea: NEGATIVE

## 2024-01-23 LAB — OB RESULTS CONSOLE GBS: GBS: NEGATIVE

## 2024-01-26 ENCOUNTER — Ambulatory Visit
Admission: RE | Admit: 2024-01-26 | Discharge: 2024-01-26 | Disposition: A | Discharge status: Home / Self Care | Source: Ambulatory Visit | Attending: Obstetrics and Gynecology | Admitting: Obstetrics and Gynecology

## 2024-01-26 DIAGNOSIS — O99013 Anemia complicating pregnancy, third trimester: Secondary | ICD-10-CM | POA: Diagnosis not present

## 2024-01-26 DIAGNOSIS — D509 Iron deficiency anemia, unspecified: Secondary | ICD-10-CM | POA: Insufficient documentation

## 2024-01-26 DIAGNOSIS — R790 Abnormal level of blood mineral: Secondary | ICD-10-CM | POA: Diagnosis not present

## 2024-01-26 MED ORDER — ALBUTEROL SULFATE (2.5 MG/3ML) 0.083% IN NEBU: 2.5000 mg | INHALATION_SOLUTION | Freq: Once | RESPIRATORY_TRACT | Status: DC | PRN

## 2024-01-26 MED ORDER — METHYLPREDNISOLONE SODIUM SUCC 125 MG IJ SOLR: 125.0000 mg | Freq: Once | INTRAMUSCULAR | Status: DC | PRN

## 2024-01-26 MED ORDER — SODIUM CHLORIDE 0.9 % IV BOLUS: 500.0000 mL | Freq: Once | INTRAVENOUS | Status: DC | PRN

## 2024-01-26 MED ORDER — DIPHENHYDRAMINE HCL 50 MG/ML IJ SOLN: 25.0000 mg | Freq: Once | INTRAMUSCULAR | Status: DC | PRN

## 2024-01-26 MED ORDER — SODIUM CHLORIDE 0.9 % IV SOLN
INTRAVENOUS | Status: DC | PRN
  Administered 2024-01-26: 10 mL/h via INTRAVENOUS

## 2024-01-26 MED ORDER — EPINEPHRINE 0.3 MG/0.3ML IJ SOAJ
0.3000 mg | Freq: Once | INTRAMUSCULAR | Status: DC | PRN
  Filled 2024-01-26: qty 0.3

## 2024-01-26 MED ORDER — IRON SUCROSE 500 MG IVPB - SIMPLE MED
500.0000 mg | Freq: Once | INTRAVENOUS | Status: AC
  Administered 2024-01-26: 500 mg via INTRAVENOUS
  Filled 2024-01-26: qty 500

## 2024-02-09 ENCOUNTER — Other Ambulatory Visit: Payer: Self-pay

## 2024-02-09 DIAGNOSIS — Z3490 Encounter for supervision of normal pregnancy, unspecified, unspecified trimester: Secondary | ICD-10-CM

## 2024-02-09 DIAGNOSIS — R790 Abnormal level of blood mineral: Secondary | ICD-10-CM

## 2024-02-09 DIAGNOSIS — E039 Hypothyroidism, unspecified: Secondary | ICD-10-CM

## 2024-02-09 DIAGNOSIS — O280 Abnormal hematological finding on antenatal screening of mother: Secondary | ICD-10-CM

## 2024-02-09 NOTE — Progress Notes (Signed)
 G2P1001 at [redacted]w[redacted]d,Patient's last menstrual period was 05/10/2023 (approximate)., (irregular menses with 33-35 day cycles, not c/w early US  at 105w6d.  Scheduled for induction of labor for elective at term on 02/10/2024 at 0001.   Prenatal provider: [facility] Pregnancy complicated by: 1. Encounter for elective induction of labor   2. Low maternal serum vitamin B12   3. Low serum ferritin level   4. Hypothyroidism affecting pregnancy, antepartum    -currently takes 112 mcg Synthroid  daily with 2 tablets on Monday and Thursday   -Pre-pregnant dose was 112 mcg Synthroid  daily   Prenatal Labs: Blood type/Rh A POS  Antibody screen neg  Rubella Immune    Varicella Immune  RPR NR  HBsAg Neg  Hep C NR  HIV NR  GC neg  Chlamydia neg  Genetic screening cfDNA negative  1 hour GTT 118  3 hour GTT N/A  GBS Neg   Flu: due in season Tdap: Given prenatally RSV: not in season Contraception: NFP, condoms Feeding preference: breast feeding Pediatrician: [facility]  ____ Therisa Pillow, CNM Certified Nurse Midwife [facility]

## 2024-02-10 ENCOUNTER — Other Ambulatory Visit: Payer: Self-pay

## 2024-02-10 ENCOUNTER — Encounter: Payer: Self-pay | Admitting: Obstetrics and Gynecology

## 2024-02-10 ENCOUNTER — Inpatient Hospital Stay: Admitting: Anesthesiology

## 2024-02-10 ENCOUNTER — Inpatient Hospital Stay
Admission: EM | Admit: 2024-02-10 | Discharge: 2024-02-11 | DRG: 807 | Disposition: A | Discharge status: Home / Self Care | Attending: Obstetrics | Admitting: Obstetrics

## 2024-02-10 DIAGNOSIS — E039 Hypothyroidism, unspecified: Secondary | ICD-10-CM | POA: Diagnosis present

## 2024-02-10 DIAGNOSIS — O26893 Other specified pregnancy related conditions, third trimester: Secondary | ICD-10-CM | POA: Diagnosis present

## 2024-02-10 DIAGNOSIS — O280 Abnormal hematological finding on antenatal screening of mother: Secondary | ICD-10-CM | POA: Diagnosis present

## 2024-02-10 DIAGNOSIS — Z8249 Family history of ischemic heart disease and other diseases of the circulatory system: Secondary | ICD-10-CM

## 2024-02-10 DIAGNOSIS — Z79890 Hormone replacement therapy (postmenopausal): Secondary | ICD-10-CM

## 2024-02-10 DIAGNOSIS — R790 Abnormal level of blood mineral: Secondary | ICD-10-CM | POA: Diagnosis present

## 2024-02-10 DIAGNOSIS — E890 Postprocedural hypothyroidism: Secondary | ICD-10-CM | POA: Diagnosis present

## 2024-02-10 DIAGNOSIS — Z3A39 39 weeks gestation of pregnancy: Secondary | ICD-10-CM

## 2024-02-10 DIAGNOSIS — O99284 Endocrine, nutritional and metabolic diseases complicating childbirth: Secondary | ICD-10-CM | POA: Diagnosis present

## 2024-02-10 DIAGNOSIS — Z3490 Encounter for supervision of normal pregnancy, unspecified, unspecified trimester: Principal | ICD-10-CM | POA: Diagnosis present

## 2024-02-10 LAB — CBC
HCT: 33.6 % — ABNORMAL LOW (ref 36.0–46.0)
Hemoglobin: 12.1 g/dL (ref 12.0–15.0)
MCH: 32.2 pg (ref 26.0–34.0)
MCHC: 36 g/dL (ref 30.0–36.0)
MCV: 89.4 fL (ref 80.0–100.0)
Platelets: 257 10*3/uL (ref 150–400)
RBC: 3.76 MIL/uL — ABNORMAL LOW (ref 3.87–5.11)
RDW: 13.1 % (ref 11.5–15.5)
WBC: 12.6 10*3/uL — ABNORMAL HIGH (ref 4.0–10.5)
nRBC: 0 % (ref 0.0–0.2)

## 2024-02-10 LAB — TYPE AND SCREEN
ABO/RH(D): A POS
Antibody Screen: NEGATIVE

## 2024-02-10 LAB — RPR: RPR Ser Ql: NONREACTIVE

## 2024-02-10 MED ORDER — SODIUM CHLORIDE 0.9% FLUSH: 3.0000 mL | Freq: Two times a day (BID) | INTRAVENOUS | Status: DC

## 2024-02-10 MED ORDER — OXYTOCIN-SODIUM CHLORIDE 30-0.9 UT/500ML-% IV SOLN
1.0000 m[IU]/min | INTRAVENOUS | Status: DC
  Filled 2024-02-10: qty 500

## 2024-02-10 MED ORDER — FENTANYL-BUPIVACAINE-NACL 0.5-0.125-0.9 MG/250ML-% EP SOLN
12.0000 mL/h | EPIDURAL | Status: DC | PRN
  Administered 2024-02-10: 12 mL/h via EPIDURAL
  Filled 2024-02-10: qty 250

## 2024-02-10 MED ORDER — DIPHENHYDRAMINE HCL 50 MG/ML IJ SOLN: 12.5000 mg | INTRAMUSCULAR | Status: DC | PRN

## 2024-02-10 MED ORDER — MISOPROSTOL 25 MCG QUARTER TABLET
ORAL_TABLET | ORAL | Status: AC
  Filled 2024-02-10: qty 1

## 2024-02-10 MED ORDER — BENZOCAINE-MENTHOL 20-0.5 % EX AERO
1.0000 | INHALATION_SPRAY | CUTANEOUS | Status: DC | PRN
  Administered 2024-02-10: 1 via TOPICAL
  Filled 2024-02-10 (×3): qty 56

## 2024-02-10 MED ORDER — PRENATAL MULTIVITAMIN CH
1.0000 | ORAL_TABLET | Freq: Every day | ORAL | Status: DC
  Administered 2024-02-11: 1 via ORAL
  Filled 2024-02-10: qty 1

## 2024-02-10 MED ORDER — OXYTOCIN 10 UNIT/ML IJ SOLN
INTRAMUSCULAR | Status: DC
Start: 2024-02-10 — End: 2024-02-10
  Filled 2024-02-10: qty 2

## 2024-02-10 MED ORDER — FENTANYL CITRATE (PF) 100 MCG/2ML IJ SOLN: 50.0000 ug | INTRAMUSCULAR | Status: DC | PRN

## 2024-02-10 MED ORDER — LIDOCAINE HCL (PF) 1 % IJ SOLN
INTRAMUSCULAR | Status: AC
  Filled 2024-02-10: qty 30

## 2024-02-10 MED ORDER — TRANEXAMIC ACID-NACL 1000-0.7 MG/100ML-% IV SOLN
INTRAVENOUS | Status: AC
  Filled 2024-02-10: qty 100

## 2024-02-10 MED ORDER — MISOPROSTOL 25 MCG QUARTER TABLET
25.0000 ug | ORAL_TABLET | ORAL | Status: AC
  Administered 2024-02-10: 25 ug via VAGINAL
  Filled 2024-02-10 (×2): qty 1

## 2024-02-10 MED ORDER — LIDOCAINE HCL (PF) 1 % IJ SOLN: 30.0000 mL | INTRAMUSCULAR | Status: DC | PRN

## 2024-02-10 MED ORDER — MISOPROSTOL 25 MCG QUARTER TABLET
25.0000 ug | ORAL_TABLET | ORAL | Status: DC
  Filled 2024-02-10: qty 1

## 2024-02-10 MED ORDER — PHENYLEPHRINE 80 MCG/ML (10ML) SYRINGE FOR IV PUSH (FOR BLOOD PRESSURE SUPPORT): 80.0000 ug | PREFILLED_SYRINGE | INTRAVENOUS | Status: DC | PRN

## 2024-02-10 MED ORDER — AMMONIA AROMATIC IN INHA
RESPIRATORY_TRACT | Status: AC
  Filled 2024-02-10: qty 10

## 2024-02-10 MED ORDER — ACETAMINOPHEN 500 MG PO TABS: 1000.0000 mg | ORAL_TABLET | Freq: Four times a day (QID) | ORAL | Status: DC | PRN

## 2024-02-10 MED ORDER — WITCH HAZEL-GLYCERIN EX PADS
1.0000 | MEDICATED_PAD | CUTANEOUS | Status: DC | PRN
  Administered 2024-02-10: 1 via TOPICAL
  Filled 2024-02-10: qty 200
  Filled 2024-02-10 (×2): qty 100

## 2024-02-10 MED ORDER — EPHEDRINE 5 MG/ML INJ: 10.0000 mg | INTRAVENOUS | Status: DC | PRN

## 2024-02-10 MED ORDER — CEFAZOLIN SODIUM-DEXTROSE 2-4 GM/100ML-% IV SOLN
2.0000 g | Freq: Once | INTRAVENOUS | Status: AC
  Administered 2024-02-10: 2 g via INTRAVENOUS
  Filled 2024-02-10: qty 100

## 2024-02-10 MED ORDER — SENNOSIDES-DOCUSATE SODIUM 8.6-50 MG PO TABS
2.0000 | ORAL_TABLET | Freq: Every day | ORAL | Status: DC
  Administered 2024-02-11: 2 via ORAL
  Filled 2024-02-10: qty 2

## 2024-02-10 MED ORDER — SODIUM CHLORIDE 0.9 % IV SOLN: 250.0000 mL | INTRAVENOUS | Status: DC | PRN

## 2024-02-10 MED ORDER — IBUPROFEN 600 MG PO TABS
600.0000 mg | ORAL_TABLET | Freq: Four times a day (QID) | ORAL | Status: DC
  Administered 2024-02-10 – 2024-02-11 (×4): 600 mg via ORAL
  Filled 2024-02-10 (×4): qty 1

## 2024-02-10 MED ORDER — FERROUS SULFATE 325 (65 FE) MG PO TABS
325.0000 mg | ORAL_TABLET | Freq: Two times a day (BID) | ORAL | Status: DC
  Administered 2024-02-10 – 2024-02-11 (×2): 325 mg via ORAL
  Filled 2024-02-10 (×2): qty 1

## 2024-02-10 MED ORDER — SODIUM CHLORIDE 0.9% FLUSH: 3.0000 mL | INTRAVENOUS | Status: DC | PRN

## 2024-02-10 MED ORDER — DIBUCAINE (PERIANAL) 1 % EX OINT
1.0000 | TOPICAL_OINTMENT | CUTANEOUS | Status: DC | PRN
  Administered 2024-02-10: 1 via RECTAL
  Filled 2024-02-10 (×4): qty 28

## 2024-02-10 MED ORDER — LACTATED RINGERS IV SOLN: 500.0000 mL | Freq: Once | INTRAVENOUS | Status: DC

## 2024-02-10 MED ORDER — LIDOCAINE HCL (PF) 1 % IJ SOLN
INTRAMUSCULAR | Status: DC | PRN
Start: 2024-02-10 — End: 2024-02-10
  Administered 2024-02-10: 3 mL via SUBCUTANEOUS

## 2024-02-10 MED ORDER — MISOPROSTOL 25 MCG QUARTER TABLET
ORAL_TABLET | ORAL | Status: DC
Start: 2024-02-10 — End: 2024-02-10
  Filled 2024-02-10: qty 1

## 2024-02-10 MED ORDER — MISOPROSTOL 25 MCG QUARTER TABLET
25.0000 ug | ORAL_TABLET | ORAL | Status: AC
  Administered 2024-02-10: 25 ug via VAGINAL
  Filled 2024-02-10: qty 1

## 2024-02-10 MED ORDER — ACETAMINOPHEN 500 MG PO TABS
1000.0000 mg | ORAL_TABLET | Freq: Four times a day (QID) | ORAL | Status: DC
  Administered 2024-02-10 – 2024-02-11 (×4): 1000 mg via ORAL
  Filled 2024-02-10 (×4): qty 2

## 2024-02-10 MED ORDER — BUPIVACAINE HCL (PF) 0.25 % IJ SOLN
INTRAMUSCULAR | Status: DC | PRN
  Administered 2024-02-10 (×4): 2 mL via EPIDURAL

## 2024-02-10 MED ORDER — COCONUT OIL OIL
1.0000 | TOPICAL_OIL | Status: DC | PRN
  Administered 2024-02-10: 1 via TOPICAL
  Filled 2024-02-10: qty 7.5
  Filled 2024-02-10: qty 15
  Filled 2024-02-10: qty 7.5

## 2024-02-10 MED ORDER — CALCIUM CARBONATE ANTACID 500 MG PO CHEW: 400.0000 mg | CHEWABLE_TABLET | Freq: Three times a day (TID) | ORAL | Status: DC | PRN

## 2024-02-10 MED ORDER — TERBUTALINE SULFATE 1 MG/ML IJ SOLN: 0.2500 mg | Freq: Once | INTRAMUSCULAR | Status: DC | PRN

## 2024-02-10 MED ORDER — SODIUM CHLORIDE 0.9 % IV SOLN
INTRAVENOUS | Status: DC | PRN
  Administered 2024-02-10: 5 mL via EPIDURAL

## 2024-02-10 MED ORDER — ONDANSETRON HCL 4 MG PO TABS: 4.0000 mg | ORAL_TABLET | ORAL | Status: DC | PRN

## 2024-02-10 MED ORDER — LEVOTHYROXINE SODIUM 112 MCG PO TABS
112.0000 ug | ORAL_TABLET | Freq: Every day | ORAL | Status: DC
  Administered 2024-02-11: 112 ug via ORAL
  Filled 2024-02-10: qty 1

## 2024-02-10 MED ORDER — OXYTOCIN BOLUS FROM INFUSION
333.0000 mL | Freq: Once | INTRAVENOUS | Status: AC
  Administered 2024-02-10: 333 mL via INTRAVENOUS

## 2024-02-10 MED ORDER — OXYTOCIN-SODIUM CHLORIDE 30-0.9 UT/500ML-% IV SOLN
2.5000 [IU]/h | INTRAVENOUS | Status: DC
  Administered 2024-02-10: 2.5 [IU]/h via INTRAVENOUS
  Filled 2024-02-10: qty 500

## 2024-02-10 MED ORDER — MISOPROSTOL 25 MCG QUARTER TABLET
25.0000 ug | ORAL_TABLET | ORAL | Status: AC
  Administered 2024-02-10 (×2): 25 ug via ORAL
  Filled 2024-02-10: qty 1

## 2024-02-10 MED ORDER — ZOLPIDEM TARTRATE 5 MG PO TABS: 5.0000 mg | ORAL_TABLET | Freq: Every evening | ORAL | Status: DC | PRN

## 2024-02-10 MED ORDER — SOD CITRATE-CITRIC ACID 500-334 MG/5ML PO SOLN: 30.0000 mL | ORAL | Status: DC | PRN

## 2024-02-10 MED ORDER — SIMETHICONE 80 MG PO CHEW: 80.0000 mg | CHEWABLE_TABLET | ORAL | Status: DC | PRN

## 2024-02-10 MED ORDER — LIDOCAINE-EPINEPHRINE (PF) 1.5 %-1:200000 IJ SOLN
INTRAMUSCULAR | Status: DC | PRN
  Administered 2024-02-10: 4 mL via EPIDURAL

## 2024-02-10 MED ORDER — PHENYLEPHRINE 80 MCG/ML (10ML) SYRINGE FOR IV PUSH (FOR BLOOD PRESSURE SUPPORT)
80.0000 ug | PREFILLED_SYRINGE | INTRAVENOUS | Status: DC | PRN
Start: 2024-02-10 — End: 2024-02-10

## 2024-02-10 MED ORDER — METHYLERGONOVINE MALEATE 0.2 MG/ML IJ SOLN
INTRAMUSCULAR | Status: AC
  Filled 2024-02-10: qty 1

## 2024-02-10 MED ORDER — LACTATED RINGERS IV SOLN: 500.0000 mL | INTRAVENOUS | Status: DC | PRN

## 2024-02-10 MED ORDER — LACTATED RINGERS IV SOLN: INTRAVENOUS | Status: DC

## 2024-02-10 MED ORDER — ONDANSETRON HCL 4 MG/2ML IJ SOLN: 4.0000 mg | INTRAMUSCULAR | Status: DC | PRN

## 2024-02-10 MED ORDER — MISOPROSTOL 200 MCG PO TABS
ORAL_TABLET | ORAL | Status: DC
Start: 2024-02-10 — End: 2024-02-10
  Filled 2024-02-10: qty 4

## 2024-02-10 MED ORDER — ONDANSETRON HCL 4 MG/2ML IJ SOLN
4.0000 mg | Freq: Four times a day (QID) | INTRAMUSCULAR | Status: DC | PRN
  Administered 2024-02-10 (×2): 4 mg via INTRAVENOUS
  Filled 2024-02-10 (×2): qty 2

## 2024-02-10 MED ORDER — DIPHENHYDRAMINE HCL 25 MG PO CAPS: 25.0000 mg | ORAL_CAPSULE | Freq: Four times a day (QID) | ORAL | Status: DC | PRN

## 2024-02-10 NOTE — Anesthesia Preprocedure Evaluation (Signed)
 Anesthesia Evaluation  Patient identified by MRN, date of birth, ID band Patient awake    Reviewed: Allergy & Precautions, H&P , NPO status , Patient's Chart, lab work & pertinent test results, reviewed documented beta blocker date and time   Airway Mallampati: II  TM Distance: >3 FB Neck ROM: full    Dental no notable dental hx. (+) Teeth Intact   Pulmonary neg pulmonary ROS   Pulmonary exam normal breath sounds clear to auscultation       Cardiovascular Exercise Tolerance: Good negative cardio ROS  Rhythm:regular Rate:Normal     Neuro/Psych   Anxiety Depression    negative neurological ROS  negative psych ROS   GI/Hepatic negative GI ROS, Neg liver ROS,,,  Endo/Other  Hypothyroidism    Renal/GU negative Renal ROS  negative genitourinary   Musculoskeletal   Abdominal   Peds  Hematology negative hematology ROS (+)   Anesthesia Other Findings   Reproductive/Obstetrics                             Anesthesia Physical Anesthesia Plan  ASA: 2  Anesthesia Plan: Epidural   Post-op Pain Management:    Induction:   PONV Risk Score and Plan:   Airway Management Planned:   Additional Equipment:   Intra-op Plan:   Post-operative Plan:   Informed Consent: I have reviewed the patients History and Physical, chart, labs and discussed the procedure including the risks, benefits and alternatives for the proposed anesthesia with the patient or authorized representative who has indicated his/her understanding and acceptance.     Dental Advisory Given  Plan Discussed with: CRNA  Anesthesia Plan Comments:        Anesthesia Quick Evaluation

## 2024-02-10 NOTE — Anesthesia Procedure Notes (Addendum)
 Epidural Patient location during procedure: OB Start time: 02/10/2024 11:43 AM End time: 02/10/2024 11:45 AM  Staffing Anesthesiologist: Myra Lynwood MATSU, MD Resident/CRNA: Lacretia Camelia NOVAK, CRNA Performed: resident/CRNA   Preanesthetic Checklist Completed: patient identified, IV checked, site marked, risks and benefits discussed, surgical consent, monitors and equipment checked, pre-op evaluation and timeout performed  Epidural Patient position: sitting Prep: ChloraPrep Patient monitoring: heart rate, continuous pulse ox and blood pressure Approach: midline Location: L3-L4 Injection technique: LOR saline  Needle:  Needle type: Tuohy  Needle gauge: 17 G Needle length: 9 cm and 9 Needle insertion depth: 7 cm Catheter type: closed end flexible Catheter size: 19 Gauge Catheter at skin depth: 12 cm Test dose: negative and 1.5% lidocaine  with Epi 1:200 K  Assessment Sensory level: T10 Events: blood not aspirated, no cerebrospinal fluid, injection not painful, no injection resistance, no paresthesia and negative IV test  Additional Notes 1 attempt Pt. Evaluated and documentation done after procedure finished. Patient identified. Risks/Benefits/Options discussed with patient including but not limited to bleeding, infection, nerve damage, paralysis, failed block, incomplete pain control, headache, blood pressure changes, nausea, vomiting, reactions to medication both or allergic, itching and postpartum back pain. Confirmed with bedside nurse the patient's most recent platelet count. Confirmed with patient that they are not currently taking any anticoagulation, have any bleeding history or any family history of bleeding disorders. Patient expressed understanding and wished to proceed. All questions were answered. Sterile technique was used throughout the entire procedure. Please see nursing notes for vital signs. Test dose was given through epidural catheter and negative prior to continuing  to dose epidural or start infusion. Warning signs of high block given to the patient including shortness of breath, tingling/numbness in hands, complete motor block, or any concerning symptoms with instructions to call for help. Patient was given instructions on fall risk and not to get out of bed. All questions and concerns addressed with instructions to call with any issues or inadequate analgesia.    Patient tolerated the insertion well without immediate complications.Reason for block:procedure for pain

## 2024-02-10 NOTE — Discharge Instructions (Signed)

## 2024-02-10 NOTE — Lactation Note (Signed)
 This note was copied from a baby's chart. Lactation Consultation Note  Patient Name: Debra [name] Date: 02/10/2024 Age:29 hours Reason for consult: L&D Initial assessment   Maternal Data Does the patient have breastfeeding experience prior to this delivery?: Yes How long did the patient breastfeed?: 13months  LC called to room to assist patient w/ a feeding and to answer questions mom had.  This was an initial assessment w/ a 1hr old baby Debra and P2 patient.  This was a SVD.  Patient w/ a hx of postoperative hypothyroidism, general anxiety disorder, and Graves Disease.  Mom stated that she did have a Spectra  pump at home.    Feeding Mother's Current Feeding Choice: Breast Milk  Infant latched to the left breast upon entry into the room.  Patient wanted lactation to assess infants latch.  Infant has a wide open gape, with audible swallows heard and EBM seen once infant delatched from the breast.   LATCH Score Latch: Grasps breast easily, tongue down, lips flanged, rhythmical sucking.  Audible Swallowing: Spontaneous and intermittent  Type of Nipple: Everted at rest and after stimulation  Comfort (Breast/Nipple): Soft / non-tender  Hold (Positioning): No assistance needed to correctly position infant at breast.  LATCH Score: 10  Interventions Interventions: Breast feeding basics reviewed;Breast compression;Education  LC reviewed number of wet/dirty diapers, and to feed infant on demand at least 8 - 12x per 24hrs.   LC provided education on identifying hunger cues as well.  Consult Status Consult Status: Follow-up Follow-up type: In-patient    Gailene Youkhana S Jamaira Sherk 02/10/2024, 3:47 PM

## 2024-02-10 NOTE — H&P (Signed)
 OB History & Physical   History of Present Illness:   Chief Complaint: scheduled induction of labor   HPI:  Debra [name] is a 28 y.o. G2P1001 female at [redacted]w[redacted]d, Patient's last menstrual period was 05/10/2023 (approximate)., not consistent with US  at [redacted]w[redacted]d, with Estimated Date of Delivery: 02/17/24.  She presents to L&D for scheduled induction of labor for elective at term.  Her pregnancy is complicated by postoperative hypothyroidism, low ferritin levels, and low maternal serum B12.  She denies Contractions, Loss of fluid, or Vaginal bleeding. Endorses fetal movement as active.   Reports active fetal movement  Contractions: irregular cramping prior to misoprostol   LOF/SROM: denies  Vaginal bleeding: denies   Factors complicating pregnancy:  Principal Problem:   Encounter for elective induction of labor Active Problems:   Postoperative hypothyroidism   Hypothyroidism affecting pregnancy, antepartum   Low maternal serum vitamin B12   Low serum ferritin level    Prenatal care site:  [facility]  Patient Active Problem List   Diagnosis Date Noted   Encounter for elective induction of labor 02/10/2024   Low maternal serum vitamin B12 12/01/2023   Low serum ferritin level 12/01/2023   Supervision of normal pregnancy 06/30/2023   Hypothyroidism affecting pregnancy, antepartum 01/31/2021   Chronic constipation 03/31/2020   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   GAD (generalized anxiety disorder) 03/31/2020   Postoperative hypothyroidism 08/09/2016   S/P total thyroidectomy 08/09/2016   H/O Graves' disease 01/10/2015    Prenatal Transfer Tool  Maternal Diabetes: No Genetic Screening: Normal Maternal Ultrasounds/Referrals: Normal Fetal Ultrasounds or other Referrals:  None Maternal Substance Abuse:  No Significant Maternal Medications:  Meds include: Syntroid Significant Maternal Lab Results: Group B Strep negative  Maternal Medical History:   Past Medical History:   Diagnosis Date   Allergy    Anxiety    Depression    History of irregular menstrual cycles    Irregular menses    Thyroid  disease    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 Authorizing Provider  clobetasol  (OLUX ) 0.05 % topical foam Apply topically 2 (two) times daily. 12/25/21  Yes Bacigalupo, Angela M, MD  Ferrous Sulfate (IRON  PO) Take 1 tablet by mouth daily.   Yes [provider]  levothyroxine  (SYNTHROID ) 112 MCG tablet Take 112 mcg by mouth daily before breakfast. Take 2 tablets by mouth Monday and Thursday 08/09/23  Yes [provider]  Prenatal Vit-Fe Fumarate-FA (PRENATAL MULTIVITAMIN) TABS tablet Take 1 tablet by mouth daily at 12 noon.   Yes [provider]    OB History  Gravida Para Term Preterm AB Living  2 1 1  0 0 1  SAB IAB Ectopic Multiple Live Births  0 0 0 0 1    # Outcome Date GA Lbr Len/2nd Weight Sex Type Anes PTL Lv  2 Current           1 Term 09/08/21 [redacted]w[redacted]d / 01:12 3140 g M Vag-Spont EPI  LIV     Name: Debra [name],Debra [name]     Apgar1: 7  Apgar5: 9     Social History: She  reports that she has never smoked. She has never used smokeless  tobacco. She reports that she does not currently use alcohol. She reports that she does not use drugs.  Family History: family history includes Arthritis in her paternal grandmother; Gout in her father and paternal grandmother; Heart Problems in her paternal grandfather; Hypertension in her father, maternal grandfather, and paternal grandmother; Intellectual disability in her paternal grandfather; Melanoma in her paternal grandmother; Stroke in her maternal grandfather; Thyroid  disease in an  other family member.   Review of Systems: A full review of systems was performed and negative except as noted in the HPI.     Physical Exam:  Vital Signs: BP 118/75 (BP Location: Right Arm)   Pulse 68   Temp 97.6 F (36.4 C) (Oral)   Resp 15   Ht 5' 9 (1.753 m)   Wt 99.9 kg   LMP 05/10/2023 (Approximate)   BMI 32.52 kg/m   General: no acute distress.  HEENT: normocephalic, atraumatic Heart: regular rate & rhythm Lungs: normal respiratory effort Abdomen: soft, gravid, non-tender;  EFW: 7 1/2 lbs  Pelvic:   External: Normal external female genitalia  Cervix: Dilation: 3 / Effacement (%): 70 / Station: -1    Extremities: non-tender, symmetric, No edema bilaterally.  DTRs: 2+/2+  Neurologic: Alert & oriented x 3.    Results for orders placed or performed during the hospital encounter of 02/10/24 (from the past 24 hours)  CBC     Status: Abnormal   Collection Time: 02/10/24  1:46 AM  Result Value Ref Range   WBC 12.6 (H) 4.0 - 10.5 K/uL   RBC 3.76 (L) 3.87 - 5.11 MIL/uL   Hemoglobin 12.1 12.0 - 15.0 g/dL   HCT 66.3 (L) 63.9 - 53.9 %   MCV 89.4 80.0 - 100.0 fL   MCH 32.2 26.0 - 34.0 pg   MCHC 36.0 30.0 - 36.0 g/dL   RDW 86.8 88.4 - 84.4 %   Platelets 257 150 - 400 K/uL   nRBC 0.0 0.0 - 0.2 %  Type and screen     Status: None   Collection Time: 02/10/24  1:46 AM  Result Value Ref Range   ABO/RH(D) A POS    Antibody Screen NEG    Sample Expiration      02/13/2024,2359 Performed at [facility], [address] [place], [place] [address]   RPR     Status: None   Collection Time: 02/10/24  1:46 AM  Result Value Ref Range   RPR Ser Ql NON REACTIVE NON REACTIVE    Pertinent Results:  Prenatal Labs: Blood type/Rh A POS   Antibody screen Negative    Rubella Immune (12/10 0000)   Varicella Immune  RPR NON REACTIVE (06/24 0146)   HBsAg Negative (12/10 0000)  Hep C NR   HIV Non-reactive (04/14 0000)   GC neg  Chlamydia neg  Genetic screening cfDNA  negative   1 hour GTT 118  3 hour GTT N/A  GBS Negative/-- (06/06 0000)    FHT:  FHR: 135 bpm, variability: moderate,  accelerations:  Present,  decelerations:  Absent Category/reactivity:  Category I UC:   regular, every 2-3 minutes since misoprostol  given    Cephalic by [place] and SVE   No results found.  Assessment:  REEVA DAVERN is a 28 y.o. G2P1001 female at [redacted]w[redacted]d with elect.   Plan:  1. Admit to Labor & Delivery - Admission status: Inpatient - Dr WENDI Penton MD notified of admission and plan of care  - Reason for admission: induction of  labor - consents reviewed and obtained  2. Fetal Well being  - Fetal Tracing: cat 1 - Group B Streptococcus ppx not indicated: GBS negative - Presentation: cephalic confirmed by SVE   3. Routine OB: - Prenatal labs reviewed, as above - Rh positive - CBC, T&S, RPR on admit - Regular diet, saline lock  4. Induction of labor  - Contractions monitored with external toco - Pelvis proven to 3140 grams, adequate for trial of labor  - Plan for induction with misoprostol   - Induction with oxytocin  and AROM as appropriate  - Plan for  continuous fetal monitoring - Maternal pain control as desired; planning regional anesthesia - Anticipate vaginal delivery  5. Postoperative hypothyroidism - Currently taking 112 mcg daily with 2 tablets on Monday and Thursday - Pre-pregnant dose was 112 mcg daily  - Will decrease to pre-pregnant dose postpartum   6. Post Partum Planning: - Infant feeding: breast feeding - Contraception: NFP and condoms  - Flu vaccine: declined  - Tdap vaccine: Given prenatally - RSV vaccine: not in season   Therisa CHRISTELLA Pillow, [place] 02/10/24 7:27 AM  Therisa Pillow, CNM Certified Nurse Midwife [facility]

## 2024-02-10 NOTE — Discharge Summary (Signed)
 Postpartum Discharge Summary  Patient Name: Debra [name] DOB: [birth year] MRN: [id number]  Date of admission: 02/10/2024 Delivery date:02/10/2024 Delivering provider: MACKIE, ANNA M Date of discharge: 02/11/2024  Primary OB: [facility] OFE:Ejupzwu'd last menstrual period was 05/10/2023 (approximate). EDC Estimated Date of Delivery: 02/17/24 Gestational Age at Delivery: [redacted]w[redacted]d   Admitting diagnosis: Encounter for elective induction of labor [Z34.90] Intrauterine pregnancy: [redacted]w[redacted]d     Secondary diagnosis:   Principal Problem:   Encounter for elective induction of labor Active Problems:   Postoperative hypothyroidism   Hypothyroidism affecting pregnancy, antepartum   Low maternal serum vitamin B12   Low serum ferritin level   Discharge Diagnosis: Term Pregnancy Delivered                                                Post partum procedures:none Induction:: Cytotec  Complications: None Delivery Type: spontaneous vaginal delivery Anesthesia: epidural anesthesia Placenta: spontaneous To Pathology: No  Laceration: periurethral - no repair  Episiotomy: none  Prenatal Labs:  Blood type/Rh A POS   Antibody screen Negative    Rubella Immune (12/10 0000)   Varicella Immune  RPR NON REACTIVE (06/24 0146)   HBsAg Negative (12/10 0000)  Hep C NR   HIV Non-reactive (04/14 0000)   GC neg  Chlamydia neg  Genetic screening cfDNA negative   1 hour GTT 118  3 hour GTT N/A  GBS Negative/-- (06/06 0000)     Hospital course: Induction of Labor With Vaginal Delivery   28 y.o. yo G2P2002 at [redacted]w[redacted]d was admitted to the hospital 02/10/2024 for induction of labor.  Indication for induction: Elective.  Patient had an uncomplicated labor course. Debra [name] presented to L&D for scheduled elective induction of labor at term. Misoprostol  was used for cervical ripening. She started to labor spontaneously after SROM. She progressed to C/C/+3 but did not have an urge to push due to a recent  epidural. Debra [name] was instructed on pushing techniques and pushed effectively over approximately 7 minutes for a spontaneous vaginal birth.  Membrane Rupture Time/Date: 10:52 AM,02/10/2024  Delivery Method:Vaginal, Spontaneous Operative Delivery:N/A Episiotomy:   Lacerations:  Periurethral Details of delivery can be found in separate delivery note.  Patient had an uncomplicated postpartum course. Patient is discharged home 02/11/24.  Newborn Data: Birth date:02/10/2024 Birth time:2:21 PM Gender:Female Noah Living status:Living Apgars:8 ,9  Weight:3740 g  Magnesium Sulfate received: No BMZ received: No Rhophylac:No MMR:N/A Varivax vaccine given: was not indicated T-DaP:Given prenatally Flu: N/A  Transfusion:No  Physical exam  Vitals:   02/10/24 2000 02/10/24 2300 02/11/24 0330 02/11/24 0744  BP: 118/77 123/75 107/67 109/74  Pulse: 68 72 62 65  Resp: 18 19 17 18   Temp: 97.7 F (36.5 C) 97.9 F (36.6 C) 97.8 F (36.6 C) 98.3 F (36.8 C)  TempSrc: Oral Oral Oral Oral  SpO2: 95% 97% 98% 99%  Weight:      Height:       General: alert, cooperative, and no distress Lochia: appropriate Uterine Fundus: firm Perineum:minimal edema/laceration hemostatic DVT Evaluation: No evidence of DVT seen on physical exam. Negative Homan's sign. No cords or calf tenderness. No significant calf/ankle edema.  Labs: Lab Results  Component Value Date   WBC 13.1 (H) 02/11/2024   HGB 11.4 (L) 02/11/2024   HCT 32.4 (L) 02/11/2024   MCV 91.5 02/11/2024   PLT 242 02/11/2024  Latest Ref Rng & Units 10/22/2023    2:18 AM  CMP  Glucose 70 - 99 mg/dL 81   BUN 6 - 20 mg/dL <5   Creatinine 9.55 - 1.00 mg/dL 9.55   Sodium 864 - 854 mmol/L 134   Potassium 3.5 - 5.1 mmol/L 3.7   Chloride 98 - 111 mmol/L 104   CO2 22 - 32 mmol/L 22   Calcium 8.9 - 10.3 mg/dL 7.6   Total Protein 6.5 - 8.1 g/dL 5.1   Total Bilirubin 0.0 - 1.2 mg/dL 1.1   Alkaline Phos 38 - 126 U/L 50   AST 15 - 41 U/L 20    ALT 0 - 44 U/L 20    Edinburgh Score:    02/10/2024    8:00 PM  Edinburgh Postnatal Depression Scale Screening Tool  I have been able to laugh and see the funny side of things. --    Risk assessment for postpartum VTE and prophylactic treatment: Very high risk factors: None High risk factors: None Moderate risk factors: None  Postpartum VTE prophylaxis with LMWH not indicated  After visit meds:  Allergies as of 02/11/2024       Reactions   Sulfa Antibiotics Rash        Medication List     TAKE these medications    acetaminophen  500 MG tablet Commonly known as: TYLENOL  Take 2 tablets (1,000 mg total) by mouth every 6 (six) hours.   benzocaine -Menthol  20-0.5 % Aero Commonly known as: DERMOPLAST Apply 1 Application topically as needed for irritation (perineal discomfort).   clobetasol  0.05 % topical foam Commonly known as: OLUX  Apply topically 2 (two) times daily.   coconut oil Oil Apply 1 Application topically as needed.   dibucaine 1 % Oint Commonly known as: NUPERCAINAL Place 1 Application rectally as needed for hemorrhoids.   ferrous sulfate 325 (65 FE) MG tablet Take 1 tablet (325 mg total) by mouth 2 (two) times daily with a meal. What changed:  medication strength how much to take when to take this   ibuprofen  600 MG tablet Commonly known as: ADVIL  Take 1 tablet (600 mg total) by mouth every 6 (six) hours.   levothyroxine  112 MCG tablet Commonly known as: SYNTHROID  Take 1 tablet (112 mcg total) by mouth daily at 6 (six) AM. Start taking on: February 12, 2024 What changed:  when to take this additional instructions   prenatal multivitamin Tabs tablet Take 1 tablet by mouth daily at 12 noon.   senna-docusate 8.6-50 MG tablet Commonly known as: Senokot-S Take 2 tablets by mouth daily. Start taking on: February 12, 2024   simethicone  80 MG chewable tablet Commonly known as: MYLICON Chew 1 tablet (80 mg total) by mouth as needed for flatulence.    witch hazel-glycerin  pad Commonly known as: TUCKS Apply 1 Application topically as needed for hemorrhoids.       Discharge home in stable condition Infant Feeding: Breast Infant Disposition:home with mother Discharge instruction: per After Visit Summary and Postpartum booklet. Activity: Advance as tolerated. Pelvic rest for 6 weeks.  Diet: routine diet Anticipated Birth Control: NFP and condoms  Postpartum Appointment:6 weeks Additional Postpartum F/U: Postpartum Depression checkup Future Appointments: Future Appointments  Date Time Provider Department Center  04/06/2024 11:20 AM Trixie File, MD [facility] None   Follow up Visit:  Follow-up Information     Vernel Therisa HERO, CNM. Schedule an appointment as soon as possible for a visit in 2 week(s).   Specialty: Certified Nurse Midwife Why:  postpartum mood check Contact information: [address] [place] [address] [phone]         Vernel Therisa HERO, CNM. Schedule an appointment as soon as possible for a visit.   Specialty: Certified Nurse Midwife Why: postpartum visit Contact information: [address] [place] [address] [phone]                 Plan:  Debra [name] was discharged to home in good condition. Follow-up appointment as directed.    Signed:  Bobbette Brunswick CNM

## 2024-02-10 NOTE — Progress Notes (Signed)
 L&D Note    Subjective:  Felt a pop and then gush of fluid in the bathroom, contractions are feeling more intense   Objective:    Current Vital Signs 24h Vital Sign Ranges  T 97.6 F (36.4 C) Temp  Avg: 97.8 F (36.6 C)  Min: 97.6 F (36.4 C)  Max: 98.1 F (36.7 C)  BP 118/75 BP  Min: 111/52  Max: 125/74  HR 68 Pulse  Avg: 79.7  Min: 68  Max: 89  RR 15 Resp  Avg: 15.5  Min: 15  Max: 16  SaO2     No data recorded      Gen: alert, cooperative, no distress FHR: Baseline: 135 bpm, Variability: moderate, Accels: Present, Decels: none Toco: regular, every 2-3 minutes SVE: Dilation: 4 Effacement (%): 70 Cervical Position: Anterior Station: -1 Presentation: Vertex Exam by:: Vernel, CNM  Medications SCHEDULED MEDICATIONS   ammonia        lidocaine  (PF)       misoprostol        misoprostol        oxytocin        oxytocin  40 units in LR 1000 mL  333 mL Intravenous Once   sodium chloride  flush  3 mL Intravenous Q12H    MEDICATION INFUSIONS   sodium chloride      lactated ringers      lactated ringers  Stopped (02/10/24 0656)   oxytocin      oxytocin       PRN MEDICATIONS  sodium chloride , acetaminophen , ammonia , calcium carbonate, fentaNYL  (SUBLIMAZE ) injection, lactated ringers , lidocaine  (PF), lidocaine  (PF), misoprostol , misoprostol , ondansetron , oxytocin , sodium chloride  flush, sodium citrate-citric acid , terbutaline    Assessment & Plan:  28 y.o. G2P1001 at [redacted]w[redacted]d admitted for IOL d/t elective at term -Labor: s/p 2 doses of 25/25 mcg misoprostol . Contracting well after SROM. -Fetal Well-being: Category I -GBS: negative -Membranes ruptured, clear fluid spontaneously at 1052 -Continue present management. Consider oxytocin  augmentation if indicated  -Analgesia: regional anesthesia - IV infiltrated. Will call anesthesia after new IV placed.    Therisa CHRISTELLA Vernel, CNM  02/10/2024 11:06 AM  [facility]

## 2024-02-11 LAB — CBC
HCT: 32.4 % — ABNORMAL LOW (ref 36.0–46.0)
Hemoglobin: 11.4 g/dL — ABNORMAL LOW (ref 12.0–15.0)
MCH: 32.2 pg (ref 26.0–34.0)
MCHC: 35.2 g/dL (ref 30.0–36.0)
MCV: 91.5 fL (ref 80.0–100.0)
Platelets: 242 10*3/uL (ref 150–400)
RBC: 3.54 MIL/uL — ABNORMAL LOW (ref 3.87–5.11)
RDW: 12.9 % (ref 11.5–15.5)
WBC: 13.1 10*3/uL — ABNORMAL HIGH (ref 4.0–10.5)
nRBC: 0 % (ref 0.0–0.2)

## 2024-02-11 MED ORDER — IBUPROFEN 600 MG PO TABS
600.0000 mg | ORAL_TABLET | Freq: Four times a day (QID) | ORAL | 0 refills | Status: AC
Start: 2024-02-11 — End: ?

## 2024-02-11 MED ORDER — LEVOTHYROXINE SODIUM 112 MCG PO TABS: 112.0000 ug | ORAL_TABLET | Freq: Every day | ORAL | 0 refills | Status: DC

## 2024-02-11 MED ORDER — ACETAMINOPHEN 500 MG PO TABS: 1000.0000 mg | ORAL_TABLET | Freq: Four times a day (QID) | ORAL | Status: AC

## 2024-02-11 MED ORDER — DIBUCAINE (PERIANAL) 1 % EX OINT: 1.0000 | TOPICAL_OINTMENT | CUTANEOUS | Status: AC | PRN

## 2024-02-11 MED ORDER — COCONUT OIL OIL: 1.0000 | TOPICAL_OIL | Status: AC | PRN

## 2024-02-11 MED ORDER — BENZOCAINE-MENTHOL 20-0.5 % EX AERO: 1.0000 | INHALATION_SPRAY | CUTANEOUS | Status: AC | PRN

## 2024-02-11 MED ORDER — SIMETHICONE 80 MG PO CHEW: 80.0000 mg | CHEWABLE_TABLET | ORAL | Status: DC | PRN

## 2024-02-11 MED ORDER — WITCH HAZEL-GLYCERIN EX PADS: 1.0000 | MEDICATED_PAD | CUTANEOUS | Status: AC | PRN

## 2024-02-11 MED ORDER — SENNOSIDES-DOCUSATE SODIUM 8.6-50 MG PO TABS: 2.0000 | ORAL_TABLET | Freq: Every day | ORAL | Status: AC

## 2024-02-11 MED ORDER — FERROUS SULFATE 325 (65 FE) MG PO TABS: 325.0000 mg | ORAL_TABLET | Freq: Two times a day (BID) | ORAL | Status: DC

## 2024-02-11 NOTE — Plan of Care (Signed)
 Patient discharged home with family.  Discharge instructions, when to follow up, and prescriptions reviewed with patient.  Patient verbalized understanding. Patient will be escorted out by auxiliary.   Elyn Sharps, RN 02/11/24 @1550 

## 2024-02-11 NOTE — Lactation Note (Signed)
 Lactation Consultation Note  Patient Name: [name] Unijb'd Date: 02/11/2024 Age:28 y.o. Reason for consult: Maternal discharge   Maternal Data Has patient been taught Hand Expression?: Yes Does the patient have breastfeeding experience prior to this delivery?: Yes How long did the patient breastfeed?: 13 months MOB awake and alert with infant swaddled and sleeping upon room entry. MOB denies any pain or concerns regarding feeding, and states her experience with second delivery has been much easier than her older child's was. She reports that feeds have lasted 30 minutes at longest, with audible transfer and many wet and dirty diapers to report throughout admission.   Feeding Mother's Current Feeding Choice: Breast Milk MOB plans to exclusively feed at breast with no interventions or plans to pump noted at this time. No feeding observed, MOB will contact outpatient clinic if any unexpected concerns arise or any additional assessment is requested.    Interventions Interventions: Breast feeding basics reviewed;Skin to skin;Breast massage;Breast compression;Adjust position;Support pillows;Expressed milk;Ice;Education;Infant Driven Feeding Algorithm education LC provided education regarding maternal concerns of waking baby every few hours and baby-led feeding, early stage hunger cues, monitoring input and output, signs of transfer, and breast care and condition pre and post feeds. MOB will follow up with any questions or concerns following discharge.  Discharge Discharge Education: Engorgement and breast care;Warning signs for feeding baby;Outpatient recommendation WIC Program: No  Consult Status Consult Status: Complete    Debra [name] 02/11/2024, 12:53 PM

## 2024-02-11 NOTE — Anesthesia Postprocedure Evaluation (Signed)
 Anesthesia Post Note  Patient: Debra [name]  Procedure(s) Performed: AN AD HOC LABOR EPIDURAL  Patient location during evaluation: Mother Baby Anesthesia Type: Epidural Level of consciousness: awake, awake and alert and oriented Pain management: pain level controlled Vital Signs Assessment: post-procedure vital signs reviewed and stable Respiratory status: spontaneous breathing, nonlabored ventilation and respiratory function stable Cardiovascular status: blood pressure returned to baseline and stable Postop Assessment: no headache and no backache Anesthetic complications: no  No notable events documented.   Last Vitals:  Vitals:   02/10/24 2300 02/11/24 0330  BP: 123/75 107/67  Pulse: 72 62  Resp: 19 17  Temp: 36.6 C 36.6 C  SpO2: 97% 98%    Last Pain:  Vitals:   02/11/24 0330  TempSrc: Oral  PainSc:                  Debra [name]

## 2024-04-06 ENCOUNTER — Encounter: Payer: Self-pay | Admitting: Internal Medicine

## 2024-04-06 ENCOUNTER — Ambulatory Visit (INDEPENDENT_AMBULATORY_CARE_PROVIDER_SITE_OTHER): Payer: BC Managed Care – PPO | Admitting: Internal Medicine

## 2024-04-06 VITALS — BP 120/80 | HR 71 | Ht 69.0 in | Wt 202.0 lb

## 2024-04-06 DIAGNOSIS — E890 Postprocedural hypothyroidism: Secondary | ICD-10-CM

## 2024-04-06 DIAGNOSIS — Z8639 Personal history of other endocrine, nutritional and metabolic disease: Secondary | ICD-10-CM

## 2024-04-06 NOTE — Patient Instructions (Addendum)
 Please continue Levothyroxine  112 mcg.  Take the thyroid  hormone every day, with water, at least 30 minutes before breakfast, separated by at least 4 hours from: - acid reflux medications - calcium  - iron  - multivitamins  Please have repeat labs in 2-3 weeks.  Please come back for a follow-up appointment in 6 months.

## 2024-04-06 NOTE — Progress Notes (Signed)
 Patient ID: Debra [name], female   DOB: [birth year], 28 y.o.   MRN: [id number]  HPI  Debra [name] is a 28 y.o.-year-old female, returning for follow up for h/o uncontrolled Graves ds, now with postsurgical hypothyroidism. Last visit 1 year ago.  Interim history: At her last visit, she was pregnant, [redacted] weeks along.  She gave birth 02/10/2024 to a healthy baby boy.  She is breast-feeding.  She is sleeping 3 to 4 hours at night.  Husband is working long hours.  She gets help from her parents -2-3 times a week.  Reviewed history: She started to have mood swings and anxiety episodes in 2014.  She then developed heat intolerance, tremors, palpitations, fatigue.  Thyroid  US , [facility] (12/30/14) - normal, except heterogeneity: Right thyroid  lobe Measurements: 4.0 x 1.3 x 1.3 cm. No nodules visualized. Mildly heterogeneous tissue.   Left thyroid  lobe Measurements: 4.3 x 1.0 x 1.3 cm. No nodules visualized. Mildly heterogeneous tissue.   Isthmus Thickness: 3 mm.  No nodules visualized.   Lymphadenopathy: None visualized.   IMPRESSION: No evidence of thyroid  nodule. Mildly heterogeneous glandular tissue. Normal size.  Thyroid  Uptake and scan (01/13/2015): The thyroid  scan demonstrates fairly symmetric and homogeneous uptake in the thyroid  gland. No hot or cold nodules are demonstrated.   4 hour I 131 uptake = 51.2% (normal 5-20%)   24 hour I 131 uptake = 55.8% (normal 10-30%)   IMPRESSION: Elevated 4 and 24 hr iodine 123 uptake consistent with hyperthyroidism/Graves disease.  She started MMI 10 mg in am in 12/2014 >> we decreased the dose to 5 mg 2x a day, as her TFTs were improving in 02/2015, however, in 04/2015, her TFTs were still abnormal >> increased MMI to 10 mg 2x a day >>  20-10-20 mg >> 20 mg 3x a day in 12/2015.  Despite this, her TFTs remained abnormal.   She repeatedly refused RAI treatment and conventional thyroidectomy.  I then suggested transaxillary thyroidectomy,  which she had at [facility] 07/29/2016 (Dr. Sibyl) >> she developed postop hypothyroidism.  In pregnancy, patient was on  levothyroxine  112 mcg 9/7 days, dose increased after pregnancy (equivalent of 144 mcg daily).  Now, after pregnancy, she is back on 112 mcg daily. - no missed doses - in am - fasting - + b'fast > 30 min  - no Ca, Fe, PPIs - + prenatal vitamins at night - not on Biotin - on B12  Reviewed patient's TFTs: 03/25/2024: TSH 0.387 (0.45-5.33) 02/12/2024: TSH 0.55 12/30/2023: TSH 0.461 12/01/2023: TSH 1.078 Lab Results  Component Value Date   TSH 4.993 (H) 10/21/2023   TSH 0.11 (L) 06/11/2023   TSH 0.20 (L) 04/03/2023   TSH 0.01 Repeated and verified X2. (L) 02/17/2023   TSH 28.71 (H) 08/09/2022   TSH 8.47 (H) 04/04/2022   TSH 2.910 07/02/2021   TSH 2.010 04/24/2021   TSH 1.780 02/27/2021   TSH 0.516 01/31/2021   FREET4 0.84 10/21/2023   FREET4 1.42 06/11/2023   FREET4 1.4 04/03/2023   FREET4 1.38 02/17/2023   FREET4 1.0 08/09/2022   FREET4 0.9 04/04/2022   FREET4 1.06 07/02/2021   FREET4 1.25 02/27/2021   FREET4 1.60 01/31/2021   FREET4 1.2 12/12/2020    TSI's: Component     Latest Ref Rng & Units 07/02/2021  Thyroid  Stim Immunoglobulin     0.00 - 0.55 IU/L 2.17 (H)   Lab Results  Component Value Date   TSI >700 (H) 03/17/2017   TSI 588 (H) 01/10/2015  07/22/2019: TSH 0.712 11/06/2016: TRAb 9.35 (0-1.75), TSH 20.15 01/10/2015: TSI 588 12/08/14: TSH 0.006, TT4 11.5, T3U 33%,FTI 3.8 TgAB<1 Remainder panel was normal for E2, Progesterone, PRL, testosterone.    Pt denies: - feeling nodules in neck - hoarseness - dysphagia - choking  Pt does have a FH of thyroid  ds. - hyper- or hypo-thyroidism in GGM. No FH of thyroid  cancer. No h/o radiation tx to head or neck. No herbal supplements. No Biotin use. No recent steroids use.   She got married in 2019.   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 She had anxiety related to  teaching.  She previously also had low vitamin D  and B12, currently managed by PCP. She gave birth to a healthy baby boy 09/07/2021.  Also, to a healthy baby boy 02/10/2024.  ROS: + see HPI  I reviewed pt's medications, allergies, PMH, social hx, family hx, and changes were documented in the history of present illness. Otherwise, unchanged from my initial visit note.  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   Velamentous insertion of umbilical cord 07/02/2021   Past Surgical History:  Procedure Laterality Date   TONSILLECTOMY AND ADENOIDECTOMY  2012   TOTAL THYROIDECTOMY     History   Social History   Marital Status: Single    Spouse Name: N/A   Number of Children: 0   Occupational History   student   Social History Main Topics   Smoking status: Never Smoker    Smokeless tobacco: Never Used   Alcohol Use: Yes   Drug Use: No   Current Outpatient Medications on File Prior to Visit  Medication Sig Dispense Refill   acetaminophen  (TYLENOL ) 500 MG tablet Take 2 tablets (1,000 mg total) by mouth every 6 (six) hours.     benzocaine -Menthol  (DERMOPLAST) 20-0.5 % AERO Apply 1 Application topically as needed for irritation (perineal discomfort).     clobetasol  (OLUX ) 0.05 % topical foam Apply topically 2 (two) times daily. 100 g 5   coconut oil OIL Apply 1 Application topically as needed.     dibucaine (NUPERCAINAL) 1 % OINT Place 1 Application rectally as needed for hemorrhoids.     ibuprofen  (ADVIL ) 600 MG tablet Take 1 tablet (600 mg total) by mouth every 6 (six) hours. 30 tablet 0   Prenatal Vit-Fe Fumarate-FA (PRENATAL MULTIVITAMIN) TABS tablet Take 1 tablet by mouth daily at 12 noon.     senna-docusate (SENOKOT-S) 8.6-50 MG tablet Take 2 tablets by mouth daily.     witch hazel-glycerin  (TUCKS) pad Apply 1 Application  topically as needed for hemorrhoids.     levothyroxine  (SYNTHROID ) 112 MCG tablet Take 1 tablet (112 mcg total) by mouth daily at 6 (six) AM. 30 tablet 0   No current facility-administered medications on file prior to visit.   Allergies  Allergen Reactions   Sulfa Antibiotics Rash   Family History  Problem Relation Age of Onset   Hypertension Father    Gout Father    Hypertension Maternal Grandfather    Stroke Maternal Grandfather    Arthritis Paternal Grandmother    Gout Paternal Grandmother    Hypertension Paternal Grandmother    Melanoma Paternal Grandmother    Intellectual disability Paternal Grandfather    Heart Problems Paternal Grandfather  CABG x4   Thyroid  disease Other    PE: BP 120/80 (BP Location: Left Arm, Patient Position: Sitting, Cuff Size: Normal)   Pulse 71   Ht 5' 9 (1.753 m)   Wt 202 lb (91.6 kg)   SpO2 97%   BMI 29.83 kg/m   Wt Readings from Last 3 Encounters:  04/06/24 202 lb (91.6 kg)  02/10/24 220 lb 3.2 oz (99.9 kg)  10/07/23 202 lb 6.4 oz (91.8 kg)   Constitutional: overweight, in NAD Eyes: EOMI, no exophthalmos ENT: no neck masses palpated, no cervical lymphadenopathy Cardiovascular: RRR, No MRG Respiratory: CTA B Musculoskeletal: no deformities Skin: no rashes Neurological: + tremor with outstretched hands (chronic)  ASSESSMENT: 1. H/o Graves ds  2. Postop hypothyroidism  PLAN:  1. Patient with history of severe Graves' disease, previously not responding well to methimazole , now status post transaxillary thyroidectomy by Dr. Sibyl at [facility] - No active signs of Graves' ophthalmopathy  2. Postop hypothyroidism -She gave birth 2 months ago.  She is doing well and losing weight.  Baby is also healthy. - during pregnancy we had her on 112 mcg 9 out of 7 days (equivalent dose of 144 mcg daily). - latest thyroid  labs reviewed with pt. >> TSH was slightly elevated in 10/2023, at 4.99, possibly due to nausea/vomiting or forgetting an  occasional tablet.  However, afterwards, 3 TSH levels were at goal, including on 02/02/2024: 0.55.  However, she just had another TSH checked and this was slightly suppressed, at 0.38. - she continues on LT4 112 mcg daily - pt feels good on this dose, without thyrotoxic signs or symptoms.  She does have tremors, but these are longstanding for her. - will check thyroid  tests in 2 to 3 weeks: TSH and fT4 -if these are abnormal, we may need to decrease her levothyroxine  dose, but for now, due to previous variability in her TFTs, and the very mild decrease in TSH, we decided to continue the same dose. - I will see her back in 6 months  Orders Placed This Encounter  Procedures   TSH   T4, free   Lela Fendt, MD PhD [facility]

## 2024-05-31 ENCOUNTER — Encounter: Payer: Self-pay | Admitting: Internal Medicine

## 2024-05-31 MED ORDER — LEVOTHYROXINE SODIUM 112 MCG PO TABS: 112.0000 ug | ORAL_TABLET | Freq: Every day | ORAL | 0 refills | Status: AC

## 2024-09-06 ENCOUNTER — Other Ambulatory Visit

## 2024-09-07 ENCOUNTER — Ambulatory Visit: Payer: Self-pay | Admitting: Internal Medicine

## 2024-09-07 LAB — T4, FREE: Free T4: 1.4 ng/dL (ref 0.8–1.8)

## 2024-09-07 LAB — TSH: TSH: 2.76 m[IU]/L

## 2024-10-04 ENCOUNTER — Ambulatory Visit: Admitting: Internal Medicine
